# Patient Record
Sex: Female | Born: 1970
Health system: Southern US, Community
[De-identification: ages and names within clinical notes are randomized; demographics above are authoritative.]

## PROBLEM LIST (undated history)

## (undated) DIAGNOSIS — R112 Nausea with vomiting, unspecified: Secondary | ICD-10-CM

## (undated) DIAGNOSIS — E079 Disorder of thyroid, unspecified: Secondary | ICD-10-CM

## (undated) DIAGNOSIS — E785 Hyperlipidemia, unspecified: Secondary | ICD-10-CM

## (undated) DIAGNOSIS — I700 Atherosclerosis of aorta: Secondary | ICD-10-CM

## (undated) DIAGNOSIS — Z973 Presence of spectacles and contact lenses: Secondary | ICD-10-CM

## (undated) DIAGNOSIS — C801 Malignant (primary) neoplasm, unspecified: Secondary | ICD-10-CM

## (undated) DIAGNOSIS — E039 Hypothyroidism, unspecified: Secondary | ICD-10-CM

## (undated) DIAGNOSIS — R011 Cardiac murmur, unspecified: Secondary | ICD-10-CM

## (undated) DIAGNOSIS — Z9889 Other specified postprocedural states: Secondary | ICD-10-CM

## (undated) DIAGNOSIS — R918 Other nonspecific abnormal finding of lung field: Secondary | ICD-10-CM

## (undated) DIAGNOSIS — M1990 Unspecified osteoarthritis, unspecified site: Secondary | ICD-10-CM

## (undated) HISTORY — PX: EYE SURGERY: SHX253

## (undated) HISTORY — PX: WISDOM TOOTH EXTRACTION: SHX21

---

## 2005-05-16 ENCOUNTER — Encounter (HOSPITAL_COMMUNITY): Admission: RE | Admit: 2005-05-16 | Discharge: 2005-08-14 | Payer: Self-pay | Admitting: Endocrinology

## 2011-08-19 ENCOUNTER — Other Ambulatory Visit: Payer: Self-pay

## 2011-08-19 ENCOUNTER — Emergency Department (HOSPITAL_BASED_OUTPATIENT_CLINIC_OR_DEPARTMENT_OTHER)
Admission: EM | Admit: 2011-08-19 | Discharge: 2011-08-20 | Disposition: A | Discharge status: Home / Self Care | Payer: 59 | Attending: Emergency Medicine | Admitting: Emergency Medicine

## 2011-08-19 DIAGNOSIS — E079 Disorder of thyroid, unspecified: Secondary | ICD-10-CM | POA: Insufficient documentation

## 2011-08-19 DIAGNOSIS — E0590 Thyrotoxicosis, unspecified without thyrotoxic crisis or storm: Secondary | ICD-10-CM

## 2011-08-19 DIAGNOSIS — R002 Palpitations: Secondary | ICD-10-CM

## 2011-08-19 HISTORY — DX: Disorder of thyroid, unspecified: E07.9

## 2011-08-19 LAB — DIFFERENTIAL
Basophils Absolute: 0 10*3/uL (ref 0.0–0.1)
Eosinophils Absolute: 0.1 10*3/uL (ref 0.0–0.7)
Eosinophils Relative: 1 % (ref 0–5)
Lymphocytes Relative: 37 % (ref 12–46)
Lymphs Abs: 3.2 10*3/uL (ref 0.7–4.0)
Monocytes Absolute: 0.5 10*3/uL (ref 0.1–1.0)
Monocytes Relative: 6 % (ref 3–12)
Neutro Abs: 4.9 10*3/uL (ref 1.7–7.7)

## 2011-08-19 LAB — CBC
HCT: 35.4 % — ABNORMAL LOW (ref 36.0–46.0)
Hemoglobin: 11.9 g/dL — ABNORMAL LOW (ref 12.0–15.0)
MCH: 28.6 pg (ref 26.0–34.0)
RDW: 12.4 % (ref 11.5–15.5)
WBC: 8.7 10*3/uL (ref 4.0–10.5)

## 2011-08-19 LAB — BASIC METABOLIC PANEL
BUN: 11 mg/dL (ref 6–23)
GFR calc Af Amer: >90 mL/min (ref >90)

## 2011-08-19 NOTE — ED Notes (Signed)
C/o heart palpitations x 1 week-worse today-denies pain "just annoying"-hx of tx for hyperthyroid then hypothyroid

## 2011-08-19 NOTE — ED Provider Notes (Addendum)
History     CSN: [id number] Arrival date & time: 08/19/2011  8:54 PM   First MD Initiated Contact with Patient 08/19/11 2136      Chief Complaint  Patient presents with  . Palpitations    HPI  H/o hyperthyroidism s/p radioactive iodine therapy--> hypothyroidism on synthroid pw palpitations. Pt c/o palpitations x 1 week. Was seen by endo and TSH was low at that time. Decreased dose of synthroid. States that tonight her palpitations became worse but better now. Denies anxiety, diaphoresis, tremors. She reports recent 26lbs wt loss-- diet and exercise. Denies fever/chills, vomiting, nausea, vomiting, back pain. Denies h/o VTE in self or family. No recent hosp/surg/immob. No h/o cancer. Denies exogenous hormone use, no leg pain or swelling.  Past Medical History  Diagnosis Date  . Thyroid disease     History reviewed. No pertinent past surgical history.  No family history on file.  History  Substance Use Topics  . Smoking status: Never Smoker   . Smokeless tobacco: Not on file  . Alcohol Use: No    OB History    Grav Para Term Preterm Abortions TAB SAB Ect Mult Living                  Review of Systems  All other systems reviewed and are negative.   except as noted HPI  Allergies  Review of patient's allergies indicates no known allergies.  Home Medications  No current outpatient prescriptions on file.  BP 139/90  Pulse 79  Temp(Src) 98.4 F (36.9 C) (Oral)  Resp 20  Ht 5\' 4"  (1.626 m)  Wt 157 lb (71.215 kg)  BMI 26.95 kg/m2  SpO2 99%  LMP 08/18/2011  Physical Exam  Nursing note and vitals reviewed. Constitutional: She is oriented to person, place, and time. She appears well-developed.  HENT:  Head: Atraumatic.  Mouth/Throat: Oropharynx is clear and moist.  Eyes: Conjunctivae and EOM are normal. Pupils are equal, round, and reactive to light.  Neck: Normal range of motion. Neck supple.  Cardiovascular: Normal rate, regular rhythm, normal heart sounds  and intact distal pulses.   Pulmonary/Chest: Effort normal and breath sounds normal. No respiratory distress. She has no wheezes. She has no rales.  Abdominal: Soft. She exhibits no distension. There is no tenderness. There is no rebound and no guarding.  Musculoskeletal: Normal range of motion.  Neurological: She is alert and oriented to person, place, and time.  Skin: Skin is warm and dry. No rash noted.  Psychiatric: She has a normal mood and affect.    Date: 08/19/2011  Rate: 86  Rhythm: sinus arrhythmia  QRS Axis: normal  Intervals: normal  ST/T Wave abnormalities: normal  Conduction Disutrbances:none  Narrative Interpretation:   Old EKG Reviewed: none available  ED Course  Procedures (including critical care time)  Labs Reviewed  CBC - Abnormal; Notable for the following:    Hemoglobin 11.9 (*)    HCT 35.4 (*)    All other components within normal limits  BASIC METABOLIC PANEL - Abnormal; Notable for the following:    Glucose, Bld 105 (*)    All other components within normal limits  DIFFERENTIAL  PREGNANCY, URINE  T4, FREE   No results found.   1. Palpitations   2. Hyperthyroidism     MDM  Hyperthyroidism, palpitations. PERC negative for DVT. EKG without tachycardia. Precautions for return. Free T4 pending. F/U Endocrinology. BP improved without intervention.  BP 139/90  Pulse 79  Temp(Src) 98.4 F (36.9  C) (Oral)  Resp 20  Ht 5\' 4"  (1.626 m)  Wt 157 lb (71.215 kg)  BMI 26.95 kg/m2  SpO2 99%  LMP 08/18/2011   Stefano Gaul, MD         Forbes Cellar, MD 08/19/11 2358  Forbes Cellar, MD 08/19/11 2359

## 2012-09-26 DIAGNOSIS — H3589 Other specified retinal disorders: Secondary | ICD-10-CM

## 2012-09-26 DIAGNOSIS — C801 Malignant (primary) neoplasm, unspecified: Secondary | ICD-10-CM

## 2012-09-26 HISTORY — DX: Other specified retinal disorders: H35.89

## 2012-09-26 HISTORY — DX: Malignant (primary) neoplasm, unspecified: C80.1

## 2012-09-26 HISTORY — PX: EYE SURGERY: SHX253

## 2013-03-22 ENCOUNTER — Encounter (INDEPENDENT_AMBULATORY_CARE_PROVIDER_SITE_OTHER): Payer: 59 | Admitting: Ophthalmology

## 2013-03-22 DIAGNOSIS — H2510 Age-related nuclear cataract, unspecified eye: Secondary | ICD-10-CM

## 2013-03-22 DIAGNOSIS — S0550XA Penetrating wound with foreign body of unspecified eyeball, initial encounter: Secondary | ICD-10-CM

## 2013-03-22 DIAGNOSIS — C6930 Malignant neoplasm of unspecified choroid: Secondary | ICD-10-CM

## 2013-03-22 DIAGNOSIS — H33309 Unspecified retinal break, unspecified eye: Secondary | ICD-10-CM

## 2013-03-22 DIAGNOSIS — H43819 Vitreous degeneration, unspecified eye: Secondary | ICD-10-CM

## 2013-03-27 ENCOUNTER — Other Ambulatory Visit: Payer: Self-pay | Admitting: Internal Medicine

## 2013-03-27 ENCOUNTER — Other Ambulatory Visit (HOSPITAL_COMMUNITY): Payer: Self-pay | Admitting: Internal Medicine

## 2013-03-27 ENCOUNTER — Ambulatory Visit (HOSPITAL_COMMUNITY)
Admission: RE | Admit: 2013-03-27 | Discharge: 2013-03-27 | Disposition: A | Discharge status: Home / Self Care | Payer: 59 | Source: Ambulatory Visit | Attending: Internal Medicine | Admitting: Internal Medicine

## 2013-03-27 DIAGNOSIS — C439 Malignant melanoma of skin, unspecified: Secondary | ICD-10-CM

## 2013-03-27 DIAGNOSIS — C6930 Malignant neoplasm of unspecified choroid: Secondary | ICD-10-CM

## 2013-03-27 DIAGNOSIS — C4359 Malignant melanoma of other part of trunk: Secondary | ICD-10-CM | POA: Insufficient documentation

## 2013-04-01 ENCOUNTER — Ambulatory Visit (INDEPENDENT_AMBULATORY_CARE_PROVIDER_SITE_OTHER): Payer: 59 | Admitting: Ophthalmology

## 2013-04-01 ENCOUNTER — Ambulatory Visit
Admission: RE | Admit: 2013-04-01 | Discharge: 2013-04-01 | Disposition: A | Discharge status: Home / Self Care | Payer: 59 | Source: Ambulatory Visit | Attending: Internal Medicine | Admitting: Internal Medicine

## 2013-04-01 ENCOUNTER — Other Ambulatory Visit: Payer: Self-pay | Admitting: Internal Medicine

## 2013-04-01 DIAGNOSIS — C6930 Malignant neoplasm of unspecified choroid: Secondary | ICD-10-CM

## 2013-04-01 DIAGNOSIS — D219 Benign neoplasm of connective and other soft tissue, unspecified: Secondary | ICD-10-CM

## 2013-04-01 MED ORDER — IOHEXOL 300 MG/ML  SOLN
100.0000 mL | Freq: Once | INTRAMUSCULAR | Status: AC | PRN
  Administered 2013-04-01: 100 mL via INTRAVENOUS

## 2013-04-02 ENCOUNTER — Ambulatory Visit (INDEPENDENT_AMBULATORY_CARE_PROVIDER_SITE_OTHER): Payer: 59 | Admitting: Ophthalmology

## 2013-04-03 ENCOUNTER — Other Ambulatory Visit: Payer: Self-pay | Admitting: Internal Medicine

## 2013-04-03 DIAGNOSIS — C6932 Malignant neoplasm of left choroid: Secondary | ICD-10-CM

## 2013-04-04 ENCOUNTER — Ambulatory Visit (INDEPENDENT_AMBULATORY_CARE_PROVIDER_SITE_OTHER): Payer: 59 | Admitting: Ophthalmology

## 2013-04-05 ENCOUNTER — Ambulatory Visit (INDEPENDENT_AMBULATORY_CARE_PROVIDER_SITE_OTHER): Payer: 59 | Admitting: Ophthalmology

## 2013-04-05 DIAGNOSIS — H33309 Unspecified retinal break, unspecified eye: Secondary | ICD-10-CM

## 2013-04-06 ENCOUNTER — Ambulatory Visit
Admission: RE | Admit: 2013-04-06 | Discharge: 2013-04-06 | Disposition: A | Discharge status: Home / Self Care | Payer: 59 | Source: Ambulatory Visit | Attending: Internal Medicine | Admitting: Internal Medicine

## 2013-04-06 DIAGNOSIS — D219 Benign neoplasm of connective and other soft tissue, unspecified: Secondary | ICD-10-CM

## 2013-04-06 MED ORDER — GADOBENATE DIMEGLUMINE 529 MG/ML IV SOLN
14.0000 mL | Freq: Once | INTRAVENOUS | Status: AC | PRN
  Administered 2013-04-06: 14 mL via INTRAVENOUS

## 2013-04-07 ENCOUNTER — Ambulatory Visit
Admission: RE | Admit: 2013-04-07 | Discharge: 2013-04-07 | Disposition: A | Discharge status: Home / Self Care | Payer: 59 | Source: Ambulatory Visit | Attending: Internal Medicine | Admitting: Internal Medicine

## 2013-04-07 DIAGNOSIS — C6932 Malignant neoplasm of left choroid: Secondary | ICD-10-CM

## 2013-04-07 MED ORDER — GADOBENATE DIMEGLUMINE 529 MG/ML IV SOLN
14.0000 mL | Freq: Once | INTRAVENOUS | Status: AC | PRN
  Administered 2013-04-07: 14 mL via INTRAVENOUS

## 2013-04-18 DIAGNOSIS — F40240 Claustrophobia: Secondary | ICD-10-CM | POA: Insufficient documentation

## 2013-04-18 DIAGNOSIS — E890 Postprocedural hypothyroidism: Secondary | ICD-10-CM | POA: Insufficient documentation

## 2013-08-09 ENCOUNTER — Ambulatory Visit (INDEPENDENT_AMBULATORY_CARE_PROVIDER_SITE_OTHER): Payer: 59 | Admitting: Ophthalmology

## 2013-08-09 DIAGNOSIS — C6990 Malignant neoplasm of unspecified site of unspecified eye: Secondary | ICD-10-CM

## 2013-08-09 DIAGNOSIS — H43819 Vitreous degeneration, unspecified eye: Secondary | ICD-10-CM

## 2013-08-09 DIAGNOSIS — H33309 Unspecified retinal break, unspecified eye: Secondary | ICD-10-CM

## 2013-08-09 DIAGNOSIS — H2510 Age-related nuclear cataract, unspecified eye: Secondary | ICD-10-CM

## 2013-08-27 ENCOUNTER — Encounter (HOSPITAL_COMMUNITY): Payer: Self-pay

## 2013-09-03 DIAGNOSIS — L409 Psoriasis, unspecified: Secondary | ICD-10-CM | POA: Insufficient documentation

## 2013-09-09 ENCOUNTER — Encounter (HOSPITAL_COMMUNITY): Payer: Self-pay | Admitting: Pharmacist

## 2013-09-10 NOTE — H&P (Signed)
Tracy Kidd is an 42 y.o. female. G0 who comes in for scheduled laparoscopy for a persistent pelvic mass noted since 7/14.  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  On the MRI, a cystic mass was noted in the right pelvis which radiologically favored an endometrioma.  The patient has been followed carefully with serial ultrasounds and the mass has remained stable in size, but cannot be definitively defined.  Her periods have been for the most part regular, every 28 days.  She did have one abnormal cycle in October, but that resolved.  She has no pelvic pain from the mass or her other multiple fibroids.  A SIUS ruled out any intracavitary fibroids. She has not had children by choice.   Pertinent Gynecological History: OB History: none   Menstrual History:  Patient's last menstrual period was 08/06/2013.    Past Medical History  Diagnosis Date  . Thyroid disease   . Hypothyroidism   . Arthritis     hands and feet  . PONV (postoperative nausea and vomiting)     past surgery Scop patch and propofol helped  . Cancer     ocular melanoma    Past Surgical History  Procedure Laterality Date  . Wisdom tooth extraction    . Eye surgery      radiation plaque placement and removal    History reviewed. No pertinent family history.  Social History:  reports that she has never smoked. She does not have any smokeless tobacco history on file. She reports that she drinks alcohol. She reports that she does not use illicit drugs.  Allergies:  Allergies  Allergen Reactions  . Oxycodone Nausea And Vomiting    Can take morphine and possibly Vicodin    No prescriptions prior to admission    Review of Systems  Constitutional: Negative for fever.  Genitourinary: Negative for dysuria.    Height 5\' 3"  (1.6 m), weight 69.4 kg (153 lb), last menstrual period 08/06/2013. Physical Exam  Constitutional: She is oriented to person, place, and time.  She appears well-developed and well-nourished.  Cardiovascular: Normal rate and regular rhythm.   Respiratory: Effort normal.  GI: Soft. Bowel sounds are normal.  Genitourinary: Vagina normal and uterus normal.  Neurological: She is alert and oriented to person, place, and time.  Psychiatric: She has a normal mood and affect.    No results found for this or any previous visit (from the past 24 hour(s)).  No results found.  Assessment/Plan: d/w pt her laparoscopy in detail. d/w her risks and benefits including bleeding, infection, and possible damage to bowel and bladder with possible prolonged recovery and need for a larger incision should a complication arise. If endometriosis is identified, we will try to cauterize and remove it at time of surgery. We discussed it is not clear what the pelvic mass represents, if it is an atypical pedunculated fibroid we will likely leave in place. If ovarian or tubal in nature, we will remove. Pt desires to proceed   Oliver Pila 09/10/2013, 7:05 PM

## 2013-09-13 ENCOUNTER — Encounter (HOSPITAL_COMMUNITY)
Admission: RE | Disposition: A | Discharge status: Home / Self Care | Payer: Self-pay | Source: Ambulatory Visit | Attending: Obstetrics and Gynecology

## 2013-09-13 ENCOUNTER — Encounter (HOSPITAL_COMMUNITY): Payer: Self-pay | Admitting: *Deleted

## 2013-09-13 ENCOUNTER — Ambulatory Visit (HOSPITAL_COMMUNITY): Payer: 59 | Admitting: Anesthesiology

## 2013-09-13 ENCOUNTER — Ambulatory Visit (HOSPITAL_COMMUNITY)
Admission: RE | Admit: 2013-09-13 | Discharge: 2013-09-13 | Disposition: A | Discharge status: Home / Self Care | Payer: 59 | Source: Ambulatory Visit | Attending: Obstetrics and Gynecology | Admitting: Obstetrics and Gynecology

## 2013-09-13 ENCOUNTER — Encounter (HOSPITAL_COMMUNITY): Payer: 59 | Admitting: Anesthesiology

## 2013-09-13 DIAGNOSIS — C7250 Malignant neoplasm of unspecified cranial nerve: Secondary | ICD-10-CM | POA: Insufficient documentation

## 2013-09-13 DIAGNOSIS — D259 Leiomyoma of uterus, unspecified: Secondary | ICD-10-CM | POA: Insufficient documentation

## 2013-09-13 DIAGNOSIS — R1900 Intra-abdominal and pelvic swelling, mass and lump, unspecified site: Secondary | ICD-10-CM | POA: Insufficient documentation

## 2013-09-13 DIAGNOSIS — N808 Other endometriosis: Secondary | ICD-10-CM | POA: Insufficient documentation

## 2013-09-13 HISTORY — DX: Unspecified osteoarthritis, unspecified site: M19.90

## 2013-09-13 HISTORY — DX: Nausea with vomiting, unspecified: R11.2

## 2013-09-13 HISTORY — DX: Hypothyroidism, unspecified: E03.9

## 2013-09-13 HISTORY — DX: Malignant (primary) neoplasm, unspecified: C80.1

## 2013-09-13 HISTORY — PX: LAPAROSCOPY: SHX197

## 2013-09-13 HISTORY — DX: Other specified postprocedural states: Z98.890

## 2013-09-13 LAB — CBC
MCHC: 33.6 g/dL (ref 30.0–36.0)
Platelets: 253 10*3/uL (ref 150–400)
RDW: 12.6 % (ref 11.5–15.5)

## 2013-09-13 SURGERY — LAPAROSCOPY OPERATIVE
Anesthesia: General | Site: Abdomen

## 2013-09-13 MED ORDER — METHYLENE BLUE 1 % INJ SOLN
INTRAMUSCULAR | Status: AC
  Filled 2013-09-13: qty 10

## 2013-09-13 MED ORDER — FENTANYL CITRATE 0.05 MG/ML IJ SOLN
25.0000 ug | INTRAMUSCULAR | Status: DC | PRN
  Administered 2013-09-13: 50 ug via INTRAVENOUS
  Administered 2013-09-13: 25 ug via INTRAVENOUS

## 2013-09-13 MED ORDER — FENTANYL CITRATE 0.05 MG/ML IJ SOLN
INTRAMUSCULAR | Status: AC
  Filled 2013-09-13: qty 2

## 2013-09-13 MED ORDER — BUPIVACAINE HCL (PF) 0.25 % IJ SOLN
INTRAMUSCULAR | Status: DC | PRN
  Administered 2013-09-13: 14 mL

## 2013-09-13 MED ORDER — LACTATED RINGERS IV SOLN: INTRAVENOUS | Status: DC

## 2013-09-13 MED ORDER — SCOPOLAMINE 1 MG/3DAYS TD PT72
MEDICATED_PATCH | TRANSDERMAL | Status: AC
  Filled 2013-09-13: qty 1

## 2013-09-13 MED ORDER — IBUPROFEN 200 MG PO TABS: 600.0000 mg | ORAL_TABLET | Freq: Four times a day (QID) | ORAL | Status: DC | PRN

## 2013-09-13 MED ORDER — ONDANSETRON HCL 4 MG/2ML IJ SOLN
INTRAMUSCULAR | Status: DC | PRN
  Administered 2013-09-13: 4 mg via INTRAVENOUS

## 2013-09-13 MED ORDER — HEPARIN SODIUM (PORCINE) 5000 UNIT/ML IJ SOLN
INTRAMUSCULAR | Status: AC
  Filled 2013-09-13: qty 1

## 2013-09-13 MED ORDER — PROPOFOL 10 MG/ML IV BOLUS
INTRAVENOUS | Status: DC | PRN
  Administered 2013-09-13: 200 mg via INTRAVENOUS

## 2013-09-13 MED ORDER — KETOROLAC TROMETHAMINE 30 MG/ML IJ SOLN
INTRAMUSCULAR | Status: DC | PRN
  Administered 2013-09-13: 30 mg via INTRAVENOUS

## 2013-09-13 MED ORDER — SODIUM CHLORIDE 0.9 % IJ SOLN
INTRAMUSCULAR | Status: AC
  Filled 2013-09-13: qty 10

## 2013-09-13 MED ORDER — METOCLOPRAMIDE HCL 5 MG/ML IJ SOLN
INTRAMUSCULAR | Status: AC
  Filled 2013-09-13: qty 2

## 2013-09-13 MED ORDER — FENTANYL CITRATE 0.05 MG/ML IJ SOLN
INTRAMUSCULAR | Status: AC
  Filled 2013-09-13: qty 5

## 2013-09-13 MED ORDER — DEXAMETHASONE SODIUM PHOSPHATE 10 MG/ML IJ SOLN
INTRAMUSCULAR | Status: DC | PRN
  Administered 2013-09-13: 10 mg via INTRAVENOUS

## 2013-09-13 MED ORDER — BUPIVACAINE HCL (PF) 0.25 % IJ SOLN
INTRAMUSCULAR | Status: AC
  Filled 2013-09-13: qty 30

## 2013-09-13 MED ORDER — GLYCOPYRROLATE 0.2 MG/ML IJ SOLN
INTRAMUSCULAR | Status: DC | PRN
  Administered 2013-09-13: 0.4 mg via INTRAVENOUS
  Administered 2013-09-13: .2 mg via INTRAVENOUS

## 2013-09-13 MED ORDER — LIDOCAINE HCL (CARDIAC) 20 MG/ML IV SOLN
INTRAVENOUS | Status: DC | PRN
  Administered 2013-09-13: 100 mg via INTRAVENOUS

## 2013-09-13 MED ORDER — MIDAZOLAM HCL 2 MG/2ML IJ SOLN
INTRAMUSCULAR | Status: AC
  Filled 2013-09-13: qty 2

## 2013-09-13 MED ORDER — MEPERIDINE HCL 25 MG/ML IJ SOLN: 6.2500 mg | INTRAMUSCULAR | Status: DC | PRN

## 2013-09-13 MED ORDER — MIDAZOLAM HCL 2 MG/2ML IJ SOLN
INTRAMUSCULAR | Status: DC | PRN
  Administered 2013-09-13: 2 mg via INTRAVENOUS

## 2013-09-13 MED ORDER — FENTANYL CITRATE 0.05 MG/ML IJ SOLN
INTRAMUSCULAR | Status: DC | PRN
  Administered 2013-09-13 (×2): 100 ug via INTRAVENOUS
  Administered 2013-09-13 (×2): 50 ug via INTRAVENOUS

## 2013-09-13 MED ORDER — LACTATED RINGERS IV SOLN
INTRAVENOUS | Status: DC
  Administered 2013-09-13 (×4): via INTRAVENOUS

## 2013-09-13 MED ORDER — NEOSTIGMINE METHYLSULFATE 1 MG/ML IJ SOLN
INTRAMUSCULAR | Status: DC | PRN
  Administered 2013-09-13 (×2): 2 mg via INTRAVENOUS

## 2013-09-13 MED ORDER — HYDROCODONE-ACETAMINOPHEN 5-325 MG PO TABS: 1.0000 | ORAL_TABLET | Freq: Four times a day (QID) | ORAL | Status: DC | PRN

## 2013-09-13 MED ORDER — FENTANYL CITRATE 0.05 MG/ML IJ SOLN
INTRAMUSCULAR | Status: AC
  Administered 2013-09-13: 25 ug via INTRAVENOUS
  Filled 2013-09-13: qty 2

## 2013-09-13 MED ORDER — ROCURONIUM BROMIDE 100 MG/10ML IV SOLN
INTRAVENOUS | Status: DC | PRN
  Administered 2013-09-13: 40 mg via INTRAVENOUS

## 2013-09-13 MED ORDER — METOCLOPRAMIDE HCL 5 MG/ML IJ SOLN
10.0000 mg | Freq: Once | INTRAMUSCULAR | Status: AC | PRN
  Administered 2013-09-13: 10 mg via INTRAVENOUS

## 2013-09-13 SURGICAL SUPPLY — 31 items
CABLE HIGH FREQUENCY MONO STRZ (ELECTRODE) IMPLANT
CATH ROBINSON RED A/P 16FR (CATHETERS) IMPLANT
CHLORAPREP W/TINT 26ML (MISCELLANEOUS) ×2 IMPLANT
CLOTH BEACON ORANGE TIMEOUT ST (SAFETY) ×2 IMPLANT
DECANTER SPIKE VIAL GLASS SM (MISCELLANEOUS) ×2 IMPLANT
DERMABOND ADVANCED (GAUZE/BANDAGES/DRESSINGS) ×1
DERMABOND ADVANCED .7 DNX12 (GAUZE/BANDAGES/DRESSINGS) ×1 IMPLANT
DRSG COVADERM PLUS 2X2 (GAUZE/BANDAGES/DRESSINGS) ×2 IMPLANT
GLOVE BIO SURGEON STRL SZ 6.5 (GLOVE) ×2 IMPLANT
GLOVE BIO SURGEON STRL SZ8 (GLOVE) ×2 IMPLANT
GOWN PREVENTION PLUS LG XLONG (DISPOSABLE) ×4 IMPLANT
NEEDLE INSUFFLATION 120MM (ENDOMECHANICALS) ×2 IMPLANT
NS IRRIG 1000ML POUR BTL (IV SOLUTION) IMPLANT
PACK LAPAROSCOPY BASIN (CUSTOM PROCEDURE TRAY) ×2 IMPLANT
POUCH SPECIMEN RETRIEVAL 10MM (ENDOMECHANICALS) ×2 IMPLANT
PROTECTOR NERVE ULNAR (MISCELLANEOUS) ×2 IMPLANT
SCALPEL HARMONIC ACE (MISCELLANEOUS) ×2 IMPLANT
SET IRRIG TUBING LAPAROSCOPIC (IRRIGATION / IRRIGATOR) ×2 IMPLANT
SOLUTION ELECTROLUBE (MISCELLANEOUS) IMPLANT
SUT VIC AB 2-0 SH 27 (SUTURE) ×1
SUT VIC AB 2-0 SH 27XBRD (SUTURE) ×1 IMPLANT
SUT VIC AB 3-0 PS2 18 (SUTURE) ×2
SUT VIC AB 3-0 PS2 18XBRD (SUTURE) ×2 IMPLANT
SUT VICRYL 0 UR6 27IN ABS (SUTURE) ×2 IMPLANT
TOWEL OR 17X24 6PK STRL BLUE (TOWEL DISPOSABLE) ×4 IMPLANT
TRAY FOLEY CATH 14FR (SET/KITS/TRAYS/PACK) ×2 IMPLANT
TROCAR XCEL NON-BLD 11X100MML (ENDOMECHANICALS) IMPLANT
TROCAR XCEL NON-BLD 5MMX100MML (ENDOMECHANICALS) ×2 IMPLANT
TROCAR XCEL OPT SLVE 5M 100M (ENDOMECHANICALS) ×2 IMPLANT
WARMER LAPAROSCOPE (MISCELLANEOUS) ×2 IMPLANT
WATER STERILE IRR 1000ML POUR (IV SOLUTION) ×2 IMPLANT

## 2013-09-13 NOTE — Brief Op Note (Signed)
09/13/2013  12:51 PM  PATIENT:  Noland Fordyce  42 y.o. female  PRE-OPERATIVE DIAGNOSIS:  Pelvic mass  POST-OPERATIVE DIAGNOSIS:  Pelvic mass  PROCEDURE:  Procedure(s): LAPAROSCOPY OPERATIVE, removal of pelvic mass (N/A)  SURGEON:  Surgeon(s) and Role:    * Oliver Pila, MD - Primary      Lavina Hamman, MD  ANESTHESIA:   local and general  EBL:  Total I/O In: 1500 [I.V.:1500] Out: 150 [Urine:125; Blood:25]  BLOOD ADMINISTERED:none  DRAINS: Urinary Catheter (Foley)   LOCAL MEDICATIONS USED:  MARCAINE     SPECIMEN:  Pelvic mass  DISPOSITION OF SPECIMEN:  PATHOLOGY  COUNTS:  YES  TOURNIQUET:  * No tourniquets in log *  DICTATION: .Dragon Dictation  PLAN OF CARE: Discharge to home after PACU  PATIENT DISPOSITION:  PACU - hemodynamically stable.

## 2013-09-13 NOTE — Anesthesia Procedure Notes (Signed)
Procedure Name: Intubation Date/Time: 09/13/2013 10:37 AM Performed by: Faelynn Wynder, Jannet Askew Pre-anesthesia Checklist: Patient identified, Emergency Drugs available, Timeout performed, Suction available and Patient being monitored Patient Re-evaluated:Patient Re-evaluated prior to inductionOxygen Delivery Method: Circle system utilized Preoxygenation: Pre-oxygenation with 100% oxygen Intubation Type: IV induction Ventilation: Mask ventilation without difficulty Grade View: Grade I Tube size: 7.0 mm Number of attempts: 1 Placement Confirmation: ETT inserted through vocal cords under direct vision,  breath sounds checked- equal and bilateral and positive ETCO2 Secured at: 31 cm Dental Injury: Teeth and Oropharynx as per pre-operative assessment

## 2013-09-13 NOTE — Anesthesia Postprocedure Evaluation (Signed)
  Anesthesia Post-op Note  Patient: Tracy Kidd  Procedure(s) Performed: Procedure(s): LAPAROSCOPY OPERATIVE, removal of pelvic mass (N/A)  Patient Location: PACU  Anesthesia Type:General  Level of Consciousness: awake, alert  and oriented  Airway and Oxygen Therapy: Patient Spontanous Breathing  Post-op Pain: none  Post-op Assessment: Post-op Vital signs reviewed, Patient's Cardiovascular Status Stable, Respiratory Function Stable, Patent Airway, No signs of Nausea or vomiting and Pain level controlled  Post-op Vital Signs: Reviewed and stable  Complications: No apparent anesthesia complications

## 2013-09-13 NOTE — Op Note (Signed)
Operative note  Preoperative diagnosis Pelvic mass  Postoperative diagnosis Pedunculated fibroid with endometrioma by clinical impression  Procedure Operative laparoscopy with resection of pelvic mass  Surgeon Dr. Huel Cote Dr. Tawanna Cooler Meisinger  Anesthesia Gen.  Fluids Estimated blood loss minimal Urine output 100 cc clear IV fluids 1200 cc LR  Findings There was a pedunculated mass coming off the right anterior surface of the uterus. This then appeared both cystic and solid in nature. The clinical impression was a pedunculated fibroid with endometrioma that was adherent to the anterior, wall. Bilateral ovaries and tubes appeared normal. The cul-de-sac was clear of disease. The abdomen and pelvis otherwise appeared normal except for some mild colonic adhesions to the left pelvic sidewall.  Specimen Pelvic mass resected and sent to pathology  Procedure Patient was taken to the operating room where general anesthesia was obtained without difficulty. She was prepped and draped in the normal sterile fashion in the dorsal lithotomy position. An appropriate time out was performed. A speculum was placed in the vagina the acorn tenaculum placed within the uterus. Attention was then turned to the patient's abdomen where an infraumbilical incision was made with the scalpel and ANA varies needle introduced into the incision. Intraperitoneal placement was confirmed with both aspiration and injection with normal saline. Gas was then applied and pneumoperitoneum obtained with approximately 2 and half liters of CO2 gas. Varies needle was then removed and the 11 trocar placed within the incision using Opti-Vu visualization. With the camera in the abdomen and the pelvis and abdomen were inspected with findings as previously stated. 2 additional 5 mm ports were placed in the upper lateral quadrants under direct visualization. Each port site was injected with quarter percent Marcaine before  placement. With the instruments in place pelvic mass was resected off the anterior abdominal wall with the harmonic scalpel. In freeing it from the abdominal wall there was a rupture of a chocolate cyst with apparent endometrioma fluid released into the abdomen. The remainder of the mass stayed intact. And the mass was also attached to the uterus from a pedunculated stalk and this was taken down with the harmonic scalpel as well to completely free the mass. Once this was completed the pelvis was copiously irrigated and the endometrioma fluid suctioned out. The uterus was inspected and no active bleeding noted from the site of removal. The cul-de-sac and the remainder of the pelvis was normal. Therefore the scope was changed to 5 mm and an Endobag introduced into the umbilical port. The mass was placed within the Endobag without difficulty and it was brought up to the level of the umbilicus. The trocar was removed and the bag pulled through the umbilical port site. Because of the solid component of the mass it could not be completely removed intact. The mass was morcellated within the bag partially and was then able to be pulled through the port site where it was removed from just underneath the skin with a towel clip. This was handed off to pathology. Attention was then turned to the umbilical port site where the skin incision had to be extended slightly in order to adequately close the fascia. The fascia was closed with 0 Vicryl in a pursestring fashion while watching with the 5 mm scope. The subcutaneous tissue was reapproximated with 2-0 Vicryl in a running fashion. The skin was closed with 4-0 Vicryl in a subcuticular stitch. The other ports were removed under direct visualization and the pneumoperitoneum reduced through the final port before its  removal. These port sites were then likewise closed with a subcuticular stitch of 4-0 Vicryl and reinforced with Dermabond. The acorn tenaculum was removed from the  vagina and the patient was awakened and taken to the recovery room in good condition. All instruments and sponge counts were correct.

## 2013-09-13 NOTE — Progress Notes (Signed)
Patient ID: Tracy Kidd, female   DOB: 04-07-1971, 42 y.o.   MRN: [id number] Per pt no changes in dictated H&P, brief exam WNL.

## 2013-09-13 NOTE — Anesthesia Preprocedure Evaluation (Signed)
Anesthesia Evaluation  Patient identified by MRN, date of birth, ID band Patient awake    Reviewed: Allergy & Precautions, H&P , NPO status , Patient's Chart, lab work & pertinent test results  History of Anesthesia Complications (+) PONV and history of anesthetic complications  Airway Mallampati: II TM Distance: >3 FB Neck ROM: Full    Dental no notable dental hx. (+) Teeth Intact   Pulmonary neg pulmonary ROS,  breath sounds clear to auscultation  Pulmonary exam normal       Cardiovascular negative cardio ROS  Rhythm:Regular Rate:Normal     Neuro/Psych negative neurological ROS  negative psych ROS   GI/Hepatic negative GI ROS, Neg liver ROS,   Endo/Other  Hypothyroidism   Renal/GU negative Renal ROS  negative genitourinary   Musculoskeletal negative musculoskeletal ROS (+)   Abdominal   Peds  Hematology negative hematology ROS (+)   Anesthesia Other Findings   Reproductive/Obstetrics Pelvic Mass                           Anesthesia Physical Anesthesia Plan  ASA: II  Anesthesia Plan: General   Post-op Pain Management:    Induction: Intravenous  Airway Management Planned: Natural Airway  Additional Equipment:   Intra-op Plan:   Post-operative Plan: Extubation in OR  Informed Consent: I have reviewed the patients History and Physical, chart, labs and discussed the procedure including the risks, benefits and alternatives for the proposed anesthesia with the patient or authorized representative who has indicated his/her understanding and acceptance.   Dental advisory given  Plan Discussed with: CRNA, Anesthesiologist and Surgeon  Anesthesia Plan Comments:         Anesthesia Quick Evaluation

## 2013-09-13 NOTE — Transfer of Care (Signed)
Immediate Anesthesia Transfer of Care Note  Patient: Tracy Kidd  Procedure(s) Performed: Procedure(s): LAPAROSCOPY OPERATIVE, removal of pelvic mass (N/A)  Patient Location: PACU  Anesthesia Type:General  Level of Consciousness: awake, alert  and oriented  Airway & Oxygen Therapy: Patient Spontanous Breathing and Patient connected to nasal cannula oxygen  Post-op Assessment: Report given to PACU RN and Post -op Vital signs reviewed and stable  Post vital signs: Reviewed and stable  Complications: No apparent anesthesia complications

## 2013-09-16 ENCOUNTER — Encounter (HOSPITAL_COMMUNITY): Payer: Self-pay | Admitting: Obstetrics and Gynecology

## 2013-09-16 LAB — HCG, QUANTITATIVE, PREGNANCY: hCG, Beta Chain, Quant, S: <1 m[IU]/mL (ref <5)

## 2013-10-10 ENCOUNTER — Other Ambulatory Visit: Payer: Self-pay | Admitting: Dermatology

## 2013-12-10 DIAGNOSIS — T380X5A Adverse effect of glucocorticoids and synthetic analogues, initial encounter: Secondary | ICD-10-CM | POA: Insufficient documentation

## 2013-12-10 DIAGNOSIS — H4060X0 Glaucoma secondary to drugs, unspecified eye, stage unspecified: Secondary | ICD-10-CM | POA: Insufficient documentation

## 2014-01-26 DIAGNOSIS — C6930 Malignant neoplasm of unspecified choroid: Secondary | ICD-10-CM | POA: Insufficient documentation

## 2014-01-26 DIAGNOSIS — E039 Hypothyroidism, unspecified: Secondary | ICD-10-CM | POA: Insufficient documentation

## 2014-01-28 ENCOUNTER — Encounter: Payer: Self-pay | Admitting: Physician Assistant

## 2014-01-28 ENCOUNTER — Ambulatory Visit (INDEPENDENT_AMBULATORY_CARE_PROVIDER_SITE_OTHER): Payer: 59 | Admitting: Physician Assistant

## 2014-01-28 VITALS — BP 118/68 | HR 72 | Temp 98.1°F | Resp 16 | Wt 161.0 lb

## 2014-01-28 DIAGNOSIS — Z0000 Encounter for general adult medical examination without abnormal findings: Secondary | ICD-10-CM

## 2014-01-28 LAB — HEMOGLOBIN A1C
HEMOGLOBIN A1C: 5.2 % (ref <5.7)
Mean Plasma Glucose: 103 mg/dL (ref <117)

## 2014-01-28 LAB — CBC WITH DIFFERENTIAL/PLATELET
Basophils Absolute: 0 10*3/uL (ref 0.0–0.1)
Basophils Relative: 0 % (ref 0–1)
EOS ABS: 0.1 10*3/uL (ref 0.0–0.7)
Eosinophils Relative: 1 % (ref 0–5)
HCT: 36.9 % (ref 36.0–46.0)
HEMOGLOBIN: 12.4 g/dL (ref 12.0–15.0)
Lymphocytes Relative: 34 % (ref 12–46)
Lymphs Abs: 2.4 10*3/uL (ref 0.7–4.0)
MCH: 28.4 pg (ref 26.0–34.0)
MCHC: 33.6 g/dL (ref 30.0–36.0)
MCV: 84.4 fL (ref 78.0–100.0)
Monocytes Absolute: 0.4 10*3/uL (ref 0.1–1.0)
Monocytes Relative: 5 % (ref 3–12)
NEUTROS PCT: 60 % (ref 43–77)
Neutro Abs: 4.3 10*3/uL (ref 1.7–7.7)
Platelets: 288 10*3/uL (ref 150–400)
RBC: 4.37 MIL/uL (ref 3.87–5.11)
RDW: 13.6 % (ref 11.5–15.5)
WBC: 7.1 10*3/uL (ref 4.0–10.5)

## 2014-01-28 NOTE — Patient Instructions (Signed)

## 2014-01-28 NOTE — Progress Notes (Signed)
Complete Physical HPI 43 y.o. female  presents for a complete physical. Her blood pressure has been controlled at home, today their BP is BP: 118/68 mmHg She does workout, walking. She denies chest pain, shortness of breath, dizziness.  She is not on cholesterol medication and denies myalgias. Her cholesterol is at goal. The cholesterol last visit was:  LDL 97  Last A1C in the office was: 5.2 Patient is on Vitamin D supplement.  Vitamin D 41(26) , B12 306 She was diagnosed with ocular melanoma and has been seeing a specialist at [facility], she had radiation and recently had increased pressure and seeing Dr. Celene Squibb.  She is also seeing Dr. Martinique every 3 months for skin checks.  She had laparoscopic surgery in Dec 2014, with Dr. Marvel Plan for endometriom on her ovary.   Current Medications:  Current Outpatient Prescriptions on File Prior to Visit  Medication Sig Dispense Refill  . B Complex Vitamins (VITAMIN B COMPLEX PO) Take 1 tablet by mouth daily.      Marland Kitchen CALCIUM PO Take 1 tablet by mouth daily.      . cholecalciferol (VITAMIN D) 1000 UNITS tablet Take 2,000 Units by mouth daily.      . Cyanocobalamin (VITAMIN B-12 PO) Take 1 tablet by mouth daily.      . fexofenadine (ALLEGRA) 180 MG tablet Take 180 mg by mouth daily.      . fluocinonide cream (LIDEX) 6.29 % Apply 1 application topically daily as needed (for eczema flare ups).      . IRON PO Take 1 tablet by mouth daily.      Marland Kitchen levothyroxine (SYNTHROID, LEVOTHROID) 112 MCG tablet Take 112 mcg by mouth daily before breakfast.      . MELATONIN PO Take 1 tablet by mouth at bedtime.      . Multiple Vitamin (MULTIVITAMIN WITH MINERALS) TABS tablet Take 1 tablet by mouth daily.       No current facility-administered medications on file prior to visit.   Health Maintenance:   Immunization History  Administered Date(s) Administered  . Tdap 01/29/2007   Tetanus:2008 Pneumovax: N/A Flu vaccine: 2014 Zostavax: N/A Pap: 2013- every 3  years MGM: 12/2013 DEXA: N/A Colonoscopy: EGD:  Allergies:  Allergies  Allergen Reactions  . Oxycodone Nausea And Vomiting    Can take morphine and possibly Vicodin   Medical History:  Past Medical History  Diagnosis Date  . PONV (postoperative nausea and vomiting)     past surgery Scop patch and propofol helped  . Arthritis     hands and feet  . Cancer     ocular melanoma  . Thyroid disease   . Hypothyroidism    Surgical History:  Past Surgical History  Procedure Laterality Date  . Wisdom tooth extraction    . Eye surgery      radiation plaque placement and removal  . Laparoscopy N/A 09/13/2013    Procedure: LAPAROSCOPY OPERATIVE, removal of pelvic mass;  Surgeon: Logan Bores, MD;  Location: [facility];  Service: Gynecology;  Laterality: N/A;   Family History: No family history on file. Social History:  History  Substance Use Topics  . Smoking status: Never Smoker   . Smokeless tobacco: Not on file  . Alcohol Use: Yes     Comment: rare     Review of Systems: [X]  = complains of  [ ]  = denies  General: Fatigue [ ]  Fever [ ]  Chills [ ]  Weakness [ ]   Insomnia [ ] Weight change [ ]   Night sweats [ ]   Change in appetite [ ]  Eyes: Redness [ ]  Blurred vision [ ]  Diplopia [ ]  Discharge [ ]   ENT: Congestion [ ]  Sinus Pain [ ]  Post Nasal Drip [ ]  Sore Throat [ ]  Earache [ ]  hearing loss [ ]  Tinnitus [ ]  Snoring [ ]   Cardiac: Chest pain/pressure [ ]  SOB [ ]  Orthopnea [ ]   Palpitations [ ]   Paroxysmal nocturnal dyspnea[ ]  Claudication [ ]  Edema [ ]   Pulmonary: Cough [ ]  Wheezing[ ]   SOB [ ]   Pleurisy [ ]   GI: Nausea [ ]  Vomiting[ ]  Dysphagia[ ]  Heartburn[ ]  Abdominal pain [ ]  Constipation [ ] ; Diarrhea [ ]  BRBPR [ ]  Melena[ ]  Bloating [ ]  Hemorrhoids [ ]   GU: Hematuria[ ]  Dysuria [ ]  Nocturia[ ]  Urgency [ ]   Hesitancy [ ]  Discharge [ ]  Frequency [ ]   Breast:  Breast lumps [ ]   nipple discharge [ ]    Neuro: Headaches[ ]  Vertigo[ ]  Paresthesias[ ]  Spasm [ ]  Speech changes [ ]   Incoordination [ ]   Ortho: Arthritis [ ]  Joint pain [ ]  Muscle pain [ ]  Joint swelling [ ]  Back Pain [ ]  Skin:  Rash [ ]   Pruritis [ ]  Change in skin lesion [ ]   Psych: Depression[ ]  Anxiety[ ]  Confusion [ ]  Memory loss [ ]   Heme/Lypmh: Bleeding [ ]  Bruising [ ]  Enlarged lymph nodes [ ]   Endocrine: Visual blurring [ ]  Paresthesia [ ]  Polyuria [ ]  Polydypsea [ ]    Heat/cold intolerance [ ]  Hypoglycemia [ ]   Physical Exam: Estimated body mass index is 28.53 kg/(m^2) as calculated from the following:   Height as of 08/27/13: 5\' 3"  (1.6 m).   Weight as of this encounter: 161 lb (73.029 kg). BP 118/68  Pulse 72  Temp(Src) 98.1 F (36.7 C)  Resp 16  Wt 161 lb (73.029 kg) General Appearance: Well nourished, in no apparent distress. Eyes: PERRLA, EOMs, left eye ptosis, conjunctiva no swelling or erythema, normal fundi and vessels. Sinuses: No Frontal/maxillary tenderness ENT/Mouth: Ext aud canals clear, normal light reflex with TMs without erythema, bulging.  Good dentition. No erythema, swelling, or exudate on post pharynx. Tonsils not swollen or erythematous. Hearing normal.  Neck: Supple, thyroid normal. No bruits Respiratory: Respiratory effort normal, BS equal bilaterally without rales, rhonchi, wheezing or stridor. Cardio: RRR without murmurs, rubs or gallops. Brisk peripheral pulses without edema.  Chest: symmetric, with normal excursions and percussion. Breasts: defer Abdomen: Soft, +BS. Non tender, no guarding, rebound, hernias, masses, or organomegaly. .  Lymphatics: Non tender without lymphadenopathy.  Genitourinary: defer Musculoskeletal: Full ROM all peripheral extremities,5/5 strength, and normal gait. Skin: Warm, dry without rashes, lesions, ecchymosis.  Neuro: Cranial nerves intact, reflexes equal bilaterally. Normal muscle tone, no cerebellar symptoms. Sensation intact.  Psych: Awake and oriented X 3, normal affect, Insight and Judgment appropriate.   EKG: WNL no  changes.  Assessment and Plan: Arthritis- hands and feet- wear compression stockings, aleve PRN  Cancer- ocular melanoma- following with [facility]  Hypothyroidism- check level and send to Dr. Chalmers Cater  Health Maintenance   Discussed med's effects and SE's. Screening labs and tests as requested with regular follow-up as recommended.   Vicie Mutters 2:22 PM

## 2014-01-29 LAB — BASIC METABOLIC PANEL WITH GFR
BUN: 14 mg/dL (ref 6–23)
CALCIUM: 9.7 mg/dL (ref 8.4–10.5)
CO2: 24 meq/L (ref 19–32)
Chloride: 101 mEq/L (ref 96–112)
Creat: 0.63 mg/dL (ref 0.50–1.10)
GFR, Est African American: >89 mL/min
GFR, Est Non African American: >89 mL/min
Glucose, Bld: 69 mg/dL — ABNORMAL LOW (ref 70–99)
POTASSIUM: 3.7 meq/L (ref 3.5–5.3)
Sodium: 136 mEq/L (ref 135–145)

## 2014-01-29 LAB — IRON AND TIBC
%SAT: 19 % — ABNORMAL LOW (ref 20–55)
Iron: 78 ug/dL (ref 42–145)
TIBC: 418 ug/dL (ref 250–470)
UIBC: 340 ug/dL (ref 125–400)

## 2014-01-29 LAB — MAGNESIUM: MAGNESIUM: 1.9 mg/dL (ref 1.5–2.5)

## 2014-01-29 LAB — LIPID PANEL
Cholesterol: 191 mg/dL (ref 0–200)
HDL: 59 mg/dL (ref >39)
LDL CALC: 99 mg/dL (ref 0–99)
Total CHOL/HDL Ratio: 3.2 Ratio
Triglycerides: 163 mg/dL — ABNORMAL HIGH (ref <150)
VLDL: 33 mg/dL (ref 0–40)

## 2014-01-29 LAB — HEPATIC FUNCTION PANEL
ALK PHOS: 91 U/L (ref 39–117)
ALT: 13 U/L (ref 0–35)
AST: 16 U/L (ref 0–37)
Albumin: 4.5 g/dL (ref 3.5–5.2)
BILIRUBIN DIRECT: 0.1 mg/dL (ref 0.0–0.3)
BILIRUBIN INDIRECT: 0.3 mg/dL (ref 0.2–1.2)
Total Bilirubin: 0.4 mg/dL (ref 0.2–1.2)
Total Protein: 7 g/dL (ref 6.0–8.3)

## 2014-01-29 LAB — URINALYSIS, ROUTINE W REFLEX MICROSCOPIC
BILIRUBIN URINE: NEGATIVE
GLUCOSE, UA: NEGATIVE mg/dL
HGB URINE DIPSTICK: NEGATIVE
Ketones, ur: NEGATIVE mg/dL
Leukocytes, UA: NEGATIVE
Nitrite: NEGATIVE
Protein, ur: NEGATIVE mg/dL
Specific Gravity, Urine: 1.017 (ref 1.005–1.030)
Urobilinogen, UA: 1 mg/dL (ref 0.0–1.0)
pH: 7.5 (ref 5.0–8.0)

## 2014-01-29 LAB — GAMMA GT: GGT: 15 U/L (ref 7–51)

## 2014-01-29 LAB — MICROALBUMIN / CREATININE URINE RATIO
Creatinine, Urine: 109.5 mg/dL
MICROALB/CREAT RATIO: 4.6 mg/g (ref 0.0–30.0)
Microalb, Ur: 0.5 mg/dL (ref 0.00–1.89)

## 2014-01-29 LAB — INSULIN, FASTING: Insulin fasting, serum: 19 u[IU]/mL (ref 3–28)

## 2014-01-29 LAB — VITAMIN B12: VITAMIN B 12: 294 pg/mL (ref 211–911)

## 2014-01-29 LAB — VITAMIN D 25 HYDROXY (VIT D DEFICIENCY, FRACTURES): Vit D, 25-Hydroxy: 25 ng/mL — ABNORMAL LOW (ref 30–89)

## 2014-01-29 LAB — TSH: TSH: 2.263 u[IU]/mL (ref 0.350–4.500)

## 2014-05-05 ENCOUNTER — Other Ambulatory Visit: Payer: Self-pay | Admitting: Physician Assistant

## 2014-05-05 DIAGNOSIS — C6930 Malignant neoplasm of unspecified choroid: Secondary | ICD-10-CM

## 2014-05-05 DIAGNOSIS — C6932 Malignant neoplasm of left choroid: Secondary | ICD-10-CM

## 2014-05-07 ENCOUNTER — Ambulatory Visit (HOSPITAL_COMMUNITY)
Admission: RE | Admit: 2014-05-07 | Discharge: 2014-05-07 | Disposition: A | Discharge status: Home / Self Care | Payer: 59 | Source: Ambulatory Visit | Attending: Physician Assistant | Admitting: Physician Assistant

## 2014-05-07 DIAGNOSIS — C6930 Malignant neoplasm of unspecified choroid: Secondary | ICD-10-CM | POA: Diagnosis not present

## 2014-05-08 ENCOUNTER — Other Ambulatory Visit: Payer: 59

## 2014-05-08 DIAGNOSIS — C6932 Malignant neoplasm of left choroid: Secondary | ICD-10-CM

## 2014-05-09 LAB — HEPATIC FUNCTION PANEL
ALBUMIN: 4.1 g/dL (ref 3.5–5.2)
ALT: 14 U/L (ref 0–35)
AST: 16 U/L (ref 0–37)
Alkaline Phosphatase: 83 U/L (ref 39–117)
BILIRUBIN INDIRECT: 0.3 mg/dL (ref 0.2–1.2)
Bilirubin, Direct: 0.1 mg/dL (ref 0.0–0.3)
Total Bilirubin: 0.4 mg/dL (ref 0.2–1.2)
Total Protein: 6.2 g/dL (ref 6.0–8.3)

## 2014-05-09 LAB — GAMMA GT: GGT: 14 U/L (ref 7–51)

## 2014-05-14 ENCOUNTER — Other Ambulatory Visit: Payer: 59

## 2014-05-16 ENCOUNTER — Other Ambulatory Visit: Payer: 59

## 2014-05-21 ENCOUNTER — Other Ambulatory Visit: Payer: Self-pay | Admitting: Infectious Disease

## 2014-05-21 ENCOUNTER — Ambulatory Visit
Admission: RE | Admit: 2014-05-21 | Discharge: 2014-05-21 | Disposition: A | Discharge status: Home / Self Care | Payer: 59 | Source: Ambulatory Visit | Attending: Physician Assistant | Admitting: Physician Assistant

## 2014-05-21 DIAGNOSIS — C6932 Malignant neoplasm of left choroid: Secondary | ICD-10-CM

## 2014-05-21 MED ORDER — IOHEXOL 300 MG/ML  SOLN: 100.0000 mL | Freq: Once | INTRAMUSCULAR | Status: AC | PRN

## 2014-05-23 ENCOUNTER — Encounter: Payer: Self-pay | Admitting: Physician Assistant

## 2014-07-31 ENCOUNTER — Encounter: Payer: Self-pay | Admitting: Physician Assistant

## 2014-07-31 ENCOUNTER — Ambulatory Visit (INDEPENDENT_AMBULATORY_CARE_PROVIDER_SITE_OTHER): Payer: 59 | Admitting: Physician Assistant

## 2014-07-31 VITALS — BP 120/78 | HR 64 | Temp 98.4°F | Resp 16 | Ht 63.0 in | Wt 172.0 lb

## 2014-07-31 DIAGNOSIS — E559 Vitamin D deficiency, unspecified: Secondary | ICD-10-CM

## 2014-07-31 DIAGNOSIS — Z79899 Other long term (current) drug therapy: Secondary | ICD-10-CM

## 2014-07-31 DIAGNOSIS — E785 Hyperlipidemia, unspecified: Secondary | ICD-10-CM

## 2014-07-31 DIAGNOSIS — E039 Hypothyroidism, unspecified: Secondary | ICD-10-CM

## 2014-07-31 LAB — HEPATIC FUNCTION PANEL
ALT: 23 U/L (ref 0–35)
AST: 23 U/L (ref 0–37)
Albumin: 4.1 g/dL (ref 3.5–5.2)
Alkaline Phosphatase: 97 U/L (ref 39–117)
Bilirubin, Direct: 0.1 mg/dL (ref 0.0–0.3)
Indirect Bilirubin: 0.3 mg/dL (ref 0.2–1.2)
TOTAL PROTEIN: 6.6 g/dL (ref 6.0–8.3)
Total Bilirubin: 0.4 mg/dL (ref 0.2–1.2)

## 2014-07-31 LAB — TSH: TSH: 2.445 u[IU]/mL (ref 0.350–4.500)

## 2014-07-31 LAB — LIPID PANEL
CHOLESTEROL: 194 mg/dL (ref 0–200)
HDL: 56 mg/dL (ref >39)
LDL CALC: 118 mg/dL — AB (ref 0–99)
Total CHOL/HDL Ratio: 3.5 Ratio
Triglycerides: 101 mg/dL (ref <150)
VLDL: 20 mg/dL (ref 0–40)

## 2014-07-31 LAB — BASIC METABOLIC PANEL WITH GFR
BUN: 13 mg/dL (ref 6–23)
CALCIUM: 9.6 mg/dL (ref 8.4–10.5)
CO2: 27 mEq/L (ref 19–32)
CREATININE: 0.63 mg/dL (ref 0.50–1.10)
Chloride: 102 mEq/L (ref 96–112)
GLUCOSE: 80 mg/dL (ref 70–99)
POTASSIUM: 4 meq/L (ref 3.5–5.3)
Sodium: 137 mEq/L (ref 135–145)

## 2014-07-31 LAB — MAGNESIUM: MAGNESIUM: 1.7 mg/dL (ref 1.5–2.5)

## 2014-07-31 NOTE — Patient Instructions (Signed)

## 2014-07-31 NOTE — Progress Notes (Signed)
Assessment and Plan:  Hypertension: Continue medication, monitor blood pressure at home. Continue DASH diet.  Reminder to go to the ER if any CP, SOB, nausea, dizziness, severe HA, changes vision/speech, left arm numbness and tingling, and jaw pain. Cholesterol: Continue diet and exercise. Check cholesterol.  Vitamin D Def- check level and continue medications.  Hypothyroidism-check TSH level, continue medications the same, reminded to take on an empty stomach 30-61mins before food.    Continue diet and meds as discussed. Further disposition pending results of labs.  HPI 43 y.o. female  presents for 3 month follow up with hypertension, hyperlipidemia, prediabetes and vitamin D. Her blood pressure has been controlled at home, today their BP is BP: 120/78 mmHg She does not workout. She denies chest pain, shortness of breath, dizziness.  She is not on cholesterol medication and denies myalgias. Her cholesterol is at goal. The cholesterol last visit was:   Lab Results  Component Value Date   CHOL 191 01/28/2014   HDL 59 01/28/2014   LDLCALC 99 01/28/2014   TRIG 163* 01/28/2014   CHOLHDL 3.2 01/28/2014   She has been working on diet and exercise for prediabetes, and denies paresthesia of the feet, polydipsia, polyuria and visual disturbances. Last A1C in the office was:  Lab Results  Component Value Date   HGBA1C 5.2 01/28/2014   Patient is on Vitamin D supplement.   Lab Results  Component Value Date   VD25OH 25* 01/28/2014     She is on thyroid medication. Her medication was not changed last visit. Patient denies denies fatigue, weight changes, heat/cold intolerance, bowel/skin changes or CVS symptoms  Lab Results  Component Value Date   TSH 2.263 01/28/2014   She is following with [facility] for Malignant neoplasm of choroid and has had radiation with frequency negative scans, she is getting shots in her eye for this.   Current Medications:  Current Outpatient Prescriptions on File  Prior to Visit  Medication Sig Dispense Refill  . B Complex Vitamins (VITAMIN B COMPLEX PO) Take 1 tablet by mouth daily.    . brimonidine-timolol (COMBIGAN) 0.2-0.5 % ophthalmic solution Place 1 drop into the left eye 2 (two) times daily.    Marland Kitchen CALCIUM PO Take 1 tablet by mouth daily.    . cholecalciferol (VITAMIN D) 1000 UNITS tablet Take 2,000 Units by mouth daily.    . Cyanocobalamin (VITAMIN B-12 PO) Take 1 tablet by mouth daily.    . fexofenadine (ALLEGRA) 180 MG tablet Take 180 mg by mouth daily.    . fluocinonide cream (LIDEX) 1.61 % Apply 1 application topically daily as needed (for eczema flare ups).    . IRON PO Take 1 tablet by mouth daily.    Marland Kitchen levothyroxine (SYNTHROID, LEVOTHROID) 112 MCG tablet Take 112 mcg by mouth daily before breakfast.    . MELATONIN PO Take 1 tablet by mouth at bedtime.    . Multiple Vitamin (MULTIVITAMIN WITH MINERALS) TABS tablet Take 1 tablet by mouth daily.     No current facility-administered medications on file prior to visit.   Medical History:  Past Medical History  Diagnosis Date  . PONV (postoperative nausea and vomiting)     past surgery Scop patch and propofol helped  . Arthritis     hands and feet  . Cancer     ocular melanoma  . Thyroid disease   . Hypothyroidism    Allergies:  Allergies  Allergen Reactions  . Oxycodone Nausea And Vomiting    Can  take morphine and possibly Vicodin     Review of Systems: [X]  = complains of  [ ]  = denies  General: Fatigue [ ]  Fever [ ]  Chills [ ]  Weakness [ ]   Insomnia [ ]  Eyes: Redness [ ]  Blurred vision [ ]  Diplopia [ ]   ENT: Congestion [ ]  Sinus Pain [ ]  Post Nasal Drip [ ]  Sore Throat [ ]  Earache [ ]   Cardiac: Chest pain/pressure [ ]  SOB [ ]  Orthopnea [ ]   Palpitations [ ]   Paroxysmal nocturnal dyspnea[ ]  Claudication [ ]  Edema [ ]   Pulmonary: Cough [ ]  Wheezing[ ]   SOB [ ]   Snoring [ ]   GI: Nausea [ ]  Vomiting[ ]  Dysphagia[ ]  Heartburn[ ]  Abdominal pain [ ]  Constipation [ ] ; Diarrhea [ ] ;  BRBPR [ ]  Melena[ ]  GU: Hematuria[ ]  Dysuria [ ]  Nocturia[ ]  Urgency [ ]   Hesitancy [ ]  Discharge [ ]  Neuro: Headaches[ ]  Vertigo[ ]  Paresthesias[ ]  Spasm [ ]  Speech changes [ ]  Incoordination [ ]   Ortho: Arthritis [ ]  Joint pain [ ]  Muscle pain [ ]  Joint swelling [ ]  Back Pain [ ]  Skin:  Rash [ ]   Pruritis [ ]  Change in skin lesion [ ]   Psych: Depression[ ]  Anxiety[ ]  Confusion [ ]  Memory loss [ ]   Heme/Lypmh: Bleeding [ ]  Bruising [ ]  Enlarged lymph nodes [ ]   Endocrine: Visual blurring [ ]  Paresthesia [ ]  Polyuria [ ]  Polydypsea [ ]    Heat/cold intolerance [ ]  Hypoglycemia [ ]   Family history- Review and unchanged Social history- Review and unchanged Physical Exam: BP 120/78 mmHg  Pulse 64  Temp(Src) 98.4 F (36.9 C)  Resp 16  Ht 5\' 3"  (1.6 m)  Wt 172 lb (78.019 kg)  BMI 30.48 kg/m2 Wt Readings from Last 3 Encounters:  07/31/14 172 lb (78.019 kg)  01/28/14 161 lb (73.029 kg)  08/27/13 153 lb (69.4 kg)   General Appearance: Well nourished, in no apparent distress. Eyes: PERRLA, EOMs, conjunctiva no swelling or erythema Sinuses: No Frontal/maxillary tenderness ENT/Mouth: Ext aud canals clear, TMs without erythema, bulging. No erythema, swelling, or exudate on post pharynx.  Tonsils not swollen or erythematous. Hearing normal.  Neck: Supple, thyroid normal.  Respiratory: Respiratory effort normal, BS equal bilaterally without rales, rhonchi, wheezing or stridor.  Cardio: RRR with no MRGs. Brisk peripheral pulses without edema.  Abdomen: Soft, + BS.  Non tender, no guarding, rebound, hernias, masses. Lymphatics: Non tender without lymphadenopathy.  Musculoskeletal: Full ROM, 5/5 strength, normal gait.  Skin: Warm, dry without rashes, lesions, ecchymosis.  Neuro: Cranial nerves intact. Normal muscle tone, no cerebellar symptoms. Sensation intact.  Psych: Awake and oriented X 3, normal affect, Insight and Judgment appropriate.    Vicie Mutters, PA-C 3:46 PM [facility]

## 2014-08-01 ENCOUNTER — Encounter: Payer: Self-pay | Admitting: Physician Assistant

## 2014-08-01 LAB — CBC WITH DIFFERENTIAL/PLATELET
BASOS ABS: 0.1 10*3/uL (ref 0.0–0.1)
BASOS PCT: 1 % (ref 0–1)
EOS ABS: 0.1 10*3/uL (ref 0.0–0.7)
EOS PCT: 1 % (ref 0–5)
HEMATOCRIT: 37.2 % (ref 36.0–46.0)
Hemoglobin: 12.2 g/dL (ref 12.0–15.0)
Lymphocytes Relative: 35 % (ref 12–46)
Lymphs Abs: 2.2 10*3/uL (ref 0.7–4.0)
MCH: 28.3 pg (ref 26.0–34.0)
MCHC: 32.8 g/dL (ref 30.0–36.0)
MCV: 86.3 fL (ref 78.0–100.0)
Monocytes Absolute: 0.3 10*3/uL (ref 0.1–1.0)
Monocytes Relative: 5 % (ref 3–12)
NEUTROS ABS: 3.7 10*3/uL (ref 1.7–7.7)
Neutrophils Relative %: 58 % (ref 43–77)
Platelets: 270 10*3/uL (ref 150–400)
RBC: 4.31 MIL/uL (ref 3.87–5.11)
RDW: 13.7 % (ref 11.5–15.5)
WBC: 6.3 10*3/uL (ref 4.0–10.5)

## 2014-08-01 LAB — VITAMIN D 25 HYDROXY (VIT D DEFICIENCY, FRACTURES): Vit D, 25-Hydroxy: 24 ng/mL — ABNORMAL LOW (ref 30–89)

## 2014-08-01 MED ORDER — LEVOTHYROXINE SODIUM 112 MCG PO TABS: 112.0000 ug | ORAL_TABLET | Freq: Every day | ORAL | Status: DC

## 2014-12-03 ENCOUNTER — Encounter: Payer: Self-pay | Admitting: *Deleted

## 2015-02-04 ENCOUNTER — Encounter: Payer: Self-pay | Admitting: Physician Assistant

## 2015-02-17 DIAGNOSIS — M129 Arthropathy, unspecified: Secondary | ICD-10-CM | POA: Insufficient documentation

## 2015-02-24 ENCOUNTER — Other Ambulatory Visit: Payer: Self-pay | Admitting: Internal Medicine

## 2015-03-18 ENCOUNTER — Encounter: Payer: Self-pay | Admitting: Physician Assistant

## 2015-03-30 ENCOUNTER — Encounter: Payer: Self-pay | Admitting: *Deleted

## 2015-04-01 ENCOUNTER — Ambulatory Visit (INDEPENDENT_AMBULATORY_CARE_PROVIDER_SITE_OTHER): Payer: 59 | Admitting: Physician Assistant

## 2015-04-01 ENCOUNTER — Encounter: Payer: Self-pay | Admitting: Physician Assistant

## 2015-04-01 VITALS — BP 128/88 | HR 68 | Temp 98.4°F | Resp 16 | Ht 63.0 in | Wt 178.0 lb

## 2015-04-01 DIAGNOSIS — M129 Arthropathy, unspecified: Secondary | ICD-10-CM

## 2015-04-01 DIAGNOSIS — Z79899 Other long term (current) drug therapy: Secondary | ICD-10-CM | POA: Insufficient documentation

## 2015-04-01 DIAGNOSIS — E538 Deficiency of other specified B group vitamins: Secondary | ICD-10-CM

## 2015-04-01 DIAGNOSIS — E039 Hypothyroidism, unspecified: Secondary | ICD-10-CM

## 2015-04-01 DIAGNOSIS — D649 Anemia, unspecified: Secondary | ICD-10-CM

## 2015-04-01 DIAGNOSIS — H4062X0 Glaucoma secondary to drugs, left eye, stage unspecified: Secondary | ICD-10-CM

## 2015-04-01 DIAGNOSIS — E785 Hyperlipidemia, unspecified: Secondary | ICD-10-CM

## 2015-04-01 DIAGNOSIS — C6932 Malignant neoplasm of left choroid: Secondary | ICD-10-CM

## 2015-04-01 DIAGNOSIS — L409 Psoriasis, unspecified: Secondary | ICD-10-CM

## 2015-04-01 DIAGNOSIS — Z0000 Encounter for general adult medical examination without abnormal findings: Secondary | ICD-10-CM

## 2015-04-01 DIAGNOSIS — E559 Vitamin D deficiency, unspecified: Secondary | ICD-10-CM

## 2015-04-01 DIAGNOSIS — I10 Essential (primary) hypertension: Secondary | ICD-10-CM

## 2015-04-01 LAB — CBC WITH DIFFERENTIAL/PLATELET
BASOS ABS: 0.1 10*3/uL (ref 0.0–0.1)
BASOS PCT: 1 % (ref 0–1)
Eosinophils Absolute: 0.1 10*3/uL (ref 0.0–0.7)
Eosinophils Relative: 1 % (ref 0–5)
HCT: 39.5 % (ref 36.0–46.0)
HEMOGLOBIN: 13.3 g/dL (ref 12.0–15.0)
Lymphocytes Relative: 31 % (ref 12–46)
Lymphs Abs: 2.2 10*3/uL (ref 0.7–4.0)
MCH: 28.7 pg (ref 26.0–34.0)
MCHC: 33.7 g/dL (ref 30.0–36.0)
MCV: 85.3 fL (ref 78.0–100.0)
MPV: 9.4 fL (ref 8.6–12.4)
Monocytes Absolute: 0.3 10*3/uL (ref 0.1–1.0)
Monocytes Relative: 4 % (ref 3–12)
Neutro Abs: 4.4 10*3/uL (ref 1.7–7.7)
Neutrophils Relative %: 63 % (ref 43–77)
Platelets: 291 10*3/uL (ref 150–400)
RBC: 4.63 MIL/uL (ref 3.87–5.11)
RDW: 14 % (ref 11.5–15.5)
WBC: 7 10*3/uL (ref 4.0–10.5)

## 2015-04-01 LAB — TSH: TSH: 1.64 u[IU]/mL (ref 0.350–4.500)

## 2015-04-01 LAB — FERRITIN: FERRITIN: 13 ng/mL (ref 10–291)

## 2015-04-01 LAB — VITAMIN B12: Vitamin B-12: 274 pg/mL (ref 211–911)

## 2015-04-01 NOTE — Patient Instructions (Signed)

## 2015-04-01 NOTE — Addendum Note (Signed)
Addended by: Vicie Mutters R on: 04/01/2015 10:04 AM   Modules accepted: SmartSet

## 2015-04-01 NOTE — Progress Notes (Signed)
Complete Physical  Assessment and Plan: 1. Hypothyroidism, unspecified hypothyroidism type Hypothyroidism-check TSH level, continue medications the same, reminded to take on an empty stomach 30-51mins before food.  - TSH  2. Malignant neoplasm of choroid, left Will send labs to Dr. Rito Ehrlich  - Hepatic function panel - DG Chest 2 View; Future - Gamma GT - CT Abdomen Pelvis W Contrast; Future  3. Vitamin D deficiency - Vit D  25 hydroxy (rtn osteoporosis monitoring)  4. Hyperlipidemia - Lipid panel - Urinalysis, Routine w reflex microscopic (not at [facility]) - Microalbumin / creatinine urine ratio  5. Medication management - CBC with Differential/Platelet - BASIC METABOLIC PANEL WITH GFR - Magnesium  6. Anemia, unspecified anemia type Anemia - monitor, continue iron supp with Vitamin C and increase green leafy veggies - Iron and TIBC - Ferritin  7. B12 deficiency - Vitamin B12  8. Corticosteroid-induced glaucoma, left, stage unspecified Continue to follow up eye doctor  9. Psoriasis No rash at this time, monitor  10. Arthropathia Monitor, controlled for now, if worse may need to send RA  11. Routine general medical examination at a health care facility   Health Maintenance   Discussed med's effects and SE's. Screening labs and tests as requested with regular follow-up as recommended.  HPI 44 y.o. female  presents for a complete physical. Her blood pressure has been controlled at home, today their BP is BP: 128/88 mmHg She does workout, walking. She denies chest pain, shortness of breath, dizziness.  She is not on cholesterol medication and denies myalgias. Her cholesterol is at goal. The cholesterol last visit was:  Lab Results  Component Value Date   CHOL 194 07/31/2014   HDL 56 07/31/2014   LDLCALC 118* 07/31/2014   TRIG 101 07/31/2014   CHOLHDL 3.5 07/31/2014    Last A1C in the office was:  Lab Results  Component Value Date   HGBA1C 5.2 01/28/2014    Patient is on Vitamin D supplement, but very bad about taking it.  Lab Results  Component Value Date   VD25OH 24* 07/31/2014   She has a B12 but does not take it regularly, takes sublingual once every 2 weeks.  Lab Results  Component Value Date   [id number] 570 01/28/2014   She is on thyroid medication. Her medication was not changed last visit.   Lab Results  Component Value Date   TSH 2.445 07/31/2014  .  She was diagnosed with ocular melanoma, left eye in 2014 and has been seeing a specialist at [facility], Dr. Rito Ehrlich but he is leaving, she had radiation and recently had increased pressure in her left eye and seeing Dr. Celene Squibb, she is on [place] and she is getting injections  She gets METS surveillance yearly and is due for CT AB with contrast, LFTs and CXR.   Had Pap with Dr. Marvel Plan.  She is also seeing Dr. [place] every 3 months for skin checks.  She had laparoscopic surgery in Dec 2014, with Dr. Marvel Plan for endometrium on her ovary.  Wt Readings from Last 3 Encounters:  04/01/15 178 lb (80.74 kg)  07/31/14 172 lb (78.019 kg)  01/28/14 161 lb (73.029 kg)    Current Medications:  Current Outpatient Prescriptions on File Prior to Visit  Medication Sig Dispense Refill  . B Complex Vitamins (VITAMIN B COMPLEX PO) Take 1 tablet by mouth daily.    . brimonidine-timolol (COMBIGAN) 0.2-0.5 % ophthalmic solution Place 1 drop into the left eye 2 (two) times daily.    Marland Kitchen  CALCIUM PO Take 1 tablet by mouth daily.    . cholecalciferol (VITAMIN D) 1000 UNITS tablet Take 2,000 Units by mouth daily.    . Cyanocobalamin (VITAMIN B-12 PO) Take 1 tablet by mouth daily.    . fexofenadine (ALLEGRA) 180 MG tablet Take 180 mg by mouth daily.    . fluocinonide cream (LIDEX) 6.50 % Apply 1 application topically daily as needed (for eczema flare ups).    . IRON PO Take 1 tablet by mouth daily.    Marland Kitchen levothyroxine (SYNTHROID, LEVOTHROID) 112 MCG tablet Take 1 tablet (112 mcg total) by mouth  daily before breakfast. 90 tablet 1  . MELATONIN PO Take 1 tablet by mouth at bedtime.    . Multiple Vitamin (MULTIVITAMIN WITH MINERALS) TABS tablet Take 1 tablet by mouth daily.     No current facility-administered medications on file prior to visit.   Health Maintenance:   Immunization History  Administered Date(s) Administered  . Tdap 08/12/2014   TDAP: 2015 Pneumovax: N/A Prevnar 13: N/A Flu vaccine: 2014 Zostavax: N/A Pap: 2016 q 5 years MGM: 12/2013 DEXA: N/A Colonoscopy: EGD:  Allergies:  Allergies  Allergen Reactions  . Oxycodone Nausea And Vomiting    Can take morphine and possibly Vicodin   Medical History:  Past Medical History  Diagnosis Date  . PONV (postoperative nausea and vomiting)     past surgery Scop patch and propofol helped  . Arthritis     hands and feet  . Cancer     ocular melanoma  . Thyroid disease   . Hypothyroidism    Surgical History:  Past Surgical History  Procedure Laterality Date  . Wisdom tooth extraction    . Eye surgery      radiation plaque placement and removal  . Laparoscopy N/A 09/13/2013    Procedure: LAPAROSCOPY OPERATIVE, removal of pelvic mass;  Surgeon: Logan Bores, MD;  Location: [facility];  Service: Gynecology;  Laterality: N/A;   Family History:  Family History  Problem Relation Age of Onset  . Varicose Veins Mother   . Arthritis Mother   . Cancer Mother     Melanoma  . Nephrolithiasis Father   . Hyperlipidemia Father   . Cancer Maternal Grandmother     Lung  . Heart disease Maternal Grandfather     CHF   Social History:  History  Substance Use Topics  . Smoking status: Never Smoker   . Smokeless tobacco: Never Used  . Alcohol Use: Yes     Comment: rare   Review of Systems  Constitutional: Negative.   HENT: Positive for congestion (allergies, on allegra). Negative for ear discharge, ear pain, hearing loss, nosebleeds, sore throat and tinnitus.   Eyes: Positive for blurred vision. Negative  for double vision, photophobia, pain, discharge and redness.  Respiratory: Negative.  Negative for stridor.   Cardiovascular: Negative.   Gastrointestinal: Negative.   Genitourinary: Negative.   Musculoskeletal: Positive for joint pain (hands and feet occ). Negative for myalgias, back pain, falls and neck pain.  Skin: Negative.  Negative for itching and rash.  Neurological: Negative.  Negative for headaches.  Endo/Heme/Allergies: Negative.   Psychiatric/Behavioral: Negative.     Physical Exam: Estimated body mass index is 31.54 kg/(m^2) as calculated from the following:   Height as of this encounter: 5\' 3"  (1.6 m).   Weight as of this encounter: 178 lb (80.74 kg). BP 128/88 mmHg  Pulse 68  Temp(Src) 98.4 F (36.9 C)  Resp 16  Ht 5'  3" (1.6 m)  Wt 178 lb (80.74 kg)  BMI 31.54 kg/m2  LMP 03/20/2015 (Approximate) General Appearance: Well nourished, in no apparent distress. Eyes: PERRLA, EOMs, left eye ptosis improved, conjunctiva no swelling or erythema Sinuses: No Frontal/maxillary tenderness ENT/Mouth: Ext aud canals clear, normal light reflex with TMs without erythema, bulging.  Good dentition. No erythema, swelling, or exudate on post pharynx. Tonsils not swollen or erythematous. Hearing normal.  Neck: Supple, thyroid normal. No bruits Respiratory: Respiratory effort normal, BS equal bilaterally without rales, rhonchi, wheezing or stridor. Cardio: RRR without murmurs, rubs or gallops. Brisk peripheral pulses without edema.  Chest: symmetric, with normal excursions and percussion. Breasts: defer Abdomen: Soft, +BS. Non tender, no guarding, rebound, hernias, masses, or organomegaly. .  Lymphatics: Non tender without lymphadenopathy.  Genitourinary: defer Musculoskeletal: Full ROM all peripheral extremities,5/5 strength, no deformities, joint swelling, redness hands and feet, and normal gait. Skin: Warm, dry without rashes, lesions, ecchymosis.  Neuro: Cranial nerves intact,  reflexes equal bilaterally. Normal muscle tone, no cerebellar symptoms. Sensation intact.  Psych: Awake and oriented X 3, normal affect, Insight and Judgment appropriate.   EKG: WNL no changes.   Vicie Mutters 9:27 AM

## 2015-04-02 LAB — HEPATIC FUNCTION PANEL
ALT: 13 U/L (ref 0–35)
AST: 14 U/L (ref 0–37)
Albumin: 4.4 g/dL (ref 3.5–5.2)
Alkaline Phosphatase: 105 U/L (ref 39–117)
Bilirubin, Direct: 0.1 mg/dL (ref 0.0–0.3)
Indirect Bilirubin: 0.3 mg/dL (ref 0.2–1.2)
Total Bilirubin: 0.4 mg/dL (ref 0.2–1.2)
Total Protein: 7.2 g/dL (ref 6.0–8.3)

## 2015-04-02 LAB — BASIC METABOLIC PANEL WITHOUT GFR
BUN: 12 mg/dL (ref 6–23)
CO2: 23 meq/L (ref 19–32)
Calcium: 10 mg/dL (ref 8.4–10.5)
Chloride: 102 meq/L (ref 96–112)
Creat: 0.61 mg/dL (ref 0.50–1.10)
GFR, Est African American: >89 mL/min
GFR, Est Non African American: >89 mL/min
Glucose, Bld: 88 mg/dL (ref 70–99)
Potassium: 4.1 meq/L (ref 3.5–5.3)
Sodium: 140 meq/L (ref 135–145)

## 2015-04-02 LAB — MICROALBUMIN / CREATININE URINE RATIO
Creatinine, Urine: 41.2 mg/dL
Microalb Creat Ratio: 4.9 mg/g (ref 0.0–30.0)
Microalb, Ur: 0.2 mg/dL

## 2015-04-02 LAB — URINALYSIS, ROUTINE W REFLEX MICROSCOPIC
Bilirubin Urine: NEGATIVE
GLUCOSE, UA: NEGATIVE mg/dL
HGB URINE DIPSTICK: NEGATIVE
Ketones, ur: NEGATIVE mg/dL
Leukocytes, UA: NEGATIVE
Nitrite: NEGATIVE
PH: 6.5 (ref 5.0–8.0)
Protein, ur: NEGATIVE mg/dL
Urobilinogen, UA: 0.2 mg/dL (ref 0.0–1.0)

## 2015-04-02 LAB — IRON AND TIBC
%SAT: 23 % (ref 20–55)
Iron: 97 ug/dL (ref 42–145)
TIBC: 431 ug/dL (ref 250–470)
UIBC: 334 ug/dL (ref 125–400)

## 2015-04-02 LAB — GAMMA GT: GGT: 14 U/L (ref 7–51)

## 2015-04-02 LAB — LIPID PANEL
Cholesterol: 192 mg/dL (ref 0–200)
HDL: 48 mg/dL
LDL Cholesterol: 111 mg/dL — ABNORMAL HIGH (ref 0–99)
Total CHOL/HDL Ratio: 4 ratio
Triglycerides: 164 mg/dL — ABNORMAL HIGH
VLDL: 33 mg/dL (ref 0–40)

## 2015-04-02 LAB — MAGNESIUM: MAGNESIUM: 1.8 mg/dL (ref 1.5–2.5)

## 2015-04-02 LAB — VITAMIN D 25 HYDROXY (VIT D DEFICIENCY, FRACTURES): Vit D, 25-Hydroxy: 21 ng/mL — ABNORMAL LOW (ref 30–100)

## 2015-04-30 ENCOUNTER — Other Ambulatory Visit: Payer: Self-pay

## 2015-05-11 ENCOUNTER — Ambulatory Visit (HOSPITAL_COMMUNITY)
Admission: RE | Admit: 2015-05-11 | Discharge: 2015-05-11 | Disposition: A | Discharge status: Home / Self Care | Payer: 59 | Source: Ambulatory Visit | Attending: Physician Assistant | Admitting: Physician Assistant

## 2015-05-11 ENCOUNTER — Ambulatory Visit
Admission: RE | Admit: 2015-05-11 | Discharge: 2015-05-11 | Disposition: A | Discharge status: Home / Self Care | Payer: 59 | Source: Ambulatory Visit | Attending: Physician Assistant | Admitting: Physician Assistant

## 2015-05-11 DIAGNOSIS — C6932 Malignant neoplasm of left choroid: Secondary | ICD-10-CM

## 2015-05-11 MED ORDER — IOPAMIDOL (ISOVUE-300) INJECTION 61%
100.0000 mL | Freq: Once | INTRAVENOUS | Status: AC | PRN
  Administered 2015-05-11: 100 mL via INTRAVENOUS

## 2015-05-13 ENCOUNTER — Other Ambulatory Visit: Payer: Self-pay | Admitting: Internal Medicine

## 2015-08-12 DIAGNOSIS — H35352 Cystoid macular degeneration, left eye: Secondary | ICD-10-CM | POA: Insufficient documentation

## 2015-10-05 ENCOUNTER — Ambulatory Visit: Payer: Self-pay | Admitting: Physician Assistant

## 2015-10-07 ENCOUNTER — Other Ambulatory Visit: Payer: Self-pay

## 2015-10-07 ENCOUNTER — Ambulatory Visit (INDEPENDENT_AMBULATORY_CARE_PROVIDER_SITE_OTHER): Payer: 59 | Admitting: Physician Assistant

## 2015-10-07 ENCOUNTER — Encounter: Payer: Self-pay | Admitting: Physician Assistant

## 2015-10-07 VITALS — BP 126/74 | HR 77 | Temp 97.3°F | Resp 16 | Ht 63.0 in | Wt 176.0 lb

## 2015-10-07 DIAGNOSIS — E039 Hypothyroidism, unspecified: Secondary | ICD-10-CM | POA: Diagnosis not present

## 2015-10-07 DIAGNOSIS — E785 Hyperlipidemia, unspecified: Secondary | ICD-10-CM | POA: Diagnosis not present

## 2015-10-07 DIAGNOSIS — C6930 Malignant neoplasm of unspecified choroid: Secondary | ICD-10-CM | POA: Insufficient documentation

## 2015-10-07 DIAGNOSIS — D649 Anemia, unspecified: Secondary | ICD-10-CM

## 2015-10-07 DIAGNOSIS — Z79899 Other long term (current) drug therapy: Secondary | ICD-10-CM

## 2015-10-07 DIAGNOSIS — Z131 Encounter for screening for diabetes mellitus: Secondary | ICD-10-CM

## 2015-10-07 DIAGNOSIS — E538 Deficiency of other specified B group vitamins: Secondary | ICD-10-CM

## 2015-10-07 DIAGNOSIS — C6932 Malignant neoplasm of left choroid: Secondary | ICD-10-CM

## 2015-10-07 DIAGNOSIS — E559 Vitamin D deficiency, unspecified: Secondary | ICD-10-CM

## 2015-10-07 DIAGNOSIS — H5210 Myopia, unspecified eye: Secondary | ICD-10-CM | POA: Insufficient documentation

## 2015-10-07 DIAGNOSIS — H35419 Lattice degeneration of retina, unspecified eye: Secondary | ICD-10-CM | POA: Insufficient documentation

## 2015-10-07 LAB — BASIC METABOLIC PANEL WITH GFR
BUN: 13 mg/dL (ref 7–25)
CHLORIDE: 104 mmol/L (ref 98–110)
CO2: 26 mmol/L (ref 20–31)
Calcium: 9.3 mg/dL (ref 8.6–10.2)
Creat: 0.58 mg/dL (ref 0.50–1.10)
GFR, Est Non African American: >89 mL/min (ref >=60)
GLUCOSE: 86 mg/dL (ref 65–99)
POTASSIUM: 4.1 mmol/L (ref 3.5–5.3)
Sodium: 138 mmol/L (ref 135–146)

## 2015-10-07 LAB — LIPID PANEL
CHOL/HDL RATIO: 4.2 ratio (ref <=5.0)
CHOLESTEROL: 191 mg/dL (ref 125–200)
HDL: 46 mg/dL (ref >=46)
LDL Cholesterol: 120 mg/dL (ref <130)
TRIGLYCERIDES: 123 mg/dL (ref <150)
VLDL: 25 mg/dL (ref <30)

## 2015-10-07 LAB — HEPATIC FUNCTION PANEL
ALBUMIN: 4.2 g/dL (ref 3.6–5.1)
ALT: 11 U/L (ref 6–29)
AST: 13 U/L (ref 10–30)
Alkaline Phosphatase: 90 U/L (ref 33–115)
BILIRUBIN DIRECT: 0.1 mg/dL (ref <=0.2)
BILIRUBIN TOTAL: 0.4 mg/dL (ref 0.2–1.2)
Indirect Bilirubin: 0.3 mg/dL (ref 0.2–1.2)
Total Protein: 6.8 g/dL (ref 6.1–8.1)

## 2015-10-07 LAB — MAGNESIUM: MAGNESIUM: 1.9 mg/dL (ref 1.5–2.5)

## 2015-10-07 LAB — CBC WITH DIFFERENTIAL/PLATELET
BASOS ABS: 0 10*3/uL (ref 0.0–0.1)
Basophils Relative: 0 % (ref 0–1)
Eosinophils Absolute: 0.1 10*3/uL (ref 0.0–0.7)
Eosinophils Relative: 1 % (ref 0–5)
HEMATOCRIT: 39.1 % (ref 36.0–46.0)
Hemoglobin: 12.9 g/dL (ref 12.0–15.0)
LYMPHS ABS: 2.5 10*3/uL (ref 0.7–4.0)
LYMPHS PCT: 32 % (ref 12–46)
MCH: 28.4 pg (ref 26.0–34.0)
MCHC: 33 g/dL (ref 30.0–36.0)
MCV: 85.9 fL (ref 78.0–100.0)
MPV: 9.4 fL (ref 8.6–12.4)
Monocytes Absolute: 0.4 10*3/uL (ref 0.1–1.0)
Monocytes Relative: 5 % (ref 3–12)
NEUTROS PCT: 62 % (ref 43–77)
Neutro Abs: 4.8 10*3/uL (ref 1.7–7.7)
PLATELETS: 274 10*3/uL (ref 150–400)
RBC: 4.55 MIL/uL (ref 3.87–5.11)
RDW: 13.6 % (ref 11.5–15.5)
WBC: 7.7 10*3/uL (ref 4.0–10.5)

## 2015-10-07 NOTE — Patient Instructions (Signed)
Before you even begin to attack a weight-loss plan, it pays to remember this: You are not fat. You have fat. Losing weight isn't about blame or shame; it's simply another achievement to accomplish. Dieting is like any other skill-you have to buckle down and work at it. As long as you act in a smart, reasonable way, you'll ultimately get where you want to be. Here are some weight loss pearls for you.  1. It's Not a Diet. It's a Lifestyle Thinking of a diet as something you're on and suffering through only for the short term doesn't work. To shed weight and keep it off, you need to make permanent changes to the way you eat. It's OK to indulge occasionally, of course, but if you cut calories temporarily and then revert to your old way of eating, you'll gain back the weight quicker than you can say yo-yo. Use it to lose it. Research shows that one of the best predictors of long-term weight loss is how many pounds you drop in the first month. For that reason, nutritionists often suggest being stricter for the first two weeks of your new eating strategy to build momentum. Cut out added sugar and alcohol and avoid unrefined carbs. After that, figure out how you can reincorporate them in a way that's healthy and maintainable.  2. There's a Right Way to Exercise Working out burns calories and fat and boosts your metabolism by building muscle. But those trying to lose weight are notorious for overestimating the number of calories they burn and underestimating the amount they take in. Unfortunately, your system is biologically programmed to hold on to extra pounds and that means when you start exercising, your body senses the deficit and ramps up its hunger signals. If you're not diligent, you'll eat everything you burn and then some. Use it to lose it. Cardio gets all the exercise glory, but strength and interval training are the real heroes. They help you build lean muscle, which in turn increases your metabolism and  calorie-burning ability 3. Don't Overreact to Mild Hunger Some people have a hard time losing weight because of hunger anxiety. To them, being hungry is bad-something to be avoided at all costs-so they carry snacks with them and eat when they don't need to. Others eat because they're stressed out or bored. While you never want to get to the point of being ravenous (that's when bingeing is likely to happen), a hunger pang, a craving, or the fact that it's 3:00 p.m. should not send you racing for the vending machine or obsessing about the energy bar in your purse. Ideally, you should put off eating until your stomach is growling and it's difficult to concentrate.  Use it to lose it. When you feel the urge to eat, use the HALT method. Ask yourself, Am I really hungry? Or am I angry or anxious, lonely or bored, or tired? If you're still not certain, try the apple test. If you're truly hungry, an apple should seem delicious; if it doesn't, something else is going on. Or you can try drinking water and making yourself busy, if you are still hungry try a healthy snack.  4. Not All Calories Are Created Equal The mechanics of weight loss are pretty simple: Take in fewer calories than you use for energy. But the kind of food you eat makes all the difference. Processed food that's high in saturated fat and refined starch or sugar can cause inflammation that disrupts the hormone signals that tell  your brain you're full. The result: You eat a lot more.  Use it to lose it. Clean up your diet. Swap in whole, unprocessed foods, including vegetables, lean protein, and healthy fats that will fill you up and give you the biggest nutritional bang for your calorie buck. In a few weeks, as your brain starts receiving regular hunger and fullness signals once again, you'll notice that you feel less hungry overall and naturally start cutting back on the amount you eat.  5. Protein, Produce, and Plant-Based Fats Are Your Weight-Loss  Tracy Kidd Here's why eating the three Ps regularly will help you drop pounds. Protein fills you up. You need it to build lean muscle, which keeps your metabolism humming so that you can torch more fat. People in a weight-loss program who ate double the recommended daily allowance for protein (about 110 grams for a 150-pound woman) lost 70 percent of their weight from fat, while people who ate the RDA lost only about 40 percent, one study found. Produce is packed with filling fiber. "It's very difficult to consume too many calories if you're eating a lot of vegetables. Example: Three cups of broccoli is a lot of food, yet only 93 calories. (Fruit is another story. It can be easy to overeat and can contain a lot of calories from sugar, so be sure to monitor your intake.) Plant-based fats like olive oil and those in avocados and nuts are healthy and extra satiating.  Use it to lose it. Aim to incorporate each of the three Ps into every meal and snack. People who eat protein throughout the day are able to keep weight off, according to a study in the American Journal of Clinical Nutrition. In addition to meat, poultry and seafood, good sources are beans, lentils, eggs, tofu, and yogurt. As for fat, keep portion sizes in check by measuring out salad dressing, oil, and nut butters (shoot for one to two tablespoons). Finally, eat veggies or a little fruit at every meal. People who did that consumed 308 fewer calories but didn't feel any hungrier than when they didn't eat more produce.  7. How You Eat Is As Important As What You Eat In order for your brain to register that you're full, you need to focus on what you're eating. Sit down whenever you eat, preferably at a table. Turn off the TV or computer, put down your phone, and look at your food. Smell it. Chew slowly, and don't put another bite on your fork until you swallow. When women ate lunch this attentively, they consumed 30 percent less when snacking later than  those who listened to an audiobook at lunchtime, according to a study in the British Journal of Nutrition. 8. Weighing Yourself Really Works The scale provides the best evidence about whether your efforts are paying off. Seeing the numbers tick up or down or stagnate is motivation to keep going-or to rethink your approach. A 2015 study at [employer] found that daily weigh-ins helped people lose more weight, keep it off, and maintain that loss, even after two years. Use it to lose it. Step on the scale at the same time every day for the best results. If your weight shoots up several pounds from one weigh-in to the next, don't freak out. Eating a lot of salt the night before or having your period is the likely culprit. The number should return to normal in a day or two. It's a steady climb that you need to do something about.   9. Too Much Stress and Too Little Sleep Are Your Enemies When you're tired and frazzled, your body cranks up the production of cortisol, the stress hormone that can cause carb cravings. Not getting enough sleep also boosts your levels of ghrelin, a hormone associated with hunger, while suppressing leptin, a hormone that signals fullness and satiety. People on a diet who slept only five and a half hours a night for two weeks lost 55 percent less fat and were hungrier than those who slept eight and a half hours, according to a study in the [place]. Use it to lose it. Prioritize sleep, aiming for seven hours or more a night, which research shows helps lower stress. And make sure you're getting quality zzz's. If a snoring spouse or a fidgety cat wakes you up frequently throughout the night, you may end up getting the equivalent of just four hours of sleep, according to a study from [facility]. Keep pets out of the bedroom, and use a white-noise app to drown out snoring. 10. You Will Hit a plateau-And You Can Bust Through It As you slim down, your  body releases much less leptin, the fullness hormone.  If you're not strength training, start right now. Building muscle can raise your metabolism to help you overcome a plateau. To keep your body challenged and burning calories, incorporate new moves and more intense intervals into your workouts or add another sweat session to your weekly routine. Alternatively, cut an extra 100 calories or so a day from your diet. Now that you've lost weight, your body simply doesn't need as much fuel.    Vitamin B12 Deficiency Not having enough vitamin B12 is called a deficiency. Vitamin B12 is an important vitamin. Your body needs vitamin B12 to:   Make red blood cells.  Make DNA. This is the genetic material inside all of your cells.  Help your nerves work properly so they can carry messages from your brain to your body. CAUSES  Not eating enough foods that contain vitamin B12.  Not having enough stomach acid and digestive juices. The body needs these to absorb vitamin B12 from the food you eat.  Having certain digestive system diseases that make it hard to absorb vitamin B12. These diseases include Crohn's disease, chronic pancreatitis, and cystic fibrosis.  Having pernicious anemia, which is a condition where the body has too few red blood cells. People with this condition do not make enough of a protein called "intrinsic factor," which is needed to absorb vitamin B12.  Having a surgery in which part of the stomach or small intestine is removed.  Taking certain medicines that make it hard for the body to absorb vitamin B12. These medicines include:  Heartburn medicine (antacids and proton pump inhibitors).  A certain antibiotic medicine called neomycin, which fights infection.  Some medicines used to treat diabetes, tuberculosis, gout, and high cholesterol. RISK FACTORS Risk factors are things that make you more likely to develop a vitamin B12 deficiency. They include:  Being older than  74.  Being a vegetarian.  Being pregnant and a vegetarian or having a poor diet.  Taking certain drugs.  Being an alcoholic. SYMPTOMS You may have a vitamin B12 deficiency with no symptoms. However, a vitamin B12 deficiency can cause health problems like anemia and nerve damage. These health problems can lead to many possible symptoms, including:  Weakness.  Fatigue.  Loss of appetite.  Weight loss.  Numbness or tingling in your hands  and feet.  Redness and burning of the tongue.  Confusion or memory problems.  Depression.  Dizziness.  Sensory problems, such as loss of taste, color blindness, and ringing in the ears.  Diarrhea or constipation.  Trouble walking. DIAGNOSIS Various types of tests can be given to help find the cause of your vitamin B12 deficiency. These tests include:  A complete blood count (CBC). This test gives your caregiver an overall picture of what makes up your blood.  A blood test to measure your B12 level.  A blood test to measure intrinsic factor.  An endoscopy. This procedure uses a thin tube with a camera on the end to look into your stomach or intestines. TREATMENT Treatment for vitamin B12 deficiency depends on what is causing it. Common options include:  Changing your eating and drinking habits, such as:  Eating more foods that contain vitamin B12.  Not drinking as much alcohol or any alcohol.  Taking vitamin B12 supplements. Your caregiver will tell you what dose is best for you.  Getting vitamin B12 injections. Some people get these a few times a week. Others get them once a month. HOME CARE INSTRUCTIONS  Take all supplements as directed by your caregiver. Follow the directions carefully.  Get any injections your caregiver prescribes. Do not miss your appointments.  Eat lots of healthy foods that contain vitamin B12. Ask your caregiver if you should work with a nutritionist. Good things to include in your diet  are:  Meat.  Poultry.  Fish.  Eggs.  Fortified cereal and dairy products. This means vitamin B12 has been added to the food. Check the label on the package to be sure.  Do not abuse alcohol.  Keep all follow-up appointments. Your caregiver will need to perform blood tests to make sure your vitamin B12 deficiency is going away. SEEK MEDICAL CARE IF:  You have any questions about your treatment.  Your symptoms come back. MAKE SURE YOU:  Understand these instructions.  Will watch your condition.  Will get help right away if you are not doing well or get worse.   This information is not intended to replace advice given to you by your health care provider. Make sure you discuss any questions you have with your health care provider.   Document Released: 12/05/2011 Document Reviewed: 01/28/2015 Elsevier Interactive Patient Education [employer].

## 2015-10-07 NOTE — Progress Notes (Signed)
Assessment and Plan:  Hypertension: Continue medication, monitor blood pressure at home. Continue DASH diet.  Reminder to go to the ER if any CP, SOB, nausea, dizziness, severe HA, changes vision/speech, left arm numbness and tingling, and jaw pain. Cholesterol: Continue diet and exercise. Check cholesterol.  Vitamin D Def- check level and continue medications.  Hypothyroidism-check TSH level, continue medications the same, reminded to take on an empty stomach 30-79mins before food.  Obesity with co morbidities- long discussion about weight loss, diet, and exercise  Continue diet and meds as discussed. Further disposition pending results of labs.  HPI 45 y.o. female  presents for 3 month follow up with hypertension, hyperlipidemia, prediabetes and vitamin D. Her blood pressure has been controlled at home, today their BP is BP: 126/74 mmHg She does not workout. She denies chest pain, shortness of breath, dizziness.  She is not on cholesterol medication and denies myalgias. Her cholesterol is at goal. The cholesterol last visit was:   Lab Results  Component Value Date   CHOL 192 04/01/2015   HDL 48 04/01/2015   LDLCALC 111* 04/01/2015   TRIG 164* 04/01/2015   CHOLHDL 4.0 04/01/2015   She has been working on diet and exercise for prediabetes, and denies paresthesia of the feet, polydipsia, polyuria and visual disturbances. Last A1C in the office was:  Lab Results  Component Value Date   HGBA1C 5.2 01/28/2014   Patient is on Vitamin D supplement.   Lab Results  Component Value Date   VD25OH 21* 04/01/2015     She is on thyroid medication. Her medication was not changed last visit. 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  Has low B12, does not take it consistently Lab Results  Component Value Date   VITAMINB12 274 04/01/2015  She has ocular melanoma of left eye x 2014, folllows Dr. Rito Ehrlich, follows up next month, getting injections.  BMI is Body mass index is 31.18 kg/(m^2)., she is working on diet and exercise. Wt Readings from Last 3 Encounters:  10/07/15 176 lb (79.833 kg)  04/01/15 178 lb (80.74 kg)  07/31/14 172 lb (78.019 kg)    Current Medications:  Current Outpatient Prescriptions on File Prior to Visit  Medication Sig Dispense Refill  . B Complex Vitamins (VITAMIN B COMPLEX PO) Take 1 tablet by mouth daily.    . brimonidine-timolol (COMBIGAN) 0.2-0.5 % ophthalmic solution Place 1 drop into the left eye 2 (two) times daily.    Marland Kitchen CALCIUM PO Take 1 tablet by mouth daily.    . cholecalciferol (VITAMIN D) 1000 UNITS tablet Take 2,000 Units by mouth daily.    . Cyanocobalamin (VITAMIN B-12 PO) Take 1 tablet by mouth daily.    . fexofenadine (ALLEGRA) 180 MG tablet Take 180 mg by mouth daily.    . fluocinonide cream (LIDEX) [id number] % Apply 1 application topically daily as needed (for eczema flare ups).    . IRON PO Take 1 tablet by mouth daily.    Marland Kitchen levothyroxine (SYNTHROID, LEVOTHROID) 112 MCG tablet Take 1 tablet (112 mcg total) by mouth daily before breakfast. 90 tablet 1  . MELATONIN PO Take 1 tablet by mouth at bedtime.    . Multiple Vitamin (MULTIVITAMIN WITH MINERALS) TABS  tablet Take 1 tablet by mouth daily.     No current facility-administered medications on file prior to visit.   Medical History:  Past Medical History  Diagnosis Date  . PONV (postoperative nausea and vomiting)     past surgery Scop patch and propofol helped  . Arthritis     hands and feet  . Cancer ([place])     ocular melanoma  . Thyroid disease   . Hypothyroidism    Allergies:  Allergies  Allergen Reactions  . Oxycodone Nausea And Vomiting    Can take morphine and possibly Vicodin   Review of  Systems  Constitutional: Negative.   HENT: Positive for congestion (allergies, on allegra). Negative for ear discharge, ear pain, hearing loss, nosebleeds, sore throat and tinnitus.   Eyes: Positive for blurred vision. Negative for double vision, photophobia, pain, discharge and redness.  Respiratory: Negative.  Negative for stridor.   Cardiovascular: Negative.   Gastrointestinal: Negative.   Genitourinary: Negative.   Musculoskeletal: Positive for joint pain (hands and feet occ). Negative for myalgias, back pain, falls and neck pain.  Skin: Negative.  Negative for itching and rash.  Neurological: Negative.  Negative for headaches.  Endo/Heme/Allergies: Negative.   Psychiatric/Behavioral: Negative.      Family history- Review and unchanged Social history- Review and unchanged Physical Exam: BP 126/74 mmHg  Pulse 77  Temp(Src) 97.3 F (36.3 C) (Temporal)  Resp 16  Ht 5\' 3"  (1.6 m)  Wt 176 lb (79.833 kg)  BMI 31.18 kg/m2  SpO2 98%  LMP 09/23/2015 Wt Readings from Last 3 Encounters:  10/07/15 176 lb (79.833 kg)  04/01/15 178 lb (80.74 kg)  07/31/14 172 lb (78.019 kg)   General Appearance: Well nourished, in no apparent distress. Eyes: PERRLA, EOMs, conjunctiva no swelling or erythema Sinuses: No Frontal/maxillary tenderness ENT/Mouth: Ext aud canals clear, TMs without erythema, bulging. No erythema, swelling, or exudate on post pharynx.  Tonsils not swollen or erythematous. Hearing normal.  Neck: Supple, thyroid normal.  Respiratory: Respiratory effort normal, BS equal bilaterally without rales, rhonchi, wheezing or stridor.  Cardio: RRR with no MRGs. Brisk peripheral pulses without edema.  Abdomen: Soft, + BS.  Non tender, no guarding, rebound, hernias, masses. Lymphatics: Non tender without lymphadenopathy.  Musculoskeletal: Full ROM, 5/5 strength, normal gait.  Skin: Warm, dry without rashes, lesions, ecchymosis.  Neuro: Cranial nerves intact. Normal muscle tone, no  cerebellar symptoms. Sensation intact.  Psych: Awake and oriented X 3, normal affect, Insight and Judgment appropriate.    Vicie Mutters, PA-C 3:00 PM [facility]

## 2015-10-08 LAB — TSH: TSH: 2.515 u[IU]/mL (ref 0.350–4.500)

## 2015-10-08 LAB — HEMOGLOBIN A1C
HEMOGLOBIN A1C: 5.5 % (ref <5.7)
MEAN PLASMA GLUCOSE: 111 mg/dL (ref <117)

## 2015-10-08 LAB — VITAMIN D 25 HYDROXY (VIT D DEFICIENCY, FRACTURES): Vit D, 25-Hydroxy: 23 ng/mL — ABNORMAL LOW (ref 30–100)

## 2015-10-08 LAB — VITAMIN B12: Vitamin B-12: 298 pg/mL (ref 211–911)

## 2015-10-12 MED FILL — SYNTHROID 112 MCG TABLET: 112 | 90 days supply | Qty: 90 | Fill #4

## 2015-11-04 DIAGNOSIS — C6932 Malignant neoplasm of left choroid: Secondary | ICD-10-CM | POA: Diagnosis not present

## 2015-11-04 DIAGNOSIS — H35413 Lattice degeneration of retina, bilateral: Secondary | ICD-10-CM | POA: Diagnosis not present

## 2015-11-04 DIAGNOSIS — H35382 Toxic maculopathy, left eye: Secondary | ICD-10-CM | POA: Diagnosis not present

## 2015-11-04 DIAGNOSIS — H33192 Other retinoschisis and retinal cysts, left eye: Secondary | ICD-10-CM | POA: Diagnosis not present

## 2015-11-04 DIAGNOSIS — Q103 Other congenital malformations of eyelid: Secondary | ICD-10-CM | POA: Diagnosis not present

## 2015-11-04 DIAGNOSIS — T508X5A Adverse effect of diagnostic agents, initial encounter: Secondary | ICD-10-CM | POA: Diagnosis not present

## 2015-11-18 DIAGNOSIS — H02051 Trichiasis without entropian right upper eyelid: Secondary | ICD-10-CM | POA: Diagnosis not present

## 2015-11-18 DIAGNOSIS — H5213 Myopia, bilateral: Secondary | ICD-10-CM | POA: Diagnosis not present

## 2015-11-18 DIAGNOSIS — H02054 Trichiasis without entropian left upper eyelid: Secondary | ICD-10-CM | POA: Diagnosis not present

## 2015-12-29 DIAGNOSIS — D2372 Other benign neoplasm of skin of left lower limb, including hip: Secondary | ICD-10-CM | POA: Diagnosis not present

## 2015-12-29 DIAGNOSIS — D485 Neoplasm of uncertain behavior of skin: Secondary | ICD-10-CM | POA: Diagnosis not present

## 2015-12-29 DIAGNOSIS — D225 Melanocytic nevi of trunk: Secondary | ICD-10-CM | POA: Diagnosis not present

## 2015-12-29 DIAGNOSIS — D2262 Melanocytic nevi of left upper limb, including shoulder: Secondary | ICD-10-CM | POA: Diagnosis not present

## 2015-12-29 DIAGNOSIS — D2272 Melanocytic nevi of left lower limb, including hip: Secondary | ICD-10-CM | POA: Diagnosis not present

## 2015-12-29 DIAGNOSIS — D2362 Other benign neoplasm of skin of left upper limb, including shoulder: Secondary | ICD-10-CM | POA: Diagnosis not present

## 2015-12-29 DIAGNOSIS — D2261 Melanocytic nevi of right upper limb, including shoulder: Secondary | ICD-10-CM | POA: Diagnosis not present

## 2016-01-01 MED FILL — COMBIGAN EYE DROPS: 0.2-0.5 | 90 days supply | Qty: 10 | Fill #0

## 2016-01-13 ENCOUNTER — Other Ambulatory Visit: Payer: Self-pay

## 2016-01-13 DIAGNOSIS — C6932 Malignant neoplasm of left choroid: Secondary | ICD-10-CM | POA: Diagnosis not present

## 2016-01-13 DIAGNOSIS — H35352 Cystoid macular degeneration, left eye: Secondary | ICD-10-CM | POA: Diagnosis not present

## 2016-01-13 DIAGNOSIS — Q103 Other congenital malformations of eyelid: Secondary | ICD-10-CM | POA: Diagnosis not present

## 2016-01-13 MED ORDER — LEVOTHYROXINE SODIUM 112 MCG PO TABS: 112.0000 ug | ORAL_TABLET | Freq: Every day | ORAL | Status: DC

## 2016-01-13 MED FILL — SYNTHROID 112 MCG TABLET: 112 | 90 days supply | Qty: 90 | Fill #0

## 2016-03-31 ENCOUNTER — Encounter: Payer: Self-pay | Admitting: Physician Assistant

## 2016-04-04 ENCOUNTER — Encounter: Payer: Self-pay | Admitting: Physician Assistant

## 2016-04-18 MED FILL — SYNTHROID 112 MCG TABLET: 112 | 90 days supply | Qty: 90 | Fill #1

## 2016-04-27 MED FILL — COMBIGAN EYE DROPS: 0.2-0.5 | 90 days supply | Qty: 10 | Fill #1

## 2016-04-28 DIAGNOSIS — H43811 Vitreous degeneration, right eye: Secondary | ICD-10-CM | POA: Diagnosis not present

## 2016-05-09 ENCOUNTER — Ambulatory Visit (INDEPENDENT_AMBULATORY_CARE_PROVIDER_SITE_OTHER): Payer: 59 | Admitting: Physician Assistant

## 2016-05-09 ENCOUNTER — Encounter: Payer: Self-pay | Admitting: Physician Assistant

## 2016-05-09 VITALS — BP 118/70 | HR 81 | Temp 97.7°F | Resp 14 | Ht 63.0 in | Wt 179.2 lb

## 2016-05-09 DIAGNOSIS — E559 Vitamin D deficiency, unspecified: Secondary | ICD-10-CM | POA: Diagnosis not present

## 2016-05-09 DIAGNOSIS — Z79899 Other long term (current) drug therapy: Secondary | ICD-10-CM

## 2016-05-09 DIAGNOSIS — C6932 Malignant neoplasm of left choroid: Secondary | ICD-10-CM

## 2016-05-09 DIAGNOSIS — D649 Anemia, unspecified: Secondary | ICD-10-CM | POA: Diagnosis not present

## 2016-05-09 DIAGNOSIS — E890 Postprocedural hypothyroidism: Secondary | ICD-10-CM

## 2016-05-09 DIAGNOSIS — M129 Arthropathy, unspecified: Secondary | ICD-10-CM

## 2016-05-09 DIAGNOSIS — Z131 Encounter for screening for diabetes mellitus: Secondary | ICD-10-CM

## 2016-05-09 DIAGNOSIS — L409 Psoriasis, unspecified: Secondary | ICD-10-CM

## 2016-05-09 DIAGNOSIS — H4062X0 Glaucoma secondary to drugs, left eye, stage unspecified: Secondary | ICD-10-CM

## 2016-05-09 DIAGNOSIS — E538 Deficiency of other specified B group vitamins: Secondary | ICD-10-CM

## 2016-05-09 DIAGNOSIS — E785 Hyperlipidemia, unspecified: Secondary | ICD-10-CM

## 2016-05-09 DIAGNOSIS — Z0001 Encounter for general adult medical examination with abnormal findings: Secondary | ICD-10-CM

## 2016-05-09 DIAGNOSIS — R6889 Other general symptoms and signs: Secondary | ICD-10-CM

## 2016-05-09 LAB — CBC WITH DIFFERENTIAL/PLATELET
BASOS PCT: 0 %
Basophils Absolute: 0 cells/uL (ref 0–200)
Eosinophils Absolute: 83 cells/uL (ref 15–500)
Eosinophils Relative: 1 %
HEMATOCRIT: 38.1 % (ref 35.0–45.0)
HEMOGLOBIN: 12.6 g/dL (ref 11.7–15.5)
LYMPHS ABS: 2739 {cells}/uL (ref 850–3900)
MCH: 28.7 pg (ref 27.0–33.0)
MCHC: 33.1 g/dL (ref 32.0–36.0)
MCV: 86.8 fL (ref 80.0–100.0)
MPV: 9.6 fL (ref 7.5–12.5)
Monocytes Absolute: 415 cells/uL (ref 200–950)
Monocytes Relative: 5 %
NEUTROS ABS: 5063 {cells}/uL (ref 1500–7800)
Neutrophils Relative %: 61 %
Platelets: 260 10*3/uL (ref 140–400)
RBC: 4.39 MIL/uL (ref 3.80–5.10)
RDW: 13.8 % (ref 11.0–15.0)
WBC: 8.3 10*3/uL (ref 3.8–10.8)

## 2016-05-09 LAB — HEPATIC FUNCTION PANEL
ALK PHOS: 87 U/L (ref 33–115)
ALT: 11 U/L (ref 6–29)
AST: 12 U/L (ref 10–30)
Albumin: 4.4 g/dL (ref 3.6–5.1)
BILIRUBIN DIRECT: 0.1 mg/dL (ref <=0.2)
BILIRUBIN INDIRECT: 0.3 mg/dL (ref 0.2–1.2)
TOTAL PROTEIN: 7.1 g/dL (ref 6.1–8.1)
Total Bilirubin: 0.4 mg/dL (ref 0.2–1.2)

## 2016-05-09 LAB — BASIC METABOLIC PANEL WITH GFR
BUN: 12 mg/dL (ref 7–25)
CO2: 24 mmol/L (ref 20–31)
Calcium: 9.3 mg/dL (ref 8.6–10.2)
Chloride: 103 mmol/L (ref 98–110)
Creat: 0.7 mg/dL (ref 0.50–1.10)
GLUCOSE: 81 mg/dL (ref 65–99)
POTASSIUM: 4 mmol/L (ref 3.5–5.3)
SODIUM: 138 mmol/L (ref 135–146)

## 2016-05-09 LAB — MAGNESIUM: MAGNESIUM: 2 mg/dL (ref 1.5–2.5)

## 2016-05-09 LAB — LIPID PANEL
CHOL/HDL RATIO: 4.2 ratio (ref <=5.0)
Cholesterol: 180 mg/dL (ref 125–200)
HDL: 43 mg/dL — ABNORMAL LOW (ref >=46)
LDL CALC: 101 mg/dL (ref <130)
TRIGLYCERIDES: 181 mg/dL — AB (ref <150)
VLDL: 36 mg/dL — AB (ref <30)

## 2016-05-09 LAB — GAMMA GT: GGT: 14 U/L (ref 7–51)

## 2016-05-09 LAB — VITAMIN B12: VITAMIN B 12: 300 pg/mL (ref 200–1100)

## 2016-05-09 LAB — TSH: TSH: 3.74 mIU/L

## 2016-05-09 NOTE — Patient Instructions (Signed)

## 2016-05-09 NOTE — Progress Notes (Signed)
Complete Physical  Assessment and Plan: 1. Hypothyroidism, unspecified hypothyroidism type Hypothyroidism-check TSH level, continue medications the same, reminded to take on an empty stomach 30-54mins before food.  - TSH  2. Malignant neoplasm of choroid, left Will send labs to Dr. Rito Ehrlich  - Hepatic function panel - DG Chest 2 View; Future - Gamma GT - CT Abdomen Pelvis W Contrast; Future  3. Vitamin D deficiency - Vit D  25 hydroxy (rtn osteoporosis monitoring)  4. Hyperlipidemia - Lipid panel - Urinalysis, Routine w reflex microscopic (not at [facility]) - Microalbumin / creatinine urine ratio  5. Medication management - CBC with Differential/Platelet - BASIC METABOLIC PANEL WITH GFR - Magnesium  6. Anemia, unspecified anemia type - monitor, continue iron supp with Vitamin C and increase green leafy veggies  7. B12 deficiency - Vitamin B12  8. Corticosteroid-induced glaucoma, left, stage unspecified Continue to follow up eye doctor  9. Psoriasis No rash at this time, monitor  10. Arthropathia Monitor, controlled for now, if worse may need to send RA  11. Routine general medical examination at a health care facility    Discussed med's effects and SE's. Screening labs and tests as requested with regular follow-up as recommended.  HPI 45 y.o. female  presents for a complete physical. Her blood pressure has been controlled at home, today their BP is BP: 118/70 She does workout, walking. She denies chest pain, shortness of breath, dizziness.  She is not on cholesterol medication and denies myalgias. Her cholesterol is at goal. The cholesterol last visit was:  Lab Results  Component Value Date   CHOL 191 10/07/2015   HDL 46 10/07/2015   LDLCALC 120 10/07/2015   TRIG 123 10/07/2015   CHOLHDL 4.2 10/07/2015    Last A1C in the office was:  Lab Results  Component Value Date   HGBA1C 5.5 10/07/2015   Patient is on Vitamin D supplement, but very bad about  taking it.  Lab Results  Component Value Date   VD25OH 23 (L) 10/07/2015   She has a B12 but does not take it regularly, takes sublingual once every 2 weeks.  Lab Results  Component Value Date   VITAMINB12 298 10/07/2015   She is on thyroid medication. Her medication was not changed last visit.   Lab Results  Component Value Date   TSH 2.515 10/07/2015  .  She was diagnosed with ocular melanoma, left eye in 2014 and has been seeing a specialist at [facility], Dr. Rito Ehrlich but now following with Dr. Daralene Milch, she had radiation and recently had increased pressure in her left eye and seeing Dr. Celene Squibb.  She gets METS surveillance yearly and is due for CT AB with contrast (08/16) , LFTs/GTT q 6 months and CXR (04/2015).  DX code 190.6 Had Pap with Dr. Marvel Plan.  She is also seeing Dr. Martinique every 3 months for skin checks. Hit the 20 year mark at her job this year. BMI is Body mass index is 31.74 kg/m., she is working on diet and exercise. Wt Readings from Last 3 Encounters:  05/09/16 179 lb 3.2 oz (81.3 kg)  10/07/15 176 lb (79.8 kg)  04/01/15 178 lb (80.7 kg)      Current Medications:  Current Outpatient Prescriptions on File Prior to Visit  Medication Sig Dispense Refill  . B Complex CAPS Take by mouth.    . B Complex Vitamins (VITAMIN B COMPLEX PO) Take 1 tablet by mouth daily.    . brimonidine-timolol (COMBIGAN) 0.2-0.5 % ophthalmic solution  Place 1 drop into the left eye 2 (two) times daily.    . calcium carbonate (OS-CAL - DOSED IN MG OF ELEMENTAL CALCIUM) 1250 (500 Ca) MG tablet Take by mouth.    Marland Kitchen CALCIUM PO Take 1 tablet by mouth daily.    . carboxymethylcellulose (REFRESH PLUS) 0.5 % SOLN Apply to eye.    . cholecalciferol (VITAMIN D) 1000 UNITS tablet Take 2,000 Units by mouth daily.    . Cyanocobalamin (VITAMIN B-12 PO) Take 1 tablet by mouth daily.    . ferrous sulfate 325 (65 FE) MG tablet Take by mouth.    . fexofenadine (ALLEGRA) 180 MG tablet Take 180 mg by mouth  daily.    . IRON PO Take 1 tablet by mouth daily.    Marland Kitchen levothyroxine (SYNTHROID, LEVOTHROID) 112 MCG tablet Take 1 tablet (112 mcg total) by mouth daily before breakfast. 90 tablet 1  . MELATONIN PO Take 1 tablet by mouth at bedtime.    . Multiple Vitamin (MULTIVITAMIN WITH MINERALS) TABS tablet Take 1 tablet by mouth daily.    . Turmeric (RA TURMERIC) 500 MG CAPS Take by mouth.    . ALPRAZolam (XANAX) 1 MG tablet Take by mouth.     No current facility-administered medications on file prior to visit.    Health Maintenance:   Immunization History  Administered Date(s) Administered  . Tdap 08/12/2014   TDAP: 2015 Pneumovax: N/A Prevnar 13: N/A Flu vaccine: 2014 Zostavax: N/A Pap: 2016 q 5 years MGM: 12/2014 gets at Dr. Marvel Plan DEXA: N/A Colonoscopy: EGD:  Medical History:  Past Medical History:  Diagnosis Date  . Arthritis    hands and feet  . Cancer ([place])    ocular melanoma  . Hypothyroidism   . PONV (postoperative nausea and vomiting)    past surgery Scop patch and propofol helped  . Thyroid disease    Allergies Allergies  Allergen Reactions  . Oxycodone Nausea And Vomiting    Can take morphine and possibly Vicodin   SURGICAL HISTORY She  has a past surgical history that includes Wisdom tooth extraction; Eye surgery; and laparoscopy (N/A, 09/13/2013). FAMILY HISTORY Her family history includes Arthritis in her mother; Cancer in her maternal grandmother and mother; Heart disease in her maternal grandfather; Hyperlipidemia in her father; Nephrolithiasis in her father; Varicose Veins in her mother. SOCIAL HISTORY She  reports that she has never smoked. She has never used smokeless tobacco. She reports that she drinks alcohol. She reports that she does not use drugs.  Review of Systems  Constitutional: Negative.   HENT: Positive for congestion (allergies, on allegra). Negative for ear discharge, ear pain, hearing loss, nosebleeds, sore throat and tinnitus.   Eyes:  Positive for blurred vision. Negative for double vision, photophobia, pain, discharge and redness.  Respiratory: Negative.  Negative for stridor.   Cardiovascular: Negative.   Gastrointestinal: Negative.   Genitourinary: Negative.   Musculoskeletal: Positive for joint pain (hands and feet occ). Negative for back pain, falls, myalgias and neck pain.  Skin: Negative.  Negative for itching and rash.  Neurological: Negative.  Negative for headaches.  Endo/Heme/Allergies: Negative.   Psychiatric/Behavioral: Negative.     Physical Exam: Estimated body mass index is 31.74 kg/m as calculated from the following:   Height as of this encounter: 5\' 3"  (1.6 m).   Weight as of this encounter: 179 lb 3.2 oz (81.3 kg). BP 118/70   Pulse 81   Temp 97.7 F (36.5 C)   Resp 14   Ht  5\' 3"  (1.6 m)   Wt 179 lb 3.2 oz (81.3 kg)   LMP 04/20/2016   SpO2 96%   BMI 31.74 kg/m  General Appearance: Well nourished, in no apparent distress. Eyes: PERRLA, EOMs, left eye ptosis improved, conjunctiva no swelling or erythema Sinuses: No Frontal/maxillary tenderness ENT/Mouth: Ext aud canals clear, normal light reflex with TMs without erythema, bulging.  Good dentition. No erythema, swelling, or exudate on post pharynx. Tonsils not swollen or erythematous. Hearing normal.  Neck: Supple, thyroid normal. No bruits Respiratory: Respiratory effort normal, BS equal bilaterally without rales, rhonchi, wheezing or stridor. Cardio: RRR without murmurs, rubs or gallops. Brisk peripheral pulses without edema.  Chest: symmetric, with normal excursions and percussion. Breasts: defer Abdomen: Soft, +BS. Non tender, no guarding, rebound, hernias, masses, or organomegaly.  Lymphatics: Non tender without lymphadenopathy.  Genitourinary: defer Musculoskeletal: Full ROM all peripheral extremities,5/5 strength, no deformities, joint swelling, redness hands and feet, and normal gait. Skin: Warm, dry without rashes, lesions,  ecchymosis.  Neuro: Cranial nerves intact, reflexes equal bilaterally. Normal muscle tone, no cerebellar symptoms. Sensation intact.  Psych: Awake and oriented X 3, normal affect, Insight and Judgment appropriate.   EKG: declines   Vicie Mutters 3:19 PM

## 2016-05-10 ENCOUNTER — Ambulatory Visit (HOSPITAL_COMMUNITY)
Admission: RE | Admit: 2016-05-10 | Discharge: 2016-05-10 | Disposition: A | Discharge status: Home / Self Care | Payer: 59 | Source: Ambulatory Visit | Attending: Physician Assistant | Admitting: Physician Assistant

## 2016-05-10 DIAGNOSIS — D3130 Benign neoplasm of unspecified choroid: Secondary | ICD-10-CM | POA: Diagnosis not present

## 2016-05-10 DIAGNOSIS — C6932 Malignant neoplasm of left choroid: Secondary | ICD-10-CM

## 2016-05-10 LAB — HEMOGLOBIN A1C
Hgb A1c MFr Bld: 5.2 % (ref <5.7)
Mean Plasma Glucose: 103 mg/dL

## 2016-05-10 LAB — VITAMIN D 25 HYDROXY (VIT D DEFICIENCY, FRACTURES): Vit D, 25-Hydroxy: 28 ng/mL — ABNORMAL LOW (ref 30–100)

## 2016-05-17 ENCOUNTER — Ambulatory Visit
Admission: RE | Admit: 2016-05-17 | Discharge: 2016-05-17 | Disposition: A | Discharge status: Home / Self Care | Payer: 59 | Source: Ambulatory Visit | Attending: Physician Assistant | Admitting: Physician Assistant

## 2016-05-17 DIAGNOSIS — D259 Leiomyoma of uterus, unspecified: Secondary | ICD-10-CM | POA: Diagnosis not present

## 2016-05-17 DIAGNOSIS — C6932 Malignant neoplasm of left choroid: Secondary | ICD-10-CM

## 2016-05-17 MED ORDER — IOPAMIDOL (ISOVUE-300) INJECTION 61%
100.0000 mL | Freq: Once | INTRAVENOUS | Status: AC | PRN
  Administered 2016-05-17: 100 mL via INTRAVENOUS

## 2016-05-18 DIAGNOSIS — C6932 Malignant neoplasm of left choroid: Secondary | ICD-10-CM | POA: Diagnosis not present

## 2016-05-18 DIAGNOSIS — H35352 Cystoid macular degeneration, left eye: Secondary | ICD-10-CM | POA: Diagnosis not present

## 2016-07-04 DIAGNOSIS — H40052 Ocular hypertension, left eye: Secondary | ICD-10-CM | POA: Diagnosis not present

## 2016-07-08 ENCOUNTER — Other Ambulatory Visit: Payer: Self-pay | Admitting: Physician Assistant

## 2016-07-08 MED FILL — SYNTHROID 112 MCG TABLET: 112 | 90 days supply | Qty: 90 | Fill #0

## 2016-08-23 DIAGNOSIS — Z1231 Encounter for screening mammogram for malignant neoplasm of breast: Secondary | ICD-10-CM | POA: Diagnosis not present

## 2016-08-23 DIAGNOSIS — Z6832 Body mass index (BMI) 32.0-32.9, adult: Secondary | ICD-10-CM | POA: Diagnosis not present

## 2016-08-23 DIAGNOSIS — Z01419 Encounter for gynecological examination (general) (routine) without abnormal findings: Secondary | ICD-10-CM | POA: Diagnosis not present

## 2016-08-23 DIAGNOSIS — Z1389 Encounter for screening for other disorder: Secondary | ICD-10-CM | POA: Diagnosis not present

## 2016-08-23 DIAGNOSIS — Z130 Encounter for screening for diseases of the blood and blood-forming organs and certain disorders involving the immune mechanism: Secondary | ICD-10-CM | POA: Diagnosis not present

## 2016-10-04 DIAGNOSIS — D2261 Melanocytic nevi of right upper limb, including shoulder: Secondary | ICD-10-CM | POA: Diagnosis not present

## 2016-10-04 DIAGNOSIS — D2372 Other benign neoplasm of skin of left lower limb, including hip: Secondary | ICD-10-CM | POA: Diagnosis not present

## 2016-10-04 DIAGNOSIS — D2272 Melanocytic nevi of left lower limb, including hip: Secondary | ICD-10-CM | POA: Diagnosis not present

## 2016-10-04 DIAGNOSIS — D2262 Melanocytic nevi of left upper limb, including shoulder: Secondary | ICD-10-CM | POA: Diagnosis not present

## 2016-10-04 DIAGNOSIS — D225 Melanocytic nevi of trunk: Secondary | ICD-10-CM | POA: Diagnosis not present

## 2016-10-04 DIAGNOSIS — L821 Other seborrheic keratosis: Secondary | ICD-10-CM | POA: Diagnosis not present

## 2016-10-05 DIAGNOSIS — H35352 Cystoid macular degeneration, left eye: Secondary | ICD-10-CM | POA: Diagnosis not present

## 2016-10-05 DIAGNOSIS — C6932 Malignant neoplasm of left choroid: Secondary | ICD-10-CM | POA: Diagnosis not present

## 2016-10-17 MED FILL — SYNTHROID 112 MCG TABLET: 112 | 90 days supply | Qty: 90 | Fill #1

## 2016-11-16 ENCOUNTER — Other Ambulatory Visit: Payer: Self-pay | Admitting: Physician Assistant

## 2016-11-16 ENCOUNTER — Ambulatory Visit (INDEPENDENT_AMBULATORY_CARE_PROVIDER_SITE_OTHER): Payer: 59 | Admitting: Physician Assistant

## 2016-11-16 ENCOUNTER — Encounter: Payer: Self-pay | Admitting: Physician Assistant

## 2016-11-16 VITALS — BP 122/68 | HR 71 | Temp 97.0°F | Resp 16 | Ht 63.0 in | Wt 181.4 lb

## 2016-11-16 DIAGNOSIS — C6932 Malignant neoplasm of left choroid: Secondary | ICD-10-CM | POA: Diagnosis not present

## 2016-11-16 DIAGNOSIS — Z79899 Other long term (current) drug therapy: Secondary | ICD-10-CM | POA: Diagnosis not present

## 2016-11-16 DIAGNOSIS — Z0001 Encounter for general adult medical examination with abnormal findings: Secondary | ICD-10-CM

## 2016-11-16 DIAGNOSIS — E890 Postprocedural hypothyroidism: Secondary | ICD-10-CM

## 2016-11-16 DIAGNOSIS — E559 Vitamin D deficiency, unspecified: Secondary | ICD-10-CM

## 2016-11-16 DIAGNOSIS — E785 Hyperlipidemia, unspecified: Secondary | ICD-10-CM

## 2016-11-16 LAB — CBC WITH DIFFERENTIAL/PLATELET
BASOS ABS: 0 {cells}/uL (ref 0–200)
Basophils Relative: 0 %
EOS ABS: 69 {cells}/uL (ref 15–500)
Eosinophils Relative: 1 %
HCT: 37.9 % (ref 35.0–45.0)
HEMOGLOBIN: 12.6 g/dL (ref 11.7–15.5)
LYMPHS ABS: 2277 {cells}/uL (ref 850–3900)
Lymphocytes Relative: 33 %
MCH: 28.4 pg (ref 27.0–33.0)
MCHC: 33.2 g/dL (ref 32.0–36.0)
MCV: 85.6 fL (ref 80.0–100.0)
MPV: 9.2 fL (ref 7.5–12.5)
Monocytes Absolute: 345 cells/uL (ref 200–950)
Monocytes Relative: 5 %
NEUTROS ABS: 4209 {cells}/uL (ref 1500–7800)
NEUTROS PCT: 61 %
Platelets: 266 10*3/uL (ref 140–400)
RBC: 4.43 MIL/uL (ref 3.80–5.10)
RDW: 14 % (ref 11.0–15.0)
WBC: 6.9 10*3/uL (ref 3.8–10.8)

## 2016-11-16 NOTE — Patient Instructions (Addendum)
Can get synthroid direct is supported by [facility] in [place], [place]. It is a way to get BRAND NAME synthroid sent directly to your house. I will send a prescription to a mail order pharmacy, you need to call the pharmacy at [phone] or go online to [email or web address] to get started.   Cost:  Pay 25$ for a one month supply Pay 45$ for a two month supply Pay 65$ for a 3 month supply and save 10$  [facility] [address], [place] [phone] Can get brand name cheap from here  

## 2016-11-16 NOTE — Progress Notes (Signed)
Assessment and Plan:   Hypothyroidism following radioiodine therapy Want between 1-2 Hypothyroidism-check TSH level, continue medications the same, reminded to take on an empty stomach 30-23mins before food.  -     TSH  Malignant neoplasm of left choroid (HCC) Still following up with [facility]  Hyperlipidemia, unspecified hyperlipidemia type -     Lipid panel  Medication management -     CBC with Differential/Platelet -     BASIC METABOLIC PANEL WITH GFR -     Hepatic function panel -     Magnesium    Continue diet and meds as discussed. Further disposition pending results of labs.  HPI 46 y.o. female  presents for 3 month follow up with hypertension, hyperlipidemia, prediabetes and vitamin D. Her blood pressure has been controlled at home, today their BP is BP: 122/68 She does not workout. She denies chest pain, shortness of breath, dizziness.  She is not on cholesterol medication and denies myalgias. Her cholesterol is at goal. The cholesterol last visit was:   Lab Results  Component Value Date   CHOL 180 05/09/2016   HDL 43 (L) 05/09/2016   LDLCALC 101 05/09/2016   TRIG 181 (H) 05/09/2016   CHOLHDL 4.2 05/09/2016   She has been working on diet and exercise for prediabetes, and denies paresthesia of the feet, polydipsia, polyuria and visual disturbances. Last A1C in the office was:  Lab Results  Component Value Date   HGBA1C 5.2 05/09/2016   Patient is on Vitamin D supplement.   Lab Results  Component Value Date   VD25OH 28 (L) 05/09/2016     She is on thyroid medication 112 mcg QD. Her medication was not changed last visit. Patient denies denies fatigue, weight changes, heat/cold intolerance, bowel/skin changes or CVS symptoms  Lab Results  Component Value Date   TSH 3.74 05/09/2016   Has low B12, does not take it consistently Lab Results  Component Value Date   VITAMINB12 300 05/09/2016   She has ocular melanoma of left eye x 2014, folllows Dr. Rito Ehrlich at  [facility], not getting injections anymore, q 4 months BMI is Body mass index is 32.13 kg/m., she is working on diet and exercise. Wt Readings from Last 3 Encounters:  11/16/16 181 lb 6.4 oz (82.3 kg)  05/09/16 179 lb 3.2 oz (81.3 kg)  10/07/15 176 lb (79.8 kg)    Current Medications:  Current Outpatient Prescriptions on File Prior to Visit  Medication Sig Dispense Refill  . CALCIUM PO Take 1 tablet by mouth daily.    . carboxymethylcellulose (REFRESH PLUS) 0.5 % SOLN Apply to eye.    . cholecalciferol (VITAMIN D) 1000 UNITS tablet Take 2,000 Units by mouth daily.    . Cyanocobalamin (VITAMIN B-12 PO) Take 1 tablet by mouth daily.    . ferrous sulfate 325 (65 FE) MG tablet Take by mouth.    . fexofenadine (ALLEGRA) 180 MG tablet Take 180 mg by mouth daily.    Marland Kitchen MELATONIN PO Take 1 tablet by mouth at bedtime.    Marland Kitchen SYNTHROID 112 MCG tablet TAKE 1 TABLET BY MOUTH DAILY BEFORE BREAKFAST. 90 tablet 1  . brimonidine-timolol (COMBIGAN) 0.2-0.5 % ophthalmic solution Place 1 drop into the left eye 2 (two) times daily.     No current facility-administered medications on file prior to visit.    Medical History:  Past Medical History:  Diagnosis Date  . Arthritis    hands and feet  . Cancer ([place])    ocular melanoma  .  Hypothyroidism   . PONV (postoperative nausea and vomiting)    past surgery Scop patch and propofol helped  . Thyroid disease    Allergies:  Allergies  Allergen Reactions  . Oxycodone Nausea And Vomiting    Can take morphine and possibly Vicodin   Review of Systems  Constitutional: Negative.   HENT: Negative for congestion, ear discharge, ear pain, hearing loss, nosebleeds, sore throat and tinnitus.   Eyes: Positive for blurred vision. Negative for double vision, photophobia, pain, discharge and redness.  Respiratory: Negative.  Negative for stridor.   Cardiovascular: Negative.   Gastrointestinal: Negative.   Genitourinary: Negative.   Musculoskeletal: Positive for joint  pain (hands and feet occ). Negative for back pain, falls, myalgias and neck pain.  Skin: Negative.  Negative for itching and rash.  Neurological: Negative.  Negative for headaches.  Endo/Heme/Allergies: Negative.   Psychiatric/Behavioral: Negative.     Family history- Review and unchanged Social history- Review and unchanged Physical Exam: BP 122/68   Pulse 71   Temp 97 F (36.1 C)   Resp 16   Ht 5\' 3"  (1.6 m)   Wt 181 lb 6.4 oz (82.3 kg)   LMP 11/02/2016   SpO2 98%   BMI 32.13 kg/m  Wt Readings from Last 3 Encounters:  11/16/16 181 lb 6.4 oz (82.3 kg)  05/09/16 179 lb 3.2 oz (81.3 kg)  10/07/15 176 lb (79.8 kg)   General Appearance: Well nourished, in no apparent distress. Eyes: PERRLA, EOMs, conjunctiva no swelling or erythema Sinuses: No Frontal/maxillary tenderness ENT/Mouth: Ext aud canals clear, TMs without erythema, bulging. No erythema, swelling, or exudate on post pharynx.  Tonsils not swollen or erythematous. Hearing normal.  Neck: Supple, thyroid normal.  Respiratory: Respiratory effort normal, BS equal bilaterally without rales, rhonchi, wheezing or stridor.  Cardio: RRR with no MRGs. Brisk peripheral pulses without edema.  Abdomen: Soft, + BS.  Non tender, no guarding, rebound, hernias, masses. Lymphatics: Non tender without lymphadenopathy.  Musculoskeletal: Full ROM, 5/5 strength, normal gait.  Skin: Warm, dry without rashes, lesions, ecchymosis.  Neuro: Cranial nerves intact. Normal muscle tone, no cerebellar symptoms. Sensation intact.  Psych: Awake and oriented X 3, normal affect, Insight and Judgment appropriate.    Vicie Mutters, PA-C 3:23 PM [facility]

## 2016-11-17 LAB — LIPID PANEL
CHOL/HDL RATIO: 4.4 ratio (ref <5.0)
CHOLESTEROL: 206 mg/dL — AB (ref <200)
HDL: 47 mg/dL — ABNORMAL LOW (ref >50)
LDL Cholesterol: 132 mg/dL — ABNORMAL HIGH (ref <100)
Triglycerides: 136 mg/dL (ref <150)
VLDL: 27 mg/dL (ref <30)

## 2016-11-17 LAB — BASIC METABOLIC PANEL WITH GFR
BUN: 13 mg/dL (ref 7–25)
CHLORIDE: 106 mmol/L (ref 98–110)
CO2: 22 mmol/L (ref 20–31)
CREATININE: 0.68 mg/dL (ref 0.50–1.10)
Calcium: 9.7 mg/dL (ref 8.6–10.2)
GFR, Est African American: >89 mL/min (ref >=60)
GFR, Est Non African American: >89 mL/min (ref >=60)
GLUCOSE: 79 mg/dL (ref 65–99)
Potassium: 4.1 mmol/L (ref 3.5–5.3)
Sodium: 139 mmol/L (ref 135–146)

## 2016-11-17 LAB — HEPATIC FUNCTION PANEL
ALBUMIN: 4.3 g/dL (ref 3.6–5.1)
ALT: 11 U/L (ref 6–29)
AST: 14 U/L (ref 10–30)
Alkaline Phosphatase: 91 U/L (ref 33–115)
Bilirubin, Direct: 0.1 mg/dL (ref <=0.2)
Indirect Bilirubin: 0.3 mg/dL (ref 0.2–1.2)
TOTAL PROTEIN: 7.2 g/dL (ref 6.1–8.1)
Total Bilirubin: 0.4 mg/dL (ref 0.2–1.2)

## 2016-11-17 LAB — TSH: TSH: 2.36 m[IU]/L

## 2016-11-17 LAB — MAGNESIUM: MAGNESIUM: 2 mg/dL (ref 1.5–2.5)

## 2016-11-23 DIAGNOSIS — H5213 Myopia, bilateral: Secondary | ICD-10-CM | POA: Diagnosis not present

## 2017-01-11 ENCOUNTER — Other Ambulatory Visit: Payer: Self-pay | Admitting: *Deleted

## 2017-01-11 MED ORDER — SYNTHROID 112 MCG PO TABS: ORAL_TABLET | ORAL | 1 refills | Status: DC

## 2017-01-11 MED FILL — SYNTHROID 112 MCG TABLET: 112 | 90 days supply | Qty: 90 | Fill #0

## 2017-01-31 DIAGNOSIS — C6932 Malignant neoplasm of left choroid: Secondary | ICD-10-CM | POA: Diagnosis not present

## 2017-04-12 MED FILL — SYNTHROID 112 MCG TABLET: 112 | 90 days supply | Qty: 90 | Fill #1

## 2017-05-22 NOTE — Progress Notes (Signed)
Complete Physical  Assessment and Plan: Hypothyroidism, unspecified hypothyroidism type Hypothyroidism-check TSH level, continue medications the same, reminded to take on an empty stomach 30-40mins before food.  - TSH  Malignant neoplasm of choroid, left Will send labs to Dr. Rito Ehrlich, [place] 2014 - Hepatic function panel - DG Chest 2 View; Future - Gamma GT - CT Abdomen Pelvis W Contrast; Future  Vitamin D deficiency - Vit D  25 hydroxy (rtn osteoporosis monitoring)   Hyperlipidemia - Lipid panel - Urinalysis, Routine w reflex microscopic (not at [facility]) - Microalbumin / creatinine urine ratio   Medication management - CBC with Differential/Platelet - BASIC METABOLIC PANEL WITH GFR - Magnesium  Anemia, unspecified anemia type - monitor, continue iron supp with Vitamin C and increase green leafy veggies   B12 deficiency - Vitamin B12   Corticosteroid-induced glaucoma, left, stage unspecified resolved  Psoriasis No rash at this time, monitor   Arthropathia Monitor, controlled for now, if worse may need to send RA  Routine general medical examination at a health care facility   Discussed med's effects and SE's. Screening labs and tests as requested with regular follow-up as recommended.  HPI 46 y.o. female  presents for a complete physical. Her blood pressure has been controlled at home, today their BP is BP: 126/88 She does not workout. She denies chest pain, shortness of breath, dizziness.  She is not on cholesterol medication and denies myalgias. Her cholesterol is at goal. The cholesterol last visit was:  Lab Results  Component Value Date   CHOL 206 (H) 11/16/2016   HDL 47 (L) 11/16/2016   LDLCALC 132 (H) 11/16/2016   TRIG 136 11/16/2016   CHOLHDL 4.4 11/16/2016    Last A1C in the office was:  Lab Results  Component Value Date   HGBA1C 5.2 05/09/2016   Patient is on Vitamin D supplement, but very bad about taking it.  Lab Results  Component Value  Date   VD25OH 28 (L) 05/09/2016   She has a B12 but does not take it regularly, takes sublingual once every 2 weeks.  Lab Results  Component Value Date   VITAMINB12 300 05/09/2016   She is on thyroid medication. Her medication was not changed last visit.   Lab Results  Component Value Date   TSH 2.36 11/16/2016  .  She was diagnosed with ocular melanoma, left eye in 2014 and has been seeing a specialist at [facility], Dr. Rito Ehrlich but now following with Dr. Daralene Milch, she had radiation and subsequent glaucoma BUT OFF THE EYE DROPS NOW, following with Dr. Celene Squibb.  She gets METS surveillance yearly and is due for CT AB with contrast (08/16) , LFTs/GTT q 6 months and CXR (04/2015).  DX code 190.6 Had Pap with Dr. Marvel Plan.  She is also seeing Dr. Martinique every 8 months for skin checks. BMI is Body mass index is 31.89 kg/m., she is working on diet and exercise. Wt Readings from Last 3 Encounters:  05/23/17 180 lb (81.6 kg)  11/16/16 181 lb 6.4 oz (82.3 kg)  05/09/16 179 lb 3.2 oz (81.3 kg)      Current Medications:  Current Outpatient Prescriptions on File Prior to Visit  Medication Sig Dispense Refill  . carboxymethylcellulose (REFRESH PLUS) 0.5 % SOLN Apply to eye.    . cholecalciferol (VITAMIN D) 1000 UNITS tablet Take 2,000 Units by mouth daily.    . Cyanocobalamin (VITAMIN B-12 PO) Take 1 tablet by mouth daily.    . ferrous sulfate 325 (65 FE) MG  tablet Take by mouth.    . fexofenadine (ALLEGRA) 180 MG tablet Take 180 mg by mouth daily.    Marland Kitchen MELATONIN PO Take 1 tablet by mouth at bedtime.    Marland Kitchen SYNTHROID 112 MCG tablet TAKE 1 TABLET BY MOUTH DAILY BEFORE BREAKFAST. 90 tablet 1   No current facility-administered medications on file prior to visit.    Health Maintenance:   Immunization History  Administered Date(s) Administered  . Tdap 08/12/2014   TDAP: 2015 Pneumovax: N/A Prevnar 13: N/A Flu vaccine: 2014 Zostavax: N/A Pap: 2016 q 5 years MGM: 12/2014 gets at Dr.  Marvel Plan DEXA: N/A Colonoscopy: EGD:  Medical History:  Past Medical History:  Diagnosis Date  . Arthritis    hands and feet  . Cancer ([place])    ocular melanoma  . Hypothyroidism   . PONV (postoperative nausea and vomiting)    past surgery Scop patch and propofol helped  . Thyroid disease    Allergies Allergies  Allergen Reactions  . Oxycodone Nausea And Vomiting    Can take morphine and possibly Vicodin   SURGICAL HISTORY She  has a past surgical history that includes Wisdom tooth extraction; Eye surgery; and laparoscopy (N/A, 09/13/2013). FAMILY HISTORY Her family history includes Arthritis in her mother; Cancer in her maternal grandmother and mother; Heart disease in her maternal grandfather; Hyperlipidemia in her father; Nephrolithiasis in her father; Varicose Veins in her mother. SOCIAL HISTORY She  reports that she has never smoked. She has never used smokeless tobacco. She reports that she drinks alcohol. She reports that she does not use drugs.  Review of Systems  Constitutional: Negative.   HENT: Positive for congestion (allergies, on allegra). Negative for ear discharge, ear pain, hearing loss, nosebleeds, sore throat and tinnitus.   Eyes: Positive for blurred vision. Negative for double vision, photophobia, pain, discharge and redness.  Respiratory: Negative.  Negative for stridor.   Cardiovascular: Negative.   Gastrointestinal: Negative.   Genitourinary: Negative.   Musculoskeletal: Positive for joint pain (hands and feet occ). Negative for back pain, falls, myalgias and neck pain.  Skin: Negative.  Negative for itching and rash.  Neurological: Negative.  Negative for headaches.  Endo/Heme/Allergies: Negative.   Psychiatric/Behavioral: Negative.     Physical Exam: Estimated body mass index is 31.89 kg/m as calculated from the following:   Height as of this encounter: 5\' 3"  (1.6 m).   Weight as of this encounter: 180 lb (81.6 kg). BP 126/88   Pulse 88    Temp (!) 97.3 F (36.3 C)   Resp 16   Ht 5\' 3"  (1.6 m)   Wt 180 lb (81.6 kg)   LMP 05/16/2017   SpO2 98%   BMI 31.89 kg/m  General Appearance: Well nourished, in no apparent distress. Eyes: PERRLA, EOMs, left eye ptosis improved, conjunctiva no swelling or erythema Sinuses: No Frontal/maxillary tenderness ENT/Mouth: Ext aud canals clear, normal light reflex with TMs without erythema, bulging.  Good dentition. No erythema, swelling, or exudate on post pharynx. Tonsils not swollen or erythematous. Hearing normal.  Neck: Supple, thyroid normal. No bruits Respiratory: Respiratory effort normal, BS equal bilaterally without rales, rhonchi, wheezing or stridor. Cardio: RRR without murmurs, rubs or gallops. Brisk peripheral pulses without edema.  Chest: symmetric, with normal excursions and percussion. Breasts: defer Abdomen: Soft, +BS. Non tender, no guarding, rebound, hernias, masses, or organomegaly.  Lymphatics: Non tender without lymphadenopathy.  Genitourinary: defer Musculoskeletal: Full ROM all peripheral extremities,5/5 strength, no deformities, joint swelling, redness hands and feet,  and normal gait. Skin:  Warm, dry without rashes, lesions, ecchymosis.  Neuro: Cranial nerves intact, reflexes equal bilaterally. Normal muscle tone, no cerebellar symptoms. Sensation intact.  Psych: Awake and oriented X 3, normal affect, Insight and Judgment appropriate.   EKG: declines   Vicie Mutters 3:11 PM

## 2017-05-23 ENCOUNTER — Encounter: Payer: Self-pay | Admitting: Physician Assistant

## 2017-05-23 ENCOUNTER — Ambulatory Visit (INDEPENDENT_AMBULATORY_CARE_PROVIDER_SITE_OTHER): Payer: 59 | Admitting: Physician Assistant

## 2017-05-23 VITALS — BP 126/88 | HR 88 | Temp 97.3°F | Resp 16 | Ht 63.0 in | Wt 180.0 lb

## 2017-05-23 DIAGNOSIS — E890 Postprocedural hypothyroidism: Secondary | ICD-10-CM

## 2017-05-23 DIAGNOSIS — E538 Deficiency of other specified B group vitamins: Secondary | ICD-10-CM

## 2017-05-23 DIAGNOSIS — H4060X0 Glaucoma secondary to drugs, unspecified eye, stage unspecified: Secondary | ICD-10-CM

## 2017-05-23 DIAGNOSIS — Z0001 Encounter for general adult medical examination with abnormal findings: Secondary | ICD-10-CM

## 2017-05-23 DIAGNOSIS — Z131 Encounter for screening for diabetes mellitus: Secondary | ICD-10-CM

## 2017-05-23 DIAGNOSIS — Z0000 Encounter for general adult medical examination without abnormal findings: Secondary | ICD-10-CM | POA: Diagnosis not present

## 2017-05-23 DIAGNOSIS — Z1389 Encounter for screening for other disorder: Secondary | ICD-10-CM

## 2017-05-23 DIAGNOSIS — E559 Vitamin D deficiency, unspecified: Secondary | ICD-10-CM

## 2017-05-23 DIAGNOSIS — E785 Hyperlipidemia, unspecified: Secondary | ICD-10-CM

## 2017-05-23 DIAGNOSIS — C6932 Malignant neoplasm of left choroid: Secondary | ICD-10-CM | POA: Diagnosis not present

## 2017-05-23 DIAGNOSIS — D649 Anemia, unspecified: Secondary | ICD-10-CM

## 2017-05-23 DIAGNOSIS — M129 Arthropathy, unspecified: Secondary | ICD-10-CM

## 2017-05-23 DIAGNOSIS — T380X5A Adverse effect of glucocorticoids and synthetic analogues, initial encounter: Secondary | ICD-10-CM

## 2017-05-23 DIAGNOSIS — L409 Psoriasis, unspecified: Secondary | ICD-10-CM

## 2017-05-23 DIAGNOSIS — Z79899 Other long term (current) drug therapy: Secondary | ICD-10-CM

## 2017-05-23 NOTE — Patient Instructions (Addendum)
Vitamin D goal is between 60-80  Please make sure that you are taking your Vitamin D as directed.   It is very important as a natural anti-inflammatory   helping hair, skin, and nails, as well as reducing stroke and heart attack risk.   It helps your bones and helps with mood.  We want you on at least 5000 IU daily  It also decreases numerous cancer risks so please take it as directed.   Low Vit D is associated with a 200-300% higher risk for CANCER   and 200-300% higher risk for HEART   ATTACK  &  STROKE.    .....................................Marland Kitchen  It is also associated with higher death rate at younger ages,   autoimmune diseases like Rheumatoid arthritis, Lupus, Multiple Sclerosis.     Also many other serious conditions, like depression, Alzheimer's  Dementia, infertility, muscle aches, fatigue, fibromyalgia - just to name a few.  +++++++++++++++++++  Can get liquid vitamin D from [facility] here in [place] at  [facility] alternatives [address], [place], [place] [address] Or you can try earth fare    Vitamin B12 Deficiency Vitamin B12 deficiency means that your body is not getting enough vitamin B12. Your body needs vitamin B12 for important bodily functions. If you do not have enough vitamin B12 in your body, you can have health problems. Follow these instructions at home:  Take supplements only as told by your doctor. Follow the directions carefully.  Get any shots (injections) as told by your doctor. Do not miss your visits to the doctor.  Eat lots of healthy foods that contain vitamin B12. Ask your doctor if you should work with someone who is trained in how food affects health (dietitian). Foods that contain vitamin B12 include: ? Meat. ? Meat from birds (poultry). ? Fish. ? Eggs. ? Cereal and dairy products that are fortified. This means that vitamin B12 has been added to the food. Check the label on the package to see if the food is fortified.  Do not  drink too much (do not abuse) alcohol.  Keep all follow-up visits as told by your doctor. This is important. Contact a doctor if:  Your symptoms come back. Get help right away if:  You have trouble breathing.  You have chest pain.  You get dizzy.  You pass out (lose consciousness). This information is not intended to replace advice given to you by your health care provider. Make sure you discuss any questions you have with your health care provider. Document Released: 09/01/2011 Document Revised: 02/18/2016 Document Reviewed: 01/28/2015 Elsevier Interactive Patient Education  2018 [employer].   Dyslipidemia Dyslipidemia is an imbalance of waxy, fat-like substances (lipids) in the blood. The body needs lipids in small amounts. Dyslipidemia often involves a high level of cholesterol or triglycerides, which are types of lipids. Common forms of dyslipidemia include: High levels of bad cholesterol (LDL cholesterol). LDL is the type of cholesterol that causes fatty deposits (plaques) to build up in the blood vessels that carry blood away from your heart (arteries). Low levels of good cholesterol (HDL cholesterol). HDL cholesterol is the type of cholesterol that protects against heart disease. High levels of HDL remove the LDL buildup from arteries. High levels of triglycerides. Triglycerides are a fatty substance in the blood that is linked to a buildup of plaques in the arteries.  You can develop dyslipidemia because of the genes you are born with (primary dyslipidemia) or changes that occur during your life (secondary  dyslipidemia), or as a side effect of certain medical treatments. What are the causes? Primary dyslipidemia is caused by changes (mutations) in genes that are passed down through families (inherited). These mutations cause several types of dyslipidemia. Mutations can result in disorders that make the body produce too much LDL cholesterol or triglycerides, or not enough HDL  cholesterol. These disorders may lead to heart disease, arterial disease, or stroke at an early age. Causes of secondary dyslipidemia include certain lifestyle choices and diseases that lead to dyslipidemia, such as: Eating a diet that is high in animal fat. Not getting enough activity or exercise (having a sedentary lifestyle). Having diabetes, kidney disease, liver disease, or thyroid disease. Drinking large amounts of alcohol. Using certain types of drugs.  What increases the risk? You may be at greater risk for dyslipidemia if you are an older man or if you are a woman who has gone through menopause. Other risk factors include: Having a family history of dyslipidemia. Taking certain medicines, including birth control pills, steroids, some diuretics, beta-blockers, and some medicines forHIV. Smoking cigarettes. Eating a high-fat diet. Drinking large amounts of alcohol. Having certain medical conditions such as diabetes, polycystic ovary syndrome (PCOS), pregnancy, kidney disease, liver disease, or hypothyroidism. Not exercising regularly. Being overweight or obese with too much belly fat.  What are the signs or symptoms? Dyslipidemia does not usually cause any symptoms. Very high lipid levels can cause fatty bumps under the skin (xanthomas) or a white or gray ring around the black center (pupil) of the eye. Very high triglyceride levels can cause inflammation of the pancreas (pancreatitis). How is this diagnosed? Your health care provider may diagnose dyslipidemia based on a routine blood test (fasting blood test). Because most people do not have symptoms of the condition, this blood testing (lipid profile) is done on adults age 75 and older and is repeated every 5 years. This test checks: Total cholesterol. This is a measure of the total amount of cholesterol in your blood, including LDL cholesterol, HDL cholesterol, and triglycerides. A healthy number is below 200. LDL cholesterol. The  target number for LDL cholesterol is different for each person, depending on individual risk factors. For most people, a number below 100 is healthy. Ask your health care provider what your LDL cholesterol number should be. HDL cholesterol. An HDL level of 60 or higher is best because it helps to protect against heart disease. A number below 17 for men or below 13 for women increases the risk for heart disease. Triglycerides. A healthy triglyceride number is below 150.  If your lipid profile is abnormal, your health care provider may do other blood tests to get more information about your condition. How is this treated? Treatment depends on the type of dyslipidemia that you have and your other risk factors for heart disease and stroke. Your health care provider will have a target range for your lipid levels based on this information. For many people, treatment starts with lifestyle changes, such as diet and exercise. Your health care provider may recommend that you: Get regular exercise. Make changes to your diet. Quit smoking if you smoke.  If diet changes and exercise do not help you reach your goals, your health care provider may also prescribe medicine to lower lipids. The most commonly prescribed type of medicine lowers your LDL cholesterol (statin drug). If you have a high triglyceride level, your provider may prescribe another type of drug (fibrate) or an omega-3 fish oil supplement, or both. Follow these  instructions at home: Take over-the-counter and prescription medicines only as told by your health care provider. This includes supplements. Get regular exercise. Start an aerobic exercise and strength training program as told by your health care provider. Ask your health care provider what activities are safe for you. Your health care provider may recommend: 30 minutes of aerobic activity 4-6 days a week. Brisk walking is an example of aerobic activity. Strength training 2 days a week. Eat  a healthy diet as told by your health care provider. This can help you reach and maintain a healthy weight, lower your LDL cholesterol, and raise your HDL cholesterol. It may help to work with a diet and nutrition specialist (dietitian) to make a plan that is right for you. Your dietitian or health care provider may recommend: Limiting your calories, if you are overweight. Eating more fruits, vegetables, whole grains, fish, and lean meats. Limiting saturated fat, trans fat, and cholesterol. Follow instructions from your health care provider or dietitian about eating or drinking restrictions. Limit alcohol intake to no more than one drink per day for nonpregnant women and two drinks per day for men. One drink equals 12 oz of beer, 5 oz of wine, or 1 oz of hard liquor. Do not use any products that contain nicotine or tobacco, such as cigarettes and e-cigarettes. If you need help quitting, ask your health care provider. Keep all follow-up visits as told by your health care provider. This is important. Contact a health care provider if: You are having trouble sticking to your exercise or diet plan. You are struggling to quit smoking or control your use of alcohol. Summary Dyslipidemia is an imbalance of waxy, fat-like substances (lipids) in the blood. The body needs lipids in small amounts. Dyslipidemia often involves a high level of cholesterol or triglycerides, which are types of lipids. Treatment depends on the type of dyslipidemia that you have and your other risk factors for heart disease and stroke. For many people, treatment starts with lifestyle changes, such as diet and exercise. Your health care provider may also prescribe medicine to lower lipids. This information is not intended to replace advice given to you by your health care provider. Make sure you discuss any questions you have with your health care provider. Document Released: 09/17/2013 Document Revised: 05/09/2016 Document Reviewed:  05/09/2016 Elsevier Interactive Patient Education  [employer].

## 2017-05-25 LAB — HEPATIC FUNCTION PANEL
AG Ratio: 1.7 (calc) (ref 1.0–2.5)
ALBUMIN MSPROF: 4.3 g/dL (ref 3.6–5.1)
ALT: 12 U/L (ref 6–29)
AST: 15 U/L (ref 10–30)
Alkaline phosphatase (APISO): 92 U/L (ref 33–115)
BILIRUBIN DIRECT: 0.1 mg/dL (ref 0.0–0.2)
BILIRUBIN INDIRECT: 0.2 mg/dL (ref 0.2–1.2)
GLOBULIN: 2.6 g/dL (ref 1.9–3.7)
Total Bilirubin: 0.3 mg/dL (ref 0.2–1.2)
Total Protein: 6.9 g/dL (ref 6.1–8.1)

## 2017-05-25 LAB — URINALYSIS, ROUTINE W REFLEX MICROSCOPIC
Bilirubin Urine: NEGATIVE
Glucose, UA: NEGATIVE
Hgb urine dipstick: NEGATIVE
Ketones, ur: NEGATIVE
LEUKOCYTES UA: NEGATIVE
Nitrite: NEGATIVE
PH: 7.5 (ref 5.0–8.0)
Protein, ur: NEGATIVE
SPECIFIC GRAVITY, URINE: 1.019 (ref 1.001–1.03)

## 2017-05-25 LAB — LIPID PANEL
Cholesterol: 200 mg/dL — ABNORMAL HIGH (ref <200)
HDL: 44 mg/dL — ABNORMAL LOW (ref >50)
LDL CHOLESTEROL (CALC): 128 mg/dL — AB
NON-HDL CHOLESTEROL (CALC): 156 mg/dL — AB (ref <130)
TRIGLYCERIDES: 160 mg/dL — AB (ref <150)
Total CHOL/HDL Ratio: 4.5 (calc) (ref <5.0)

## 2017-05-25 LAB — CBC WITH DIFFERENTIAL/PLATELET
BASOS ABS: 31 {cells}/uL (ref 0–200)
Basophils Relative: 0.5 %
Eosinophils Absolute: 43 cells/uL (ref 15–500)
Eosinophils Relative: 0.7 %
HEMATOCRIT: 37.3 % (ref 35.0–45.0)
Hemoglobin: 12.4 g/dL (ref 11.7–15.5)
LYMPHS ABS: 1958 {cells}/uL (ref 850–3900)
MCH: 28.2 pg (ref 27.0–33.0)
MCHC: 33.2 g/dL (ref 32.0–36.0)
MCV: 84.8 fL (ref 80.0–100.0)
MPV: 9.8 fL (ref 7.5–12.5)
Monocytes Relative: 5.2 %
NEUTROS PCT: 61.5 %
Neutro Abs: 3752 cells/uL (ref 1500–7800)
PLATELETS: 288 10*3/uL (ref 140–400)
RBC: 4.4 10*6/uL (ref 3.80–5.10)
RDW: 12.9 % (ref 11.0–15.0)
TOTAL LYMPHOCYTE: 32.1 %
WBC: 6.1 10*3/uL (ref 3.8–10.8)
WBCMIX: 317 {cells}/uL (ref 200–950)

## 2017-05-25 LAB — TSH: TSH: 1.57 mIU/L

## 2017-05-25 LAB — BASIC METABOLIC PANEL WITH GFR
BUN: 11 mg/dL (ref 7–25)
CO2: 24 mmol/L (ref 20–32)
Calcium: 9.4 mg/dL (ref 8.6–10.2)
Chloride: 105 mmol/L (ref 98–110)
Creat: 0.67 mg/dL (ref 0.50–1.10)
GFR, EST AFRICAN AMERICAN: 124 mL/min/{1.73_m2} (ref >=60)
GFR, EST NON AFRICAN AMERICAN: 107 mL/min/{1.73_m2} (ref >=60)
Glucose, Bld: 80 mg/dL (ref 65–99)
POTASSIUM: 4.1 mmol/L (ref 3.5–5.3)
Sodium: 139 mmol/L (ref 135–146)

## 2017-05-25 LAB — VITAMIN D 25 HYDROXY (VIT D DEFICIENCY, FRACTURES): Vit D, 25-Hydroxy: 24 ng/mL — ABNORMAL LOW (ref 30–100)

## 2017-05-25 LAB — IRON, TOTAL/TOTAL IRON BINDING CAP
%SAT: 22 % (ref 11–50)
IRON: 82 ug/dL (ref 40–190)
TIBC: 370 ug/dL (ref 250–450)

## 2017-05-25 LAB — GAMMA GT: GGT: 14 U/L (ref 3–55)

## 2017-05-25 LAB — MICROALBUMIN / CREATININE URINE RATIO
Creatinine, Urine: 112 mg/dL (ref 20–320)
MICROALB UR: 0.3 mg/dL
MICROALB/CREAT RATIO: 3 ug/mg{creat} (ref <30)

## 2017-05-25 LAB — HEMOGLOBIN A1C
HEMOGLOBIN A1C: 5.2 %{Hb} (ref <5.7)
Mean Plasma Glucose: 103 (calc)
eAG (mmol/L): 5.7 (calc)

## 2017-05-25 LAB — VITAMIN B12: Vitamin B-12: 387 pg/mL (ref 200–1100)

## 2017-05-25 LAB — MAGNESIUM: Magnesium: 2.1 mg/dL (ref 1.5–2.5)

## 2017-05-31 ENCOUNTER — Ambulatory Visit
Admission: RE | Admit: 2017-05-31 | Discharge: 2017-05-31 | Disposition: A | Discharge status: Home / Self Care | Payer: 59 | Source: Ambulatory Visit | Attending: Physician Assistant | Admitting: Physician Assistant

## 2017-05-31 DIAGNOSIS — D259 Leiomyoma of uterus, unspecified: Secondary | ICD-10-CM | POA: Diagnosis not present

## 2017-05-31 DIAGNOSIS — C6932 Malignant neoplasm of left choroid: Secondary | ICD-10-CM

## 2017-05-31 MED ORDER — IOHEXOL 300 MG/ML  SOLN
30.0000 mL | Freq: Once | INTRAMUSCULAR | Status: AC | PRN
  Administered 2017-05-31: 30 mL via ORAL

## 2017-05-31 MED ORDER — IOPAMIDOL (ISOVUE-300) INJECTION 61%
100.0000 mL | Freq: Once | INTRAVENOUS | Status: AC | PRN
  Administered 2017-05-31: 100 mL via INTRAVENOUS

## 2017-06-01 ENCOUNTER — Telehealth: Payer: Self-pay | Admitting: Physician Assistant

## 2017-06-01 ENCOUNTER — Ambulatory Visit (HOSPITAL_COMMUNITY)
Admission: RE | Admit: 2017-06-01 | Discharge: 2017-06-01 | Disposition: A | Discharge status: Home / Self Care | Payer: 59 | Source: Ambulatory Visit | Attending: Physician Assistant | Admitting: Physician Assistant

## 2017-06-01 DIAGNOSIS — C6932 Malignant neoplasm of left choroid: Secondary | ICD-10-CM | POA: Insufficient documentation

## 2017-06-01 NOTE — Telephone Encounter (Signed)
Per Vicie Mutters, faxed most recent office note, labs, cxr, and CT to Dr Luberta Mutter, fax- [phone] and Dr Eugenio Hoes, [facility], fax [phone].

## 2017-07-04 DIAGNOSIS — I788 Other diseases of capillaries: Secondary | ICD-10-CM | POA: Diagnosis not present

## 2017-07-04 DIAGNOSIS — D2372 Other benign neoplasm of skin of left lower limb, including hip: Secondary | ICD-10-CM | POA: Diagnosis not present

## 2017-07-04 DIAGNOSIS — D225 Melanocytic nevi of trunk: Secondary | ICD-10-CM | POA: Diagnosis not present

## 2017-07-04 DIAGNOSIS — L821 Other seborrheic keratosis: Secondary | ICD-10-CM | POA: Diagnosis not present

## 2017-07-04 DIAGNOSIS — D2272 Melanocytic nevi of left lower limb, including hip: Secondary | ICD-10-CM | POA: Diagnosis not present

## 2017-07-04 DIAGNOSIS — D2262 Melanocytic nevi of left upper limb, including shoulder: Secondary | ICD-10-CM | POA: Diagnosis not present

## 2017-07-04 DIAGNOSIS — D2261 Melanocytic nevi of right upper limb, including shoulder: Secondary | ICD-10-CM | POA: Diagnosis not present

## 2017-07-04 DIAGNOSIS — L84 Corns and callosities: Secondary | ICD-10-CM | POA: Diagnosis not present

## 2017-07-06 ENCOUNTER — Other Ambulatory Visit: Payer: Self-pay | Admitting: Physician Assistant

## 2017-07-06 MED FILL — SYNTHROID 112 MCG TABLET: 112 | 90 days supply | Qty: 90 | Fill #0

## 2017-08-01 DIAGNOSIS — C6932 Malignant neoplasm of left choroid: Secondary | ICD-10-CM | POA: Diagnosis not present

## 2017-10-05 MED FILL — SYNTHROID 112 MCG TABLET: 112 | 90 days supply | Qty: 90 | Fill #1

## 2017-11-20 NOTE — Progress Notes (Signed)
Assessment and Plan:   Hypothyroidism following radioiodine therapy Want between 1-2 Hypothyroidism-check TSH level, continue medications the same, reminded to take on an empty stomach 30-55mins before food.  -     TSH  Malignant neoplasm of left choroid (HCC) Still following up with [facility]  Hyperlipidemia, unspecified hyperlipidemia type -     Lipid panel  Medication management -     CBC with Differential/Platelet -     BASIC METABOLIC PANEL WITH GFR -     Hepatic function panel    Continue diet and meds as discussed. Further disposition pending results of labs. Future Appointments  Date Time Provider [place]  06/13/2018  3:00 PM Vicie Mutters, PA-C [id number] None   HPI 47 y.o. female  presents for 3 month follow up with hypertension, hyperlipidemia, prediabetes and vitamin D. Her blood pressure has been controlled at home, today their BP is BP: 124/80 She does not workout. She denies chest pain, shortness of breath, dizziness.  She is not on cholesterol medication and denies myalgias. Her cholesterol is at goal. The cholesterol last visit was:   Lab Results  Component Value Date   CHOL 200 (H) 05/23/2017   HDL 44 (L) 05/23/2017   LDLCALC 132 (H) 11/16/2016   TRIG 160 (H) 05/23/2017   CHOLHDL 4.5 05/23/2017   She has been working on diet and exercise for prediabetes, and denies paresthesia of the feet, polydipsia, polyuria and visual disturbances. Last A1C in the office was:  Lab Results  Component Value Date   HGBA1C 5.2 05/23/2017   Patient is on Vitamin D supplement.   Lab Results  Component Value Date   VD25OH 24 (L) 05/23/2017     She is on thyroid medication 112 mcg QD. Her medication was not changed last visit. Patient denies denies fatigue, weight changes, heat/cold intolerance, bowel/skin changes or CVS symptoms  Lab Results  Component Value Date   TSH 1.57 05/23/2017   Has low B12, does not take it consistently Lab Results  Component Value  Date   [id number] 05/23/2017   She has ocular melanoma of left eye x 2014, folllows Dr. Rito Ehrlich at [facility], getting cataract surgery, q 6 months.  BMI is Body mass index is 32.24 kg/m., she is working on diet and exercise. Wt Readings from Last 3 Encounters:  11/21/17 182 lb (82.6 kg)  05/23/17 180 lb (81.6 kg)  11/16/16 181 lb 6.4 oz (82.3 kg)    Current Medications:  Current Outpatient Medications on File Prior to Visit  Medication Sig Dispense Refill  . carboxymethylcellulose (REFRESH PLUS) 0.5 % SOLN Apply to eye.    . cholecalciferol (VITAMIN D) 1000 UNITS tablet Take 2,000 Units by mouth daily.    . Cyanocobalamin (VITAMIN B-12 PO) Take 1 tablet by mouth daily.    . fexofenadine (ALLEGRA) 180 MG tablet Take 180 mg by mouth daily.    Marland Kitchen MELATONIN PO Take 1 tablet by mouth at bedtime.    Marland Kitchen SYNTHROID 112 MCG tablet TAKE 1 TABLET BY MOUTH DAILY BEFORE BREAKFAST. 90 tablet 1   No current facility-administered medications on file prior to visit.    Medical History:  Past Medical History:  Diagnosis Date  . Arthritis    hands and feet  . Cancer ([place])    ocular melanoma  . Hypothyroidism   . PONV (postoperative nausea and vomiting)    past surgery Scop patch and propofol helped  . Thyroid disease    Allergies:  Allergies  Allergen Reactions  .  Oxycodone Nausea And Vomiting    Can take morphine and possibly Vicodin   Review of Systems  Constitutional: Negative.   HENT: Negative for congestion, ear discharge, ear pain, hearing loss, nosebleeds, sore throat and tinnitus.   Eyes: Positive for blurred vision. Negative for double vision, photophobia, pain, discharge and redness.  Respiratory: Negative.  Negative for stridor.   Cardiovascular: Negative.   Gastrointestinal: Negative.   Genitourinary: Negative.   Musculoskeletal: Positive for joint pain (hands and feet occ). Negative for back pain, falls, myalgias and neck pain.  Skin: Negative.  Negative for itching and  rash.  Neurological: Negative.  Negative for headaches.  Endo/Heme/Allergies: Negative.   Psychiatric/Behavioral: Negative.     Family history- Review and unchanged Social history- Review and unchanged Physical Exam: BP 124/80   Pulse 83   Temp 98.1 F (36.7 C) (Temporal)   Resp 14   Ht 5\' 3"  (1.6 m)   Wt 182 lb (82.6 kg)   SpO2 98%   BMI 32.24 kg/m  Wt Readings from Last 3 Encounters:  11/21/17 182 lb (82.6 kg)  05/23/17 180 lb (81.6 kg)  11/16/16 181 lb 6.4 oz (82.3 kg)   General Appearance: Well nourished, in no apparent distress. Eyes: PERRLA, EOMs, conjunctiva no swelling or erythema Sinuses: No Frontal/maxillary tenderness ENT/Mouth: Ext aud canals clear, TMs without erythema, bulging. No erythema, swelling, or exudate on post pharynx.  Tonsils not swollen or erythematous. Hearing normal.  Neck: Supple, thyroid normal.  Respiratory: Respiratory effort normal, BS equal bilaterally without rales, rhonchi, wheezing or stridor.  Cardio: RRR with no MRGs. Brisk peripheral pulses without edema.  Abdomen: Soft, + BS.  Non tender, no guarding, rebound, hernias, masses. Lymphatics: Non tender without lymphadenopathy.  Musculoskeletal: Full ROM, 5/5 strength, normal gait.  Skin: Warm, dry without rashes, lesions, ecchymosis.  Neuro: Cranial nerves intact. Normal muscle tone, no cerebellar symptoms. Sensation intact.  Psych: Awake and oriented X 3, normal affect, Insight and Judgment appropriate.    Vicie Mutters, PA-C 2:45 PM [facility]

## 2017-11-21 ENCOUNTER — Ambulatory Visit: Payer: 59 | Admitting: Physician Assistant

## 2017-11-21 ENCOUNTER — Encounter: Payer: Self-pay | Admitting: Physician Assistant

## 2017-11-21 VITALS — BP 124/80 | HR 83 | Temp 98.1°F | Resp 14 | Ht 63.0 in | Wt 182.0 lb

## 2017-11-21 DIAGNOSIS — Z79899 Other long term (current) drug therapy: Secondary | ICD-10-CM | POA: Diagnosis not present

## 2017-11-21 DIAGNOSIS — C6932 Malignant neoplasm of left choroid: Secondary | ICD-10-CM

## 2017-11-21 DIAGNOSIS — I700 Atherosclerosis of aorta: Secondary | ICD-10-CM

## 2017-11-21 DIAGNOSIS — E890 Postprocedural hypothyroidism: Secondary | ICD-10-CM | POA: Diagnosis not present

## 2017-11-21 DIAGNOSIS — D649 Anemia, unspecified: Secondary | ICD-10-CM | POA: Diagnosis not present

## 2017-11-21 DIAGNOSIS — E559 Vitamin D deficiency, unspecified: Secondary | ICD-10-CM | POA: Diagnosis not present

## 2017-11-21 DIAGNOSIS — E785 Hyperlipidemia, unspecified: Secondary | ICD-10-CM

## 2017-11-21 NOTE — Patient Instructions (Addendum)
Vitamin D goal is between 60-80  Please make sure that you are taking your Vitamin D as directed.   It is very important as a natural anti-inflammatory   helping hair, skin, and nails, as well as reducing stroke and heart attack risk.   It helps your bones and helps with mood.  We want you on at least 5000 IU daily  It also decreases numerous cancer risks so please take it as directed.   Low Vit D is associated with a 200-300% higher risk for CANCER   and 200-300% higher risk for HEART   ATTACK  &  STROKE.    .....................................Marland Kitchen  It is also associated with higher death rate at younger ages,   autoimmune diseases like Rheumatoid arthritis, Lupus, Multiple Sclerosis.     Also many other serious conditions, like depression, Alzheimer's  Dementia, infertility, muscle aches, fatigue, fibromyalgia - just to name a few.  +++++++++++++++++++  Can get liquid vitamin D from [facility] here in [place] at  [facility] alternatives [address], [place], [place] [address] Or you can try earth fare  Benefiber or Citracel is good for constipation/diarrhea/irritable bowel syndrome, it helps with weight loss and can help lower your bad cholesterol. Please do 1 TBSP in the morning in water, coffee, or tea. It can take up to a month before you can see a difference with your bowel movements. It is cheapest from [employer], [employer], [employer].     Fat and Cholesterol Restricted Diet High levels of fat and cholesterol in your blood may lead to various health problems, such as diseases of the heart, blood vessels, gallbladder, liver, and pancreas. Fats are concentrated sources of energy that come in various forms. Certain types of fat, including saturated fat, may be harmful in excess. Cholesterol is a substance needed by your body in small amounts. Your body makes all the cholesterol it needs. Excess cholesterol comes from the food you eat. When you have high levels of cholesterol and  saturated fat in your blood, health problems can develop because the excess fat and cholesterol will gather along the walls of your blood vessels, causing them to narrow. Choosing the right foods will help you control your intake of fat and cholesterol. This will help keep the levels of these substances in your blood within normal limits and reduce your risk of disease.  What types of fat should I choose?  Choose healthy fats more often. Choose monounsaturated and polyunsaturated fats, such as olive and canola oil, flaxseeds, walnuts, almonds, and seeds.  Eat more omega-3 fats. Good choices include salmon, mackerel, sardines, tuna, flaxseed oil, and ground flaxseeds. Aim to eat fish at least two times a week.  Limit saturated fats. Saturated fats are primarily found in animal products, such as meats, butter, and cream. Plant sources of saturated fats include palm oil, palm kernel oil, and coconut oil.  Avoid foods with partially hydrogenated oils in them. These contain trans fats. Examples of foods that contain trans fats are stick margarine, some tub margarines, cookies, crackers, and other baked goods. What general guidelines do I need to follow? These guidelines for healthy eating will help you control your intake of fat and cholesterol:  Check food labels carefully to identify foods with trans fats or high amounts of saturated fat.  Fill one half of your plate with vegetables and green salads.  Fill one fourth of your plate with whole grains. Look for the word "whole" as the first word in the ingredient  list.  Fill one fourth of your plate with lean protein foods.  Limit fruit to two servings a day. Choose fruit instead of juice.  Eat more foods that contain fiber, such as apples, broccoli, carrots, beans, peas, and barley.  Eat more home-cooked food and less restaurant, buffet, and fast food.  Limit or avoid alcohol.  Limit foods high in starch and sugar.  Limit fried  foods.  Cook foods using methods other than frying. Baking, boiling, grilling, and broiling are all great options.  Lose weight if you are overweight. Losing just 5-10% of your initial body weight can help your overall health and prevent diseases such as diabetes and heart disease.  What foods can I eat? Grains  Whole grains, such as whole wheat or whole grain breads, crackers, cereals, and pasta. Unsweetened oatmeal, bulgur, barley, quinoa, or brown rice. Corn or whole wheat flour tortillas. Vegetables  Fresh or frozen vegetables (raw, steamed, roasted, or grilled). Green salads. Fruits  All fresh, canned (in natural juice), or frozen fruits. Meats and other protein foods  Ground beef (85% or leaner), grass-fed beef, or beef trimmed of fat. Skinless chicken or [place]. Ground chicken or [place]. Pork trimmed of fat. All fish and seafood. Eggs. Dried beans, peas, or lentils. Unsalted nuts or seeds. Unsalted canned or dry beans. Dairy  Low-fat dairy products, such as skim or 1% milk, 2% or reduced-fat cheeses, low-fat ricotta or cottage cheese, or plain low-fat yo Fats and oils  Tub margarines without trans fats. Light or reduced-fat mayonnaise and salad dressings. Avocado. Olive, canola, sesame, or safflower oils. Natural peanut or almond butter (choose ones without added sugar and oil). The items listed above may not be a complete list of recommended foods or beverages. Contact your dietitian for more options. Foods to avoid Grains  White bread. White pasta. White rice. Cornbread. Bagels, pastries, and croissants. Crackers that contain trans fat. Vegetables  White potatoes. Corn. Creamed or fried vegetables. Vegetables in a cheese sauce. Fruits  Dried fruits. Canned fruit in light or heavy syrup. Fruit juice. Meats and other protein foods  Fatty cuts of meat. Ribs, chicken wings, bacon, sausage, bologna, salami, chitterlings, fatback, hot dogs, bratwurst, and packaged luncheon  meats. Liver and organ meats. Dairy  Whole or 2% milk, cream, half-and-half, and cream cheese. Whole milk cheeses. Whole-fat or sweetened yogurt. Full-fat cheeses. Nondairy creamers and whipped toppings. Processed cheese, cheese spreads, or cheese curds. Beverages  Alcohol. Sweetened drinks (such as sodas, lemonade, and fruit drinks or punches). Fats and oils  Butter, stick margarine, lard, shortening, ghee, or bacon fat. Coconut, palm kernel, or palm oils. Sweets and desserts  Corn syrup, sugars, honey, and molasses. Candy. Jam and jelly. Syrup. Sweetened cereals. Cookies, pies, cakes, donuts, muffins, and ice cream. The items listed above may not be a complete list of foods and beverages to avoid. Contact your dietitian for more information. This information is not intended to replace advice given to you by your health care provider. Make sure you discuss any questions you have with your health care provider. Document Released: 09/12/2005 Document Revised: 10/03/2014 Document Reviewed: 12/11/2013 Elsevier Interactive Patient Education  2018 [employer].  Being dehydrated can hurt your kidneys, cause fatigue, headaches, muscle aches, joint pain, and dry skin/nails so please increase your fluids.   Drink 80-100 oz a day of water, measure it out! Eat 3 meals a day, have to do breakfast, eat protein- hard boiled eggs, protein bar like [place] protein bar, greek yogurt  like oikos triple zero, chobani 100, or light n fit greek  Can check out plantnanny app on your phone to help you keep track of your water  Veggies are great because you can eat a ton! They are low in calories, great to fill you up, and have a ton of vitamins, minerals, and protein.      Are you an emotional eater? Do you eat more when you're feeling stressed? Do you eat when you're not hungry or when you're full? Do you eat to feel better (to calm and soothe yourself when you're sad, mad, bored, anxious,  etc.)? Do you reward yourself with food? Do you regularly eat until you've stuffed yourself? Does food make you feel safe? Do you feel like food is a friend? Do you feel powerless or out of control around food?  If you answered yes to some of these questions than it is likely that you are an emotional eater. This is normally a learned behavior and can take time to first recognize the signs and second BREAK THE HABIT. But here is more information and tips to help.   The difference between emotional hunger and physical hunger Emotional hunger can be powerful, so it's easy to mistake it for physical hunger. But there are clues you can look for to help you tell physical and emotional hunger apart.  Emotional hunger comes on suddenly. It hits you in an instant and feels overwhelming and urgent. Physical hunger, on the other hand, comes on more gradually. The urge to eat doesn't feel as dire or demand instant satisfaction (unless you haven't eaten for a very long time).  Emotional hunger craves specific comfort foods. When you're physically hungry, almost anything sounds good-including healthy stuff like vegetables. But emotional hunger craves junk food or sugary snacks that provide an instant rush. You feel like you need cheesecake or pizza, and nothing else will do.  Emotional hunger often leads to mindless eating. Before you know it, you've eaten a whole bag of chips or an entire pint of ice cream without really paying attention or fully enjoying it. When you're eating in response to physical hunger, you're typically more aware of what you're doing.  Emotional hunger isn't satisfied once you're full. You keep wanting more and more, often eating until you're uncomfortably stuffed. Physical hunger, on the other hand, doesn't need to be stuffed. You feel satisfied when your stomach is full.  Emotional hunger isn't located in the stomach. Rather than a growling belly or a pang in your stomach, you feel  your hunger as a craving you can't get out of your head. You're focused on specific textures, tastes, and smells.  Emotional hunger often leads to regret, guilt, or shame. When you eat to satisfy physical hunger, you're unlikely to feel guilty or ashamed because you're simply giving your body what it needs. If you feel guilty after you eat, it's likely because you know deep down that you're not eating for nutritional reasons.  Identify your emotional eating triggers What situations, places, or feelings make you reach for the comfort of food? Most emotional eating is linked to unpleasant feelings, but it can also be triggered by positive emotions, such as rewarding yourself for achieving a goal or celebrating a holiday or happy event. Common causes of emotional eating include:  Stuffing emotions - Eating can be a way to temporarily silence or "stuff down" uncomfortable emotions, including anger, fear, sadness, anxiety, loneliness, resentment, and shame. While you're numbing yourself with  food, you can avoid the difficult emotions you'd rather not feel.  Boredom or feelings of emptiness - Do you ever eat simply to give yourself something to do, to relieve boredom, or as a way to fill a void in your life? You feel unfulfilled and empty, and food is a way to occupy your mouth and your time. In the moment, it fills you up and distracts you from underlying feelings of purposelessness and dissatisfaction with your life.  Childhood habits - Think back to your childhood memories of food. Did your parents reward good behavior with ice cream, take you out for pizza when you got a good report card, or serve you sweets when you were feeling sad? These habits can often carry over into adulthood. Or your eating may be driven by nostalgia-for cherished memories of grilling burgers in the backyard with your dad or baking and eating cookies with your mom.  Social influences - Getting together with other people for a meal  is a great way to relieve stress, but it can also lead to overeating. It's easy to overindulge simply because the food is there or because everyone else is eating. You may also overeat in social situations out of nervousness. Or perhaps your family or circle of friends encourages you to overeat, and it's easier to go along with the group.  Stress - Ever notice how stress makes you hungry? It's not just in your mind. When stress is chronic, as it so often is in our chaotic, fast-paced world, your body produces high levels of the stress hormone, cortisol. Cortisol triggers cravings for salty, sweet, and fried foods-foods that give you a burst of energy and pleasure. The more uncontrolled stress in your life, the more likely you are to turn to food for emotional relief.  Find other ways to feed your feelings If you don't know how to manage your emotions in a way that doesn't involve food, you won't be able to control your eating habits for very long. Diets so often fail because they offer logical nutritional advice which only works if you have conscious control over your eating habits. It doesn't work when emotions hijack the process, demanding an immediate payoff with food.  In order to stop emotional eating, you have to find other ways to fulfill yourself emotionally. It's not enough to understand the cycle of emotional eating or even to understand your triggers, although that's a huge first step. You need alternatives to food that you can turn to for emotional fulfillment.  Alternatives to emotional eating If you're depressed or lonely, call someone who always makes you feel better, play with your dog or cat, or look at a favorite photo or cherished memento.  If you're anxious, expend your nervous energy by dancing to your favorite song, squeezing a stress ball, or taking a brisk walk.  If you're exhausted, treat yourself with a hot cup of tea, take a bath, light some scented candles, or wrap yourself  in a warm blanket.  If you're bored, read a good book, watch a comedy show, explore the outdoors, or turn to an activity you enjoy (woodworking, playing the guitar, shooting hoops, scrapbooking, etc.).  What is mindful eating? Mindful eating is a practice that develops your awareness of eating habits and allows you to pause between your triggers and your actions. Most emotional eaters feel powerless over their food cravings. When the urge to eat hits, you feel an almost unbearable tension that demands to be fed, right  now. Because you've tried to resist in the past and failed, you believe that your willpower just isn't up to snuff. But the truth is that you have more power over your cravings than you think.  Take 5 before you give in to a craving Emotional eating tends to be automatic and virtually mindless. Before you even realize what you're doing, you've reached for a tub of ice cream and polished off half of it. But if you can take a moment to pause and reflect when you're hit with a craving, you give yourself the opportunity to make a different decision.  Can you put off eating for five minutes? Or just start with one minute. Don't tell yourself you can't give in to the craving; remember, the forbidden is extremely tempting. Just tell yourself to wait.  While you're waiting, check in with yourself. How are you feeling? What's going on emotionally? Even if you end up eating, you'll have a better understanding of why you did it. This can help you set yourself up for a different response next time.  How to practice mindful eating Eating while you're also doing other things-such as watching TV, driving, or playing with your phone-can prevent you from fully enjoying your food. Since your mind is elsewhere, you may not feel satisfied or continue eating even though you're no longer hungry. Eating more mindfully can help focus your mind on your food and the pleasure of a meal and curb  overeating.   Eat your meals in a calm place with no distractions, aside from any dining companions.  Try eating with your non-dominant hand or using chopsticks instead of a knife and fork. Eating in such a non-familiar way can slow down how fast you eat and ensure your mind stays focused on your food.  Allow yourself enough time not to have to rush your meal. Set a timer for 20 minutes and pace yourself so you spend at least that much time eating.  Take small bites and chew them well, taking time to notice the different flavors and textures of each mouthful.  Put your utensils down between bites. Take time to consider how you feel-hungry, satiated-before picking up your utensils again.  Try to stop eating before you are full.It takes time for the signal to reach your brain that you've had enough. Don't feel obligated to always clean your plate.  When you've finished your food, take a few moments to assess if you're really still hungry before opting for an extra serving or dessert.  Learn to accept your feelings-even the bad ones  While it may seem that the core problem is that you're powerless over food, emotional eating actually stems from feeling powerless over your emotions. You don't feel capable of dealing with your feelings head on, so you avoid them with food.  Recommended reading  Mini Habits for weight loss  Healthy Eating: A guide to the new nutrition - Nelson Report  10 Tips for Mindful Eating - How mindfulness can help you fully enjoy a meal and the experience of eating-with moderation and restraint. ([place])  Weight Loss: Gain Control of Emotional Eating - Tips to regain control of your eating habits. [facility])  Why Stress Causes People to Overeat -Tips on controlling stress eating. ([place])  Mindful Eating Meditations -Free online mindfulness meditations. ([facility])

## 2017-11-22 DIAGNOSIS — Z01419 Encounter for gynecological examination (general) (routine) without abnormal findings: Secondary | ICD-10-CM | POA: Diagnosis not present

## 2017-11-22 DIAGNOSIS — Z6831 Body mass index (BMI) 31.0-31.9, adult: Secondary | ICD-10-CM | POA: Diagnosis not present

## 2017-11-22 DIAGNOSIS — Z1231 Encounter for screening mammogram for malignant neoplasm of breast: Secondary | ICD-10-CM | POA: Diagnosis not present

## 2017-11-22 DIAGNOSIS — Z124 Encounter for screening for malignant neoplasm of cervix: Secondary | ICD-10-CM | POA: Diagnosis not present

## 2017-11-22 DIAGNOSIS — Z1389 Encounter for screening for other disorder: Secondary | ICD-10-CM | POA: Diagnosis not present

## 2017-11-22 LAB — CBC WITH DIFFERENTIAL/PLATELET
BASOS PCT: 0.5 %
Basophils Absolute: 32 cells/uL (ref 0–200)
Eosinophils Absolute: 32 cells/uL (ref 15–500)
Eosinophils Relative: 0.5 %
HCT: 37.5 % (ref 35.0–45.0)
Hemoglobin: 12.5 g/dL (ref 11.7–15.5)
LYMPHS ABS: 1966 {cells}/uL (ref 850–3900)
MCH: 28.2 pg (ref 27.0–33.0)
MCHC: 33.3 g/dL (ref 32.0–36.0)
MCV: 84.5 fL (ref 80.0–100.0)
MPV: 10 fL (ref 7.5–12.5)
Monocytes Relative: 5.1 %
NEUTROS PCT: 62.7 %
Neutro Abs: 3950 cells/uL (ref 1500–7800)
Platelets: 276 10*3/uL (ref 140–400)
RBC: 4.44 10*6/uL (ref 3.80–5.10)
RDW: 12.7 % (ref 11.0–15.0)
Total Lymphocyte: 31.2 %
WBC: 6.3 10*3/uL (ref 3.8–10.8)
WBCMIX: 321 {cells}/uL (ref 200–950)

## 2017-11-22 LAB — HEPATIC FUNCTION PANEL
AG RATIO: 1.8 (calc) (ref 1.0–2.5)
ALBUMIN MSPROF: 4.4 g/dL (ref 3.6–5.1)
ALT: 13 U/L (ref 6–29)
AST: 15 U/L (ref 10–35)
Alkaline phosphatase (APISO): 94 U/L (ref 33–115)
BILIRUBIN DIRECT: 0.1 mg/dL (ref 0.0–0.2)
GLOBULIN: 2.5 g/dL (ref 1.9–3.7)
Indirect Bilirubin: 0.3 mg/dL (calc) (ref 0.2–1.2)
Total Bilirubin: 0.4 mg/dL (ref 0.2–1.2)
Total Protein: 6.9 g/dL (ref 6.1–8.1)

## 2017-11-22 LAB — BASIC METABOLIC PANEL WITH GFR
BUN: 10 mg/dL (ref 7–25)
CALCIUM: 9.6 mg/dL (ref 8.6–10.2)
CO2: 28 mmol/L (ref 20–32)
Chloride: 103 mmol/L (ref 98–110)
Creat: 0.68 mg/dL (ref 0.50–1.10)
GFR, EST AFRICAN AMERICAN: 122 mL/min/{1.73_m2} (ref >=60)
GFR, Est Non African American: 106 mL/min/{1.73_m2} (ref >=60)
Glucose, Bld: 88 mg/dL (ref 65–99)
Potassium: 3.9 mmol/L (ref 3.5–5.3)
Sodium: 139 mmol/L (ref 135–146)

## 2017-11-22 LAB — LIPID PANEL
Cholesterol: 200 mg/dL — ABNORMAL HIGH (ref <200)
HDL: 49 mg/dL — ABNORMAL LOW (ref >50)
LDL CHOLESTEROL (CALC): 126 mg/dL — AB
Non-HDL Cholesterol (Calc): 151 mg/dL (calc) — ABNORMAL HIGH (ref <130)
TRIGLYCERIDES: 130 mg/dL (ref <150)
Total CHOL/HDL Ratio: 4.1 (calc) (ref <5.0)

## 2017-11-22 LAB — TSH: TSH: 2.26 m[IU]/L

## 2017-11-24 DIAGNOSIS — H5213 Myopia, bilateral: Secondary | ICD-10-CM | POA: Diagnosis not present

## 2017-12-04 NOTE — Progress Notes (Signed)
[facility] Note  12/05/2017     CHIEF COMPLAINT Patient presents for Retina Evaluation   HISTORY OF PRESENT ILLNESS: Tracy Kidd is a 47 y.o. female who presents to the clinic today for:   HPI    Retina Evaluation    In both eyes.  This started 5 years ago.  Associated Symptoms Floaters, Photophobia and Glare.  Negative for Blind Spot, Shoulder/Hip pain, Fatigue, Jaw Claudication, Redness, Scalp Tenderness, Weight Loss, Distortion, Flashes, Pain, Trauma and Fever.  Context:  distance vision, mid-range vision and near vision.  Treatments tried include laser.  Response to treatment was no improvement.          Comments    Referral of Dr. Ellie Lunch for retinal  eval. Patient states she is to cataract sx at [facility] after retina  clearance. 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 Pt is a previous patient of Dr. Zigmund Daniel , Dr. Rodena Piety preformed laser tx OD 2014. Pt uses [employer] eye gtt's PRN        Last edited by Zenovia Jordan, LPN on 0/93/2671  2:45 PM. (History)    Pt reports she was treated for choroidal melanoma just above ON; Pt states she is no longer treated due to having a severe reaction to Avastin, pt states she either had reaction to betadine or the medication; Pt states OS was severely swollen and painful, pt states that was in 2014; Pt states she was cleared for cataract sx by [facility]; Pt states she is ready to have cataract surgery; Pt reports having trichiasis while in hospital and reports having K scars as result OD;   Referring physician: Luberta Mutter, MD [place], [place] [address]  HISTORICAL INFORMATION:   Selected notes from the MEDICAL RECORD NUMBER Referred by Dr. Albina Billet for cataract surery clearance OS;  LEE- 03.01.19 ([facility]. [facility]) [BCVA OD: 20/20 OS: 20/50  MRx: OD: -7.00+1.00x030 OS: -5.50 sph] Ocular Hx- hx ocular melanoma OS (treated at [facility] with radiation in 2014), s/p laser retinopexy OD for lattice degen, cataract OS;  PMH-     CURRENT MEDICATIONS: Current Outpatient Medications (Ophthalmic Drugs)  Medication Sig  . carboxymethylcellulose (REFRESH PLUS) 0.5 % SOLN Apply to eye.   No current facility-administered medications for this visit.  (Ophthalmic Drugs)   Current Outpatient Medications (Other)  Medication Sig  . cholecalciferol (VITAMIN D) 1000 UNITS tablet Take 2,000 Units by mouth daily.  . Cyanocobalamin (VITAMIN B-12 PO) Take 1 tablet by mouth daily.  Marland Kitchen MELATONIN PO Take 1 tablet by mouth at bedtime.  Marland Kitchen SYNTHROID 112 MCG tablet TAKE 1 TABLET BY MOUTH DAILY BEFORE BREAKFAST.  Marland Kitchen vitamin B-12 (CYANOCOBALAMIN) 100 MCG tablet Vitamin B12  . fexofenadine (ALLEGRA) 180 MG tablet Take 180 mg by mouth daily.   No current facility-administered medications for this visit.  (Other)      REVIEW OF SYSTEMS: ROS    Positive for: Eyes, Heme/Lymph   Negative for: Constitutional, Gastrointestinal, Neurological, Skin, Genitourinary, Musculoskeletal, HENT, Endocrine, Cardiovascular, Respiratory, Psychiatric, Allergic/Imm   Last edited by Zenovia Jordan, LPN on 3/38/2505  3:97 PM. (History)       ALLERGIES Allergies  Allergen Reactions  . Oxycodone Nausea And Vomiting    Can take morphine and possibly Vicodin    PAST MEDICAL HISTORY Past Medical History:  Diagnosis Date  . Arthritis    hands and feet  . Cancer ([place])    ocular melanoma  . Hypothyroidism   . PONV (postoperative nausea and vomiting)    past surgery Scop patch and propofol helped  . Thyroid disease    Past Surgical History:  Procedure Laterality Date  . EYE SURGERY     radiation plaque placement and removal  . LAPAROSCOPY N/A 09/13/2013   Procedure: LAPAROSCOPY OPERATIVE, removal of pelvic mass;  Surgeon: Logan Bores, MD;  Location: [facility];   Service: Gynecology;  Laterality: N/A;  . WISDOM TOOTH EXTRACTION      FAMILY HISTORY Family History  Problem Relation Age of Onset  . Varicose Veins Mother   . Arthritis Mother   . Cancer Mother        Melanoma  . Nephrolithiasis Father   . Hyperlipidemia Father   . Autoimmune disease Father        atrophic gastritis/B12 def  . Cancer Maternal Grandmother        Lung  . Heart disease Maternal Grandfather        CHF  . Cancer Maternal Aunt 68       melanoma    SOCIAL HISTORY Social History   Tobacco Use  . Smoking status: Never Smoker  . Smokeless tobacco: Never Used  Substance Use Topics  . Alcohol use: Yes    Comment: rare  . Drug use: No         OPHTHALMIC EXAM:  Base Eye Exam    Visual Acuity (Snellen - Linear)      Right Left   Dist cc 20/25 -1 20/60   Dist ph cc NI 20/40 -1   Correction:  Glasses       Tonometry (Tonopen, 2:06 PM)      Right Left   Pressure 17 14       Pupils      Dark Light Shape React APD   Right 4 2 Round Brisk None   Left 4 3 Round Slow +1       Visual Fields (Counting fingers)      Left Right     Full   Restrictions Partial outer inferior nasal deficiency        Extraocular Movement      Right Left    Full, Ortho Full, Ortho       Neuro/Psych    Oriented x3:  Yes   Mood/Affect:  Normal       Dilation    Both eyes:  1.0% Mydriacyl, 2.5% Phenylephrine @ 2:06 PM        Slit Lamp and Fundus Exam    Slit Lamp Exam      Right Left   Lids/Lashes Dermatochalasis - upper lid Dermatochalasis - upper lid   Conjunctiva/Sclera White and quiet White and quiet   Cornea mild focal K haze superiorly punctate sub-epithelial opacities    Anterior Chamber Deep and quiet Deep and quiet   Iris Round and dilated Round and dilated to 42mm   Lens 1+ Nuclear sclerosis, 1+ Cortical cataract 2+ Nuclear sclerosis, 2+ Cortical cataract, 3+ Posterior subcapsular cataract   Vitreous Mild Vitreous syneresis Mild Vitreous syneresis        Fundus Exam      Right Left   Disc Normal Normal   C/D Ratio 0.35 0.35   Macula Good foveal reflex, No heme or edema Flat, Retinal pigment epithelial mottling and atrophy   Vessels Normal Superior  Vascular attenuation overlying melanoma,    Periphery Attached, Lattice degeneration with good laser surrounding at 0730, 1030 to 1130 Attached, regressed choroidal melanoma off superior disc, + edema overlying melanoma but no heme, large round area of chorioretinal atrophy at 1200 anterior to melanoma, pigmented Chorioretinal scars at 0130, 0200, pigmented chorioretinal atrophy spanning from 0300 to 0400        Refraction    Wearing Rx      Sphere Cylinder Axis   Right -6.50 +0.50 006   Left -5.50 Sphere    Age:  6   Type:  PAL       Manifest Refraction      Sphere Cylinder Axis Dist VA   Right -7.00 +1.00 030 20/20-2   Left -5.50 +0.50 015 20/60+2          IMAGING AND PROCEDURES  Imaging and Procedures for 12/05/17  OCT, Retina - OU - Both Eyes     Right Eye Quality was good. Central Foveal Thickness: 254. Progression has no prior data. Findings include normal foveal contour, no IRF, no SRF.   Left Eye Quality was borderline. Central Foveal Thickness: 229. Progression has no prior data. Findings include normal foveal contour, outer retinal atrophy, intraretinal fluid, no SRF (Intraretinal fluid superiorly, diffuse outer retinal atrophy).   Notes *Images captured and stored on drive  Diagnosis / Impression:  OD: NFP, No IRF/SRF OS: NFP, IRF/edema superior macula, diffuse ORA  Clinical management:  See below  Abbreviations: NFP - Normal foveal profile. CME - cystoid macular edema. PED - pigment epithelial detachment. IRF - intraretinal fluid. SRF - subretinal fluid. EZ - ellipsoid zone. ERM - epiretinal membrane. ORA - outer retinal atrophy. ORT - outer retinal tubulation. SRHM - subretinal hyper-reflective material                  ASSESSMENT/PLAN:     ICD-10-CM   1. 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 Post-radiation retinopathy, sequela T66.XXXS    H35.89   3. Retinal edema H35.81 OCT, Retina - OU - Both Eyes  4. 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 Other cataract of left eye H26.8   6. Nuclear sclerosis, right H25.11     1,2,3. History of choroidal melanoma s/p I-125 brachytherapy 2014 OS  - underwent plaque therapy at [facility] [place] in 2014 - history of multiple IVA injections post plaque, q6 mos, last being "several years ago" -- stopped due to adverse reaction to injection / betadine - fundus exam shows regressed choroidal melanoma -- no heme or SRF - OCT today shows retinal edema in superior macula, but BCVA remains 20/40 - recommend observation for now -- will f/u in 6-8 wks for repeat OCT   4. Lattice degeneration, OD - s/p laser retinopexy with Dr. Zigmund Daniel - good laser in place - stable - no lattice OS  5. Visually-significant, radiation-induced cataract, OS - significant PSC component causing significant glare symptoms - under the expert management of Dr. Ellie Lunch - clear from a retina standpoint to proceed with cataract surgery OS when pt and Dr. Ellie Lunch are ready  6. Nuclear sclerosis, OD - The symptoms of cataract, surgical options, and treatments and risks were discussed with patient. - discussed diagnosis and progression - not yet visually significant - monitor for now    Ophthalmic Meds Ordered this visit:  No orders of the defined types were placed in this encounter.      Return  in about 7 weeks (around 01/23/2018) for F/U hx choroidal melanoma OS.  There are no Patient Instructions on file for this visit.   Explained the diagnoses, plan, and follow up with the patient and they expressed understanding.  Patient expressed understanding of the importance of proper follow up care.   This document serves as a record of services personally performed  by Gardiner Sleeper, MD, PhD. It was created on their behalf by Catha Brow, [place], a certified ophthalmic assistant. The creation of this record is the provider's dictation and/or activities during the visit.  Electronically signed by: Catha Brow, [place]  12/05/17 10:14 PM   Gardiner Sleeper, M.D., Ph.D. Diseases & Surgery of the Retina and [place] 12/05/17  I have reviewed the above documentation for accuracy and completeness, and I agree with the above. Gardiner Sleeper, M.D., Ph.D. 12/05/17 11:02 PM    Abbreviations: M myopia (nearsighted); A astigmatism; H hyperopia (farsighted); P presbyopia; Mrx spectacle prescription;  CTL contact lenses; OD right eye; OS left eye; OU both eyes  XT exotropia; ET esotropia; PEK punctate epithelial keratitis; PEE punctate epithelial erosions; DES dry eye syndrome; MGD meibomian gland dysfunction; ATs artificial tears; PFAT's preservative free artificial tears; [place] nuclear sclerotic cataract; PSC posterior subcapsular cataract; ERM epi-retinal membrane; PVD posterior vitreous detachment; RD retinal detachment; DM diabetes mellitus; DR diabetic retinopathy; NPDR non-proliferative diabetic retinopathy; PDR proliferative diabetic retinopathy; CSME clinically significant macular edema; DME diabetic macular edema; dbh dot blot hemorrhages; CWS cotton wool spot; POAG primary open angle glaucoma; C/D cup-to-disc ratio; HVF humphrey visual field; GVF goldmann visual field; OCT optical coherence tomography; IOP intraocular pressure; BRVO Branch retinal vein occlusion; CRVO central retinal vein occlusion; CRAO central retinal artery occlusion; BRAO branch retinal artery occlusion; RT retinal tear; SB scleral buckle; PPV pars plana vitrectomy; VH Vitreous hemorrhage; PRP panretinal laser photocoagulation; IVK intravitreal kenalog; VMT vitreomacular traction; MH Macular hole;  NVD neovascularization of the disc; NVE neovascularization  elsewhere; AREDS age related eye disease study; ARMD age related macular degeneration; POAG primary open angle glaucoma; EBMD epithelial/anterior basement membrane dystrophy; ACIOL anterior chamber intraocular lens; IOL intraocular lens; PCIOL posterior chamber intraocular lens; Phaco/IOL phacoemulsification with intraocular lens placement; [place] photorefractive keratectomy; LASIK laser assisted in situ keratomileusis; HTN hypertension; DM diabetes mellitus; COPD chronic obstructive pulmonary disease

## 2017-12-05 ENCOUNTER — Encounter (INDEPENDENT_AMBULATORY_CARE_PROVIDER_SITE_OTHER): Payer: Self-pay | Admitting: Ophthalmology

## 2017-12-05 ENCOUNTER — Ambulatory Visit (INDEPENDENT_AMBULATORY_CARE_PROVIDER_SITE_OTHER): Payer: 59 | Admitting: Ophthalmology

## 2017-12-05 DIAGNOSIS — H3581 Retinal edema: Secondary | ICD-10-CM

## 2017-12-05 DIAGNOSIS — C6932 Malignant neoplasm of left choroid: Secondary | ICD-10-CM

## 2017-12-05 DIAGNOSIS — H3589 Other specified retinal disorders: Secondary | ICD-10-CM

## 2017-12-05 DIAGNOSIS — H35411 Lattice degeneration of retina, right eye: Secondary | ICD-10-CM

## 2017-12-05 DIAGNOSIS — H268 Other specified cataract: Secondary | ICD-10-CM | POA: Diagnosis not present

## 2017-12-05 DIAGNOSIS — H2511 Age-related nuclear cataract, right eye: Secondary | ICD-10-CM

## 2017-12-05 DIAGNOSIS — T66XXXS Radiation sickness, unspecified, sequela: Secondary | ICD-10-CM

## 2018-01-02 DIAGNOSIS — C6932 Malignant neoplasm of left choroid: Secondary | ICD-10-CM | POA: Diagnosis not present

## 2018-01-04 ENCOUNTER — Other Ambulatory Visit: Payer: Self-pay | Admitting: Internal Medicine

## 2018-01-04 MED FILL — SYNTHROID 112 MCG TABLET: 112 | 90 days supply | Qty: 90 | Fill #0

## 2018-01-25 ENCOUNTER — Telehealth (INDEPENDENT_AMBULATORY_CARE_PROVIDER_SITE_OTHER): Payer: Self-pay | Admitting: Ophthalmology

## 2018-01-25 NOTE — Telephone Encounter (Signed)
Please let her know that she can reschedule if she wants to, but my preference would be to see her on the 7th to see if there is any change in her retinal fluid / swelling.  Gardiner Sleeper, M.D., Ph.D. Diseases & Surgery of the Retina and [place]

## 2018-01-29 NOTE — Progress Notes (Signed)
[facility] Note  01/30/2018     CHIEF COMPLAINT Patient presents for Retina Follow Up   HISTORY OF PRESENT ILLNESS: Tracy Kidd is a 47 y.o. female who presents to the clinic today for:   HPI    Retina Follow Up    Patient presents with  Other.  In left eye.  This started 4 years ago.  Severity is moderate.  Duration of 4 years.  Since onset it is stable.  I, the attending physician,  performed the HPI with the patient and updated documentation appropriately.          Comments    47 y/o female pt here for 7 wk f/u for malignant melanoma of choroid OS.  No change in vision ou.  Denies pain, flashes, floaters.  No gtts.  Was seen at [facility] on 04.09.2019.       Last edited by Bernarda Caffey, MD on 01/30/2018  3:14 PM. (History)    Pt states she has not noticed any VA changes since being seen last;  Referring physician: Luberta Mutter, MD [place], [place] [address]  HISTORICAL INFORMATION:   Selected notes from the MEDICAL RECORD NUMBER Referred by Dr. Albina Billet for cataract surery clearance OS;  LEE- 03.01.19 ([facility]. [facility]) [BCVA OD: 20/20 OS: 20/50 MRx: OD: -7.00+1.00x030 OS: -5.50 sph] Ocular Hx- hx ocular melanoma OS (treated at [facility] with radiation in 2014), s/p laser retinopexy OD for lattice degen, cataract OS;  PMH-     CURRENT MEDICATIONS: Current Outpatient Medications (Ophthalmic Drugs)  Medication Sig  . carboxymethylcellulose (REFRESH PLUS) 0.5 % SOLN Apply to eye.   No current facility-administered medications for this visit.  (Ophthalmic Drugs)   Current Outpatient Medications (Other)  Medication Sig  . B Complex CAPS Take by mouth.  . cholecalciferol (VITAMIN D) 1000 UNITS tablet Take 2,000 Units by mouth daily.  . Cyanocobalamin (VITAMIN B-12 PO) Take 1 tablet by mouth daily.  . fexofenadine (ALLEGRA) 180 MG tablet Take 180 mg by mouth daily.  Marland Kitchen MELATONIN PO Take 1 tablet by mouth at bedtime.  Marland Kitchen SYNTHROID 112  MCG tablet TAKE 1 TABLET BY MOUTH DAILY BEFORE BREAKFAST.  Marland Kitchen vitamin B-12 (CYANOCOBALAMIN) 100 MCG tablet Vitamin B12   No current facility-administered medications for this visit.  (Other)      REVIEW OF SYSTEMS: ROS    Positive for: Eyes   Negative for: Constitutional, Gastrointestinal, Neurological, Skin, Genitourinary, Musculoskeletal, HENT, Endocrine, Cardiovascular, Respiratory, Psychiatric, Allergic/Imm, Heme/Lymph   Last edited by Matthew Folks, COA on 01/30/2018  2:36 PM. (History)       ALLERGIES Allergies  Allergen Reactions  . Oxycodone Nausea And Vomiting    Can take morphine and possibly Vicodin    PAST MEDICAL HISTORY Past Medical History:  Diagnosis Date  . Arthritis    hands and feet  . Cancer ([place])    ocular melanoma  . Hypothyroidism   . PONV (postoperative nausea and vomiting)    past surgery Scop patch and propofol helped  . Thyroid disease    Past Surgical History:  Procedure Laterality Date  . EYE SURGERY     radiation plaque placement and removal  . LAPAROSCOPY N/A 09/13/2013   Procedure: LAPAROSCOPY OPERATIVE, removal of pelvic mass;  Surgeon: Logan Bores, MD;  Location: [facility];  Service: Gynecology;  Laterality: N/A;  . WISDOM TOOTH EXTRACTION      FAMILY HISTORY Family History  Problem Relation Age of  Onset  . Varicose Veins Mother   . Arthritis Mother   . Cancer Mother        Melanoma  . Nephrolithiasis Father   . Hyperlipidemia Father   . Autoimmune disease Father        atrophic gastritis/B12 def  . Cancer Maternal Grandmother        Lung  . Heart disease Maternal Grandfather        CHF  . Cancer Maternal Aunt 15       melanoma    SOCIAL HISTORY Social History   Tobacco Use  . Smoking status: Never Smoker  . Smokeless tobacco: Never Used  Substance Use Topics  . Alcohol use: Yes    Comment: rare  . Drug use: No         OPHTHALMIC EXAM:  Base Eye Exam    Visual Acuity (Snellen - Linear)      Right  Left   Dist [place]  -    Dist cc 20/20 -2 20/60 +2   Dist ph cc  20/50 +2   Correction:  Glasses       Tonometry (Tonopen, 2:35 PM)      Right Left   Pressure 16 16       Pupils      Dark Light Shape React APD   Right 4 2 Round Brisk None   Left 4 3 Round Slow +1       Visual Fields (Counting fingers)      Left Right    Full Full       Extraocular Movement      Right Left    Full, Ortho Full, Ortho       Neuro/Psych    Oriented x3:  Yes   Mood/Affect:  Normal       Dilation    Both eyes:  1.0% Mydriacyl, 2.5% Phenylephrine @ 2:35 PM        Slit Lamp and Fundus Exam    Slit Lamp Exam      Right Left   Lids/Lashes Dermatochalasis - upper lid Dermatochalasis - upper lid   Conjunctiva/Sclera White and quiet White and quiet   Cornea mild focal K haze superiorly punctate sub-epithelial opacities, trace Punctate epithelial erosions   Anterior Chamber Deep and quiet Deep and quiet   Iris Round and dilated Round and dilated to 32mm   Lens 1+ Nuclear sclerosis, 1+ Cortical cataract 2+ Nuclear sclerosis, 2+ Cortical cataract, 3+ Posterior subcapsular cataract   Vitreous Mild Vitreous syneresis Mild Vitreous syneresis       Fundus Exam      Right Left   Disc Normal Normal   C/D Ratio 0.35 0.35   Macula Good foveal reflex, No heme or edema Flat, Retinal pigment epithelial mottling and atrophy   Vessels Normal Superior Vascular attenuation overlying melanoma,    Periphery Attached, Lattice degeneration with good laser surrounding at 0730, 1030 to 1130 Attached, regressed choroidal melanoma off superior disc, + edema overlying melanoma but no heme, large round area of chorioretinal atrophy at 1200 anterior to melanoma, pigmented Chorioretinal scars at 0130, 0200, pigmented chorioretinal atrophy spanning from 0300 to 0400          IMAGING AND PROCEDURES  Imaging and Procedures for 12/05/17  OCT, Retina - OU - Both Eyes       Right Eye Quality was good. Central Foveal  Thickness: 253. Progression has been stable. Findings include normal foveal contour, no IRF, no SRF.  Left Eye Quality was good. Central Foveal Thickness: 231. Progression has been stable. Findings include normal foveal contour, outer retinal atrophy, intraretinal fluid, no SRF (Intraretinal fluid superiorly, diffuse outer retinal atrophy).   Notes *Images captured and stored on drive  Diagnosis / Impression:  OD: NFP, No IRF/SRF OS: NFP, IRF/edema superior macula, diffuse ORA -- stable from prior  Clinical management:  See below  Abbreviations: NFP - Normal foveal profile. CME - cystoid macular edema. PED - pigment epithelial detachment. IRF - intraretinal fluid. SRF - subretinal fluid. EZ - ellipsoid zone. ERM - epiretinal membrane. ORA - outer retinal atrophy. ORT - outer retinal tubulation. SRHM - subretinal hyper-reflective material                  ASSESSMENT/PLAN:    ICD-10-CM   1. 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 Post-radiation retinopathy, sequela T66.XXXS    H35.89   3. Retinal edema H35.81 OCT, Retina - OU - Both Eyes  4. 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 Other cataract of left eye H26.8   6. Nuclear sclerosis, right H25.11     1,2,3. History of choroidal melanoma s/p I-125 brachytherapy 2014 OS  - underwent plaque therapy at [place] in 2014 - history of multiple IVA injections post plaque, q6 mos, last being "several years ago" -- stopped due to adverse reaction to injection / betadine - recently saw Dr. Boyd Kerbs in f/u - fundus exam shows regressed choroidal melanoma -- no heme or SRF -- stable from prior - OCT today shows stable retinal edema in superior macula - BCVA remains 20/40 - continue monitoring - will f/u in 3-4 months for repeat OCT   4. Lattice degeneration, OD - s/p laser retinopexy with Dr. Zigmund Daniel - good laser in place - stable - no lattice OS - monitor  5.  Visually-significant, radiation-induced cataract, OS - significant PSC component causing significant glare symptoms - under the expert management of Dr. Ellie Lunch - has an appointment with Dr. Ellie Lunch May 21st - clear from a retina standpoint to proceed with cataract surgery OS when pt and Dr. Ellie Lunch are ready  6. Nuclear sclerosis, OD - The symptoms of cataract, surgical options, and treatments and risks were discussed with patient. - discussed diagnosis and progression - not yet visually significant - monitor for now    Ophthalmic Meds Ordered this visit:  No orders of the defined types were placed in this encounter.      Return in about 3 months (around 05/02/2018) for F/U Hx chor melanoma OS, DFE, OCT.  There are no Patient Instructions on file for this visit.   Explained the diagnoses, plan, and follow up with the patient and they expressed understanding.  Patient expressed understanding of the importance of proper follow up care.   This document serves as a record of services personally performed by Gardiner Sleeper, MD, PhD. It was created on their behalf by Catha Brow, [place], a certified ophthalmic assistant. The creation of this record is the provider's dictation and/or activities during the visit.  Electronically signed by: Catha Brow, COA  05.06.19 3:56 PM    Gardiner Sleeper, M.D., Ph.D. Diseases & Surgery of the Retina and [place] 01/29/18  I have reviewed the above documentation for accuracy and completeness, and I agree with the above. Gardiner Sleeper, M.D., Ph.D. 02/02/18 4:01 PM    Abbreviations: M myopia (nearsighted);  A astigmatism; H hyperopia (farsighted); P presbyopia; Mrx spectacle prescription;  CTL contact lenses; OD right eye; OS left eye; OU both eyes  XT exotropia; ET esotropia; PEK punctate epithelial keratitis; PEE punctate epithelial erosions; DES dry eye syndrome; MGD meibomian gland dysfunction; ATs  artificial tears; PFAT's preservative free artificial tears; [place] nuclear sclerotic cataract; PSC posterior subcapsular cataract; ERM epi-retinal membrane; PVD posterior vitreous detachment; RD retinal detachment; DM diabetes mellitus; DR diabetic retinopathy; NPDR non-proliferative diabetic retinopathy; PDR proliferative diabetic retinopathy; CSME clinically significant macular edema; DME diabetic macular edema; dbh dot blot hemorrhages; CWS cotton wool spot; POAG primary open angle glaucoma; C/D cup-to-disc ratio; HVF humphrey visual field; GVF goldmann visual field; OCT optical coherence tomography; IOP intraocular pressure; BRVO Branch retinal vein occlusion; CRVO central retinal vein occlusion; CRAO central retinal artery occlusion; BRAO branch retinal artery occlusion; RT retinal tear; SB scleral buckle; PPV pars plana vitrectomy; VH Vitreous hemorrhage; PRP panretinal laser photocoagulation; IVK intravitreal kenalog; VMT vitreomacular traction; MH Macular hole;  NVD neovascularization of the disc; NVE neovascularization elsewhere; AREDS age related eye disease study; ARMD age related macular degeneration; POAG primary open angle glaucoma; EBMD epithelial/anterior basement membrane dystrophy; ACIOL anterior chamber intraocular lens; IOL intraocular lens; PCIOL posterior chamber intraocular lens; Phaco/IOL phacoemulsification with intraocular lens placement; [place] photorefractive keratectomy; LASIK laser assisted in situ keratomileusis; HTN hypertension; DM diabetes mellitus; COPD chronic obstructive pulmonary disease

## 2018-01-30 ENCOUNTER — Ambulatory Visit (INDEPENDENT_AMBULATORY_CARE_PROVIDER_SITE_OTHER): Payer: 59 | Admitting: Ophthalmology

## 2018-01-30 ENCOUNTER — Encounter (INDEPENDENT_AMBULATORY_CARE_PROVIDER_SITE_OTHER): Payer: Self-pay | Admitting: Ophthalmology

## 2018-01-30 DIAGNOSIS — H35411 Lattice degeneration of retina, right eye: Secondary | ICD-10-CM | POA: Diagnosis not present

## 2018-01-30 DIAGNOSIS — H3589 Other specified retinal disorders: Secondary | ICD-10-CM | POA: Diagnosis not present

## 2018-01-30 DIAGNOSIS — H268 Other specified cataract: Secondary | ICD-10-CM | POA: Diagnosis not present

## 2018-01-30 DIAGNOSIS — H2511 Age-related nuclear cataract, right eye: Secondary | ICD-10-CM

## 2018-01-30 DIAGNOSIS — H3581 Retinal edema: Secondary | ICD-10-CM

## 2018-01-30 DIAGNOSIS — C6932 Malignant neoplasm of left choroid: Secondary | ICD-10-CM

## 2018-01-30 DIAGNOSIS — T66XXXS Radiation sickness, unspecified, sequela: Secondary | ICD-10-CM

## 2018-02-02 ENCOUNTER — Encounter (INDEPENDENT_AMBULATORY_CARE_PROVIDER_SITE_OTHER): Payer: Self-pay | Admitting: Ophthalmology

## 2018-02-13 DIAGNOSIS — H25042 Posterior subcapsular polar age-related cataract, left eye: Secondary | ICD-10-CM | POA: Diagnosis not present

## 2018-02-14 MED FILL — DUREZOL 0.05% EYE DROPS: 0.05 | 25 days supply | Qty: 5 | Fill #0

## 2018-02-14 MED FILL — MOXIFLOXACIN 0.5% EYE DROPS: 0.5 | 15 days supply | Qty: 3 | Fill #0

## 2018-02-24 HISTORY — PX: CATARACT EXTRACTION: SUR2

## 2018-03-12 DIAGNOSIS — D2272 Melanocytic nevi of left lower limb, including hip: Secondary | ICD-10-CM | POA: Diagnosis not present

## 2018-03-12 DIAGNOSIS — D2262 Melanocytic nevi of left upper limb, including shoulder: Secondary | ICD-10-CM | POA: Diagnosis not present

## 2018-03-12 DIAGNOSIS — D2271 Melanocytic nevi of right lower limb, including hip: Secondary | ICD-10-CM | POA: Diagnosis not present

## 2018-03-12 DIAGNOSIS — D2372 Other benign neoplasm of skin of left lower limb, including hip: Secondary | ICD-10-CM | POA: Diagnosis not present

## 2018-03-12 DIAGNOSIS — D225 Melanocytic nevi of trunk: Secondary | ICD-10-CM | POA: Diagnosis not present

## 2018-03-12 DIAGNOSIS — D2261 Melanocytic nevi of right upper limb, including shoulder: Secondary | ICD-10-CM | POA: Diagnosis not present

## 2018-03-15 DIAGNOSIS — H268 Other specified cataract: Secondary | ICD-10-CM | POA: Diagnosis not present

## 2018-03-15 DIAGNOSIS — H2512 Age-related nuclear cataract, left eye: Secondary | ICD-10-CM | POA: Diagnosis not present

## 2018-03-15 DIAGNOSIS — H25812 Combined forms of age-related cataract, left eye: Secondary | ICD-10-CM | POA: Diagnosis not present

## 2018-03-20 ENCOUNTER — Telehealth (INDEPENDENT_AMBULATORY_CARE_PROVIDER_SITE_OTHER): Payer: Self-pay

## 2018-03-20 NOTE — Telephone Encounter (Signed)
Pt called and left message stating the she had cataract sx on June 20th and Dr. Coralyn Pear wanted her to follow up in a month. Pt stated that she has an appointment scheduled for August 7th but wanted to ensure she didn't need to move appointment up closer to July 20th; Spoke to Dr. Coralyn Pear - he stated August 7th is exceptable but if pt would be more comfortable to have closer to July 20th we were able to move appointment; Called pt back and relayed information, pt stated she was satisfied with keeping August 7th appointment;   Catha Brow, [place]

## 2018-04-04 MED FILL — SYNTHROID 112 MCG TABLET: 112 | 90 days supply | Qty: 90 | Fill #1

## 2018-05-01 NOTE — Progress Notes (Signed)
[facility]  05/02/2018     CHIEF COMPLAINT Patient presents for Retina Follow Up   HISTORY OF PRESENT ILLNESS: [facility] is a 47 y.o. female who presents to the clinic today for:   HPI    Retina Follow Up    In left eye.  Severity is mild.  Since onset it is rapidly improving.  I, the attending physician,  performed the HPI with the patient and updated documentation appropriately.          Comments    F/U S/P cataract sx Os 03/21/18. Patient states her vision has greatly improved since cataract sx. Denies ocular pain and  new visual onsets.       Last edited by Tracy Caffey, MD on 05/02/2018  5:06 PM. (History)      Referring physician: Unk Pinto, MD [facility], [facility] [id number]  HISTORICAL INFORMATION:   Selected notes from the MEDICAL RECORD NUMBER Referred by Dr. 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 carboxymethylcellulose (REFRESH PLUS) 0.5 % SOLN Apply to eye.   No current facility-administered medications for this visit.  (Ophthalmic Drugs)   Current Outpatient Medications (Other)  Medication Sig  . [facility] Take by mouth.  . cholecalciferol (VITAMIN D) 1000 UNITS tablet Take 2,000 Units by mouth daily.  . [facility] (VITAMIN Tracy-12 Kidd) Take 1 tablet by mouth daily.  Marland Kitchen [facility] Take 1 tablet by mouth at bedtime.  Marland Kitchen [facility] 112 MCG tablet TAKE 1 TABLET BY MOUTH DAILY BEFORE BREAKFAST.  Marland Kitchen vitamin Tracy-12 ([facility]) 100 MCG tablet Vitamin B12  . fexofenadine (ALLEGRA) 180 MG tablet Take 180 mg by mouth daily.   No current facility-administered  medications for this visit.  (Other)      REVIEW OF SYSTEMS: ROS    Positive for: Eyes   Negative for: Constitutional, Gastrointestinal, Neurological, Skin, Genitourinary, Musculoskeletal, HENT, Endocrine, Cardiovascular, Respiratory, Psychiatric, Allergic/Imm, Heme/Lymph   Last edited by Tracy Jordan, LPN on 03/28/2201  5:42 PM. (History)       ALLERGIES Allergies  Allergen Reactions  . [facility] Nausea And Vomiting    Can take morphine and possibly Vicodin    PAST MEDICAL HISTORY Past Medical History:  Diagnosis Date  . Arthritis    hands and feet  . Cancer ([facility])    ocular melanoma  . Hypothyroidism   . PONV (postoperative nausea and vomiting)    past surgery Scop patch and propofol helped  . Thyroid disease    Past Surgical History:  Procedure Laterality Date  . EYE SURGERY     radiation plaque placement and removal  . LAPAROSCOPY N/A 09/13/2013   Procedure: LAPAROSCOPY OPERATIVE, removal of pelvic mass;  Surgeon: [facility] Bores, MD;  Location: [facility];  Service: Gynecology;  Laterality: N/A;  . WISDOM TOOTH EXTRACTION      FAMILY HISTORY Family History  Problem Relation Age of Onset  . Varicose Veins Mother   . Arthritis Mother   . Cancer Mother        Melanoma  . Nephrolithiasis Father   . Hyperlipidemia Father   . Autoimmune disease Father  atrophic gastritis/B12 def  . Cancer Maternal Grandmother        Lung  . Heart disease Maternal Grandfather        CHF  . Cancer Maternal Aunt 11       melanoma    SOCIAL HISTORY Social History   Tobacco Use  . Smoking status: Never Smoker  . Smokeless tobacco: Never Used  Substance Use Topics  . Alcohol use: Yes    Comment: rare  . Drug use: No         OPHTHALMIC EXAM:  Base Eye Exam    Visual Acuity (Snellen - Linear)      Right Left   Dist cc 20/25 -1 20/50 -2   Dist ph cc 20/20 -2 20/40 -2   Correction:  Contacts       Tonometry (Tonopen, 3:04 PM)      Right Left    Pressure 14 18       Pupils      Dark Light Shape React APD   Right 4 2 Round Brisk None   Left 4 2 Round Brisk None       Visual Fields (Counting fingers)      Left Right     Full   Restrictions Partial outer inferior nasal deficiency        Extraocular Movement      Right Left    Full, Ortho Full, Ortho       Neuro/Psych    Oriented x3:  Yes   Mood/Affect:  Normal       Dilation    Left eye:  1.0% Mydriacyl, 2.5% Phenylephrine @ 3:04 PM        Slit Lamp and Fundus Exam    Slit Lamp Exam      Right Left   Lids/Lashes Dermatochalasis - upper lid Dermatochalasis - upper lid, sparse lashes nasal UL, mild  Telangiectasia   Conjunctiva/Sclera White and quiet White and quiet   Cornea mild focal K haze superiorly punctate sub-epithelial opacities, trace Punctate epithelial erosions   Anterior Chamber Deep and quiet Deep and quiet   Iris Round and dilated Round and dilated to 60mm   Lens 1+ Nuclear sclerosis, 1+ Cortical cataract PC IOL in good position, 1+ Posterior capsular opacification   Vitreous Mild Vitreous syneresis Vitreous syneresis, +pigment, Posterior vitreous detachment       Fundus Exam      Right Left   Disc  sharp   C/D Ratio 0.35 0.55   Macula  Flat, Retinal pigment epithelial mottling and atrophy   Vessels  Superior Vascular attenuation overlying melanoma   Periphery  Attached, regressed choroidal melanoma off superior disc, + edema overlying melanoma but no heme, large round area of chorioretinal atrophy at 1200 anterior to melanoma, pigmented Chorioretinal scars at 0130, 0200, pigmented chorioretinal atrophy spanning from 0300 to 0400, peripheral cystoid degeneration and VR tufts at 0900 ora          IMAGING AND PROCEDURES  Imaging and Procedures for 12/05/17  OCT, Retina - OU - Both Eyes       Right Eye Quality was good. Central Foveal Thickness: 256. Progression has been stable. Findings include normal foveal contour, no IRF, no SRF.    Left Eye Quality was good. Central Foveal Thickness: 240. Progression has been stable. Findings include normal foveal contour, outer retinal atrophy, intraretinal fluid, no SRF (Intraretinal fluid superiorly, diffuse outer retinal atrophy).   Notes *Images captured and stored on drive  Diagnosis /  Impression:  OD: NFP, No IRF/SRF OS: NFP, IRF/edema superior macula, diffuse ORA -- stable from prior  Clinical management:  See below  Abbreviations: NFP - Normal foveal profile. CME - cystoid macular edema. PED - pigment epithelial detachment. IRF - intraretinal fluid. SRF - subretinal fluid. EZ - ellipsoid zone. ERM - epiretinal membrane. ORA - outer retinal atrophy. ORT - outer retinal tubulation. SRHM - subretinal hyper-reflective material                  ASSESSMENT/PLAN:    ICD-10-CM   1. 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 Post-radiation retinopathy, sequela T66.XXXS    H35.89   3. Retinal edema H35.81 OCT, Retina - OU - Both Eyes  4. Posterior vitreous detachment of left eye H43.812   5. 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 Nuclear sclerosis, right H25.11   7. Pseudophakia of left eye Z96.1     1,2,3. 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 Boyd Kerbs in f/u - fundus exam shows regressed choroidal melanoma -- no heme or SRF -- stable from prior - OCT today shows stable retinal edema in superior macula - BCVA remains 20/40 - continue monitoring - will f/u in 6 mos for repeat OCT  4. PVD / vitreous syneresis OS  Intermittent FOL OS temporal visual field -- inc frequency following cataract surgery  No RT or RD on 360 scleral depressed exam, but does have VR tufts at nasal  ora  Discussed findings and prognosis  Reviewed s/s of RT/RD  Strict return precautions for any such RT/RD signs/symptoms  F/u 6 wks for recheck  5. Lattice degeneration, OD - s/p laser retinopexy with Dr. Zigmund Daniel - good laser in place - stable - no lattice OS - monitor  6. Pseudophakia OS  - s/p CE/IOL OS by expert surgeon Dr. Ellie Lunch  - beautiful surgery, doing well  - monitor  7. Nuclear sclerosis, OD - The symptoms of cataract, surgical options, and treatments and risks were discussed with patient. - discussed diagnosis and progression - not yet visually significant - monitor for now    Ophthalmic Meds Ordered this visit:  No orders of the defined types were placed in this encounter.      Return in about 6 weeks (around 06/13/2018).  There are no Patient Instructions on file for this visit.   Explained the diagnoses, plan, and follow up with the patient and they expressed understanding.  Patient expressed understanding of the importance of proper follow up care.   This document serves as a record of services personally performed by Gardiner Sleeper, MD, PhD. It was created on their behalf by Ernest Mallick, OA, an ophthalmic assistant. The creation of this record is the provider's dictation and/or activities during the visit.    Electronically signed by: Ernest Mallick, OA  08.06.2019 10:31 AM    Gardiner Sleeper, M.D., Ph.D. Diseases & Surgery of the Retina and Vitreous [place]   I have reviewed the above documentation for accuracy and completeness, and I agree with the above. Gardiner Sleeper, M.D., Ph.D. 05/03/18 10:31 AM      Abbreviations: M myopia (nearsighted); A astigmatism; H hyperopia (farsighted);  P presbyopia; Mrx spectacle prescription;  CTL contact lenses; OD right eye; OS left eye; OU both eyes  XT exotropia; ET esotropia; PEK punctate epithelial keratitis; PEE punctate epithelial erosions; DES dry eye syndrome; MGD  meibomian gland dysfunction; ATs artificial tears; PFAT's preservative free artificial tears; [place] nuclear sclerotic cataract; PSC posterior subcapsular cataract; ERM epi-retinal membrane; PVD posterior vitreous detachment; RD retinal detachment; DM diabetes mellitus; DR diabetic retinopathy; NPDR non-proliferative diabetic retinopathy; PDR proliferative diabetic retinopathy; CSME clinically significant macular edema; DME diabetic macular edema; dbh dot blot hemorrhages; CWS cotton wool spot; POAG primary open angle glaucoma; C/D cup-to-disc ratio; HVF humphrey visual field; GVF goldmann visual field; OCT optical coherence tomography; IOP intraocular pressure; BRVO Branch retinal vein occlusion; CRVO central retinal vein occlusion; CRAO central retinal artery occlusion; BRAO branch retinal artery occlusion; RT retinal tear; SB scleral buckle; PPV pars plana vitrectomy; VH Vitreous hemorrhage; PRP panretinal laser photocoagulation; IVK intravitreal kenalog; VMT vitreomacular traction; MH Macular hole;  NVD neovascularization of the disc; NVE neovascularization elsewhere; AREDS age related eye disease study; ARMD age related macular degeneration; POAG primary open angle glaucoma; EBMD epithelial/anterior basement membrane dystrophy; ACIOL anterior chamber intraocular lens; IOL intraocular lens; PCIOL posterior chamber intraocular lens; Phaco/IOL phacoemulsification with intraocular lens placement; [place] photorefractive keratectomy; LASIK laser assisted in situ keratomileusis; HTN hypertension; DM diabetes mellitus; COPD chronic obstructive pulmonary disease

## 2018-05-02 ENCOUNTER — Ambulatory Visit (INDEPENDENT_AMBULATORY_CARE_PROVIDER_SITE_OTHER): Payer: 59 | Admitting: Ophthalmology

## 2018-05-02 DIAGNOSIS — C6932 Malignant neoplasm of left choroid: Secondary | ICD-10-CM | POA: Diagnosis not present

## 2018-05-02 DIAGNOSIS — Z961 Presence of intraocular lens: Secondary | ICD-10-CM

## 2018-05-02 DIAGNOSIS — H3589 Other specified retinal disorders: Secondary | ICD-10-CM | POA: Diagnosis not present

## 2018-05-02 DIAGNOSIS — H2511 Age-related nuclear cataract, right eye: Secondary | ICD-10-CM | POA: Diagnosis not present

## 2018-05-02 DIAGNOSIS — H43812 Vitreous degeneration, left eye: Secondary | ICD-10-CM | POA: Diagnosis not present

## 2018-05-02 DIAGNOSIS — H35411 Lattice degeneration of retina, right eye: Secondary | ICD-10-CM

## 2018-05-02 DIAGNOSIS — H3581 Retinal edema: Secondary | ICD-10-CM | POA: Diagnosis not present

## 2018-05-02 DIAGNOSIS — T66XXXS Radiation sickness, unspecified, sequela: Secondary | ICD-10-CM

## 2018-05-03 ENCOUNTER — Encounter (INDEPENDENT_AMBULATORY_CARE_PROVIDER_SITE_OTHER): Payer: Self-pay | Admitting: Ophthalmology

## 2018-06-06 ENCOUNTER — Encounter: Payer: Self-pay | Admitting: Physician Assistant

## 2018-06-11 NOTE — Progress Notes (Signed)
Complete Physical  Assessment and Plan: Hypothyroidism, unspecified hypothyroidism type Hypothyroidism-check TSH level, continue medications the same, reminded to take on an empty stomach 30-87mins before food.  - TSH  Malignant neoplasm of choroid, left Will send labs to Dr. Rito Ehrlich, [place] 2014 - Hepatic function panel - DG Chest 2 View; Future - Gamma GT - CT Abdomen Pelvis W Contrast; Future  Vitamin D deficiency - Vit D  25 hydroxy (rtn osteoporosis monitoring)   Medication management - CBC with Differential/Platelet - BASIC METABOLIC PANEL WITH GFR - Magnesium  Anemia, unspecified anemia type - monitor, continue iron supp with Vitamin C and increase green leafy veggies   B12 deficiency - Vitamin B12   Corticosteroid-induced glaucoma, left, stage unspecified resolved  Psoriasis No rash at this time, monitor   Arthropathia Monitor, controlled for now  Routine general medical examination at a health care facility 1 year  Aortic atherosclerosis (HCC)  Hyperlipidemia, unspecified hyperlipidemia type -        Cardio IQ (R) Advanced Lipid Panel  Screening for diabetes mellitus -     Hemoglobin A1c   Discussed med's effects and SE's. Screening labs and tests as requested with regular follow-up as recommended.  HPI 47 y.o. female  presents for a complete physical. Her blood pressure has been controlled at home, today their BP is BP: 126/64 She does not workout. She denies chest pain, shortness of breath, dizziness.  She is not on cholesterol medication and denies myalgias. Her cholesterol is at goal. The cholesterol last visit was:  Lab Results  Component Value Date   CHOL 200 (H) 11/21/2017   HDL 49 (L) 11/21/2017   LDLCALC 126 (H) 11/21/2017   TRIG 130 11/21/2017   CHOLHDL 4.1 11/21/2017    Last A1C in the office was:  Lab Results  Component Value Date   HGBA1C 5.2 05/23/2017   Patient is on Vitamin D supplement, 10,000 but does not take daily Lab  Results  Component Value Date   VD25OH 24 (L) 05/23/2017   She has a B12 but does not take it regularly, takes sublingual takes 2 a day.  Lab Results  Component Value Date   [id number] 098 05/23/2017   She is on thyroid medication. Her medication was not changed last visit.   Lab Results  Component Value Date   TSH 2.26 11/21/2017  .  She was diagnosed with ocular melanoma, left eye in 2014 and has been seeing a specialist at [facility], Dr. Rito Ehrlich but now following with Dr. Daralene Milch, she had radiation and subsequent glaucoma BUT OFF THE EYE DROPS NOW, following with Dr. Celene Squibb.  Had cataract surgery in June.  She gets METS surveillance yearly and is due for CT AB with contrast (08/16) , LFTs/GTT q 6 months and CXR (04/2015).  DX code 190.6 Had Pap with Dr. Marvel Plan.  She is also seeing Dr. [place] every 8 months for skin checks.  BMI is Body mass index is 33.13 kg/m., she is working on diet and exercise. Wt Readings from Last 3 Encounters:  06/13/18 187 lb (84.8 kg)  11/21/17 182 lb (82.6 kg)  05/23/17 180 lb (81.6 kg)      Current Medications:  Current Outpatient Medications on File Prior to Visit  Medication Sig Dispense Refill  . carboxymethylcellulose (REFRESH PLUS) 0.5 % SOLN Apply to eye.    . cholecalciferol (VITAMIN D) 1000 UNITS tablet Take 2,000 Units by mouth daily.    . Cyanocobalamin (VITAMIN B-12 PO) Take 1 tablet by mouth  daily.    Marland Kitchen MELATONIN PO Take 1 tablet by mouth at bedtime.    Marland Kitchen SYNTHROID 112 MCG tablet TAKE 1 TABLET BY MOUTH DAILY BEFORE BREAKFAST. 90 tablet 1   No current facility-administered medications on file prior to visit.    Health Maintenance:   Immunization History  Administered Date(s) Administered  . Tdap 08/12/2014   TDAP: 2015 Pneumovax: N/A Prevnar 13: N/A Flu vaccine: 2014 Zostavax: N/A Pap: 2016 q 5 years MGM: 12/2014 gets at Dr. Marvel Plan DEXA: N/A Colonoscopy: EGD:  Medical History:  Past Medical History:  Diagnosis  Date  . Arthritis    hands and feet  . Cancer ([place])    ocular melanoma  . Hypothyroidism   . PONV (postoperative nausea and vomiting)    past surgery Scop patch and propofol helped  . Thyroid disease    Allergies Allergies  Allergen Reactions  . Oxycodone Nausea And Vomiting    Can take morphine and possibly Vicodin   SURGICAL HISTORY She  has a past surgical history that includes Wisdom tooth extraction; Eye surgery; and laparoscopy (N/A, 09/13/2013). FAMILY HISTORY Her family history includes Arthritis in her mother; Autoimmune disease in her father; Cancer in her maternal grandmother and mother; Cancer (age of onset: 15) in her maternal aunt; Heart disease in her maternal grandfather; Hyperlipidemia in her father; Nephrolithiasis in her father; Varicose Veins in her mother. SOCIAL HISTORY She  reports that she has never smoked. She has never used smokeless tobacco. She reports that she drinks alcohol. She reports that she does not use drugs.  Review of Systems  Constitutional: Negative.   HENT: Positive for congestion (allergies, on allegra). Negative for ear discharge, ear pain, hearing loss, nosebleeds, sore throat and tinnitus.   Eyes: Positive for blurred vision. Negative for double vision, photophobia, pain, discharge and redness.  Respiratory: Negative.  Negative for stridor.   Cardiovascular: Negative.   Gastrointestinal: Negative.   Genitourinary: Negative.   Musculoskeletal: Positive for joint pain (hands and feet occ). Negative for back pain, falls, myalgias and neck pain.  Skin: Negative.  Negative for itching and rash.  Neurological: Negative.  Negative for headaches.  Endo/Heme/Allergies: Negative.   Psychiatric/Behavioral: Negative.     Physical Exam: Estimated body mass index is 33.13 kg/m as calculated from the following:   Height as of this encounter: 5\' 3"  (1.6 m).   Weight as of this encounter: 187 lb (84.8 kg). BP 126/64   Pulse 74   Temp 97.9 F  (36.6 C)   Resp 16   Ht 5\' 3"  (1.6 m)   Wt 187 lb (84.8 kg)   LMP 06/09/2018   SpO2 98%   BMI 33.13 kg/m  General Appearance: Well nourished, in no apparent distress. Eyes: PERRLA, EOMs, left eye ptosis improved, conjunctiva no swelling or erythema Sinuses: No Frontal/maxillary tenderness ENT/Mouth: Ext aud canals clear, normal light reflex with TMs without erythema, bulging.  Good dentition. No erythema, swelling, or exudate on post pharynx. Tonsils not swollen or erythematous. Hearing normal.  Neck: Supple, thyroid normal. No bruits Respiratory: Respiratory effort normal, BS equal bilaterally without rales, rhonchi, wheezing or stridor. Cardio: RRR without murmurs, rubs or gallops. Brisk peripheral pulses without edema.  Chest: symmetric, with normal excursions and percussion. Breasts: defer Abdomen: Soft, +BS. Non tender, no guarding, rebound, hernias, masses, or organomegaly.  Lymphatics: Non tender without lymphadenopathy.  Genitourinary: defer Musculoskeletal: Full ROM all peripheral extremities,5/5 strength, no deformities, joint swelling, redness hands and feet, and normal gait. Skin:  Warm, dry without rashes, lesions, ecchymosis.  Neuro: Cranial nerves intact, reflexes equal bilaterally. Normal muscle tone, no cerebellar symptoms. Sensation intact.  Psych: Awake and oriented X 3, normal affect, Insight and Judgment appropriate.   EKG: declines   Vicie Mutters 3:08 PM

## 2018-06-13 ENCOUNTER — Encounter: Payer: Self-pay | Admitting: Physician Assistant

## 2018-06-13 ENCOUNTER — Ambulatory Visit (INDEPENDENT_AMBULATORY_CARE_PROVIDER_SITE_OTHER): Payer: 59 | Admitting: Physician Assistant

## 2018-06-13 VITALS — BP 126/64 | HR 74 | Temp 97.9°F | Resp 16 | Ht 63.0 in | Wt 187.0 lb

## 2018-06-13 DIAGNOSIS — E538 Deficiency of other specified B group vitamins: Secondary | ICD-10-CM

## 2018-06-13 DIAGNOSIS — Z79899 Other long term (current) drug therapy: Secondary | ICD-10-CM

## 2018-06-13 DIAGNOSIS — E785 Hyperlipidemia, unspecified: Secondary | ICD-10-CM

## 2018-06-13 DIAGNOSIS — D649 Anemia, unspecified: Secondary | ICD-10-CM

## 2018-06-13 DIAGNOSIS — H4060X0 Glaucoma secondary to drugs, unspecified eye, stage unspecified: Secondary | ICD-10-CM

## 2018-06-13 DIAGNOSIS — C6932 Malignant neoplasm of left choroid: Secondary | ICD-10-CM

## 2018-06-13 DIAGNOSIS — E890 Postprocedural hypothyroidism: Secondary | ICD-10-CM

## 2018-06-13 DIAGNOSIS — I700 Atherosclerosis of aorta: Secondary | ICD-10-CM

## 2018-06-13 DIAGNOSIS — Z0000 Encounter for general adult medical examination without abnormal findings: Secondary | ICD-10-CM

## 2018-06-13 DIAGNOSIS — Z131 Encounter for screening for diabetes mellitus: Secondary | ICD-10-CM | POA: Diagnosis not present

## 2018-06-13 DIAGNOSIS — T380X5A Adverse effect of glucocorticoids and synthetic analogues, initial encounter: Secondary | ICD-10-CM

## 2018-06-13 DIAGNOSIS — E559 Vitamin D deficiency, unspecified: Secondary | ICD-10-CM

## 2018-06-13 DIAGNOSIS — M129 Arthropathy, unspecified: Secondary | ICD-10-CM

## 2018-06-13 DIAGNOSIS — L409 Psoriasis, unspecified: Secondary | ICD-10-CM

## 2018-06-13 NOTE — Patient Instructions (Addendum)
Being dehydrated can hurt your kidneys, cause fatigue, headaches, muscle aches, joint pain, and dry skin/nails so please increase your fluids.   Drink 80-100 oz a day of water, measure it out! Eat 3 meals a day, have to do breakfast, eat protein- hard boiled eggs, protein bar like nature valley protein bar, greek yogurt like oikos triple zero, chobani 100, or light n fit greek  Can check out plantnanny app on your phone to help you keep track of your water    Check out  Mini habits for weight loss book  2 apps for tracking food is myfitness pal  loseit OR can take picture of your food   Intermittent fasting is more about strategy than starvation. It's meant to reset your body in different ways, hopefully with fitness and nutrition changes as a result.  Like any big switchover, though, results may vary when it comes down to the individual level. What works for your friends may not work for you, or vice versa. That's why it's helpful to play around with variations on intermittent fasting and healthy habits and find what works best for you.  WHAT IS INTERMITTENT FASTING AND WHY DO IT?  Intermittent fasting doesn't involve specific foods, but rather, a strict schedule regarding when you eat. Also called "time-restricted eating," the tactic has been praised for its contribution to weight loss, improved body composition, and decreased cravings. Preliminary research also suggests it may be beneficial for glucose tolerance, hormone regulation, better muscle mass and lower body fat.  Part of its appeal is the simplicity of the effort. Unlike some other trends, there's no calculations to intermittent fasting.  You simply eat within a certain block of time, usually a window of 8-10 hours. In the other big block of time - about 14-16 hours, including when you're asleep - you don't eat anything, not even snacks. You can drink water, coffee, tea or any other beverage that doesn't have calories.  For  example, if you like having a late dinner, you might skip breakfast and have your first meal at noon and your last meal of the day at 8 p.m., and then not eat until noon again the next day.  IDEAS FOR GETTING STARTED  If you're new to the strategy, it may be helpful to eat within the typical circadian rhythm and keep eating within daylight hours. This can be especially beneficial if you're looking at intermittent fasting for weight-loss goals.  So first try only eating between 12pm to 8pm.  Outside of this time you may have water, black coffee, and hot tea. You may not eat it drink anything that has carbs, sugars, OR artificial sugars like diet soda.   Like any major eating and fitness shift, it can take time to find the perfect fit, so don't be afraid to experiment with different options - including ditching intermittent fasting altogether if it's simply not for you. But if it is, you may be surprised by some of the benefits that come along with the strategy.      When it comes to diets, agreement about the perfect plan isn't easy to find, even among the experts. Experts at the [employer] developed an idea known as the Healthy Eating Plate. Just imagine a plate divided into logical, healthy portions.  The emphasis is on diet quality:  Load up on vegetables and fruits - one-half of your plate: Aim for color and variety, and remember that potatoes don't count.  Go for  whole grains - one-quarter of your plate: Whole wheat, barley, wheat berries, quinoa, oats, brown rice, and foods made with them. If you want pasta, go with whole wheat pasta.  Protein power - one-quarter of your plate: Fish, chicken, beans, and nuts are all healthy, versatile protein sources. Limit red meat.  The diet, however, does go beyond the plate, offering a few other suggestions.  Use healthy plant oils, such as olive, canola, soy, corn, sunflower and peanut. Check the labels, and avoid partially  hydrogenated oil, which have unhealthy trans fats.  If you're thirsty, drink water. Coffee and tea are good in moderation, but skip sugary drinks and limit milk and dairy products to one or two daily servings.  The type of carbohydrate in the diet is more important than the amount. Some sources of carbohydrates, such as vegetables, fruits, whole grains, and beans-are healthier than others.  Finally, stay active.

## 2018-06-14 LAB — CBC WITH DIFFERENTIAL/PLATELET
BASOS PCT: 0.6 %
Basophils Absolute: 38 cells/uL (ref 0–200)
EOS PCT: 0.8 %
Eosinophils Absolute: 51 cells/uL (ref 15–500)
HEMATOCRIT: 37.1 % (ref 35.0–45.0)
HEMOGLOBIN: 12.2 g/dL (ref 11.7–15.5)
LYMPHS ABS: 2138 {cells}/uL (ref 850–3900)
MCH: 28.2 pg (ref 27.0–33.0)
MCHC: 32.9 g/dL (ref 32.0–36.0)
MCV: 85.7 fL (ref 80.0–100.0)
MPV: 9.5 fL (ref 7.5–12.5)
Monocytes Relative: 5 %
NEUTROS ABS: 3853 {cells}/uL (ref 1500–7800)
Neutrophils Relative %: 60.2 %
Platelets: 276 10*3/uL (ref 140–400)
RBC: 4.33 10*6/uL (ref 3.80–5.10)
RDW: 13.1 % (ref 11.0–15.0)
Total Lymphocyte: 33.4 %
WBC: 6.4 10*3/uL (ref 3.8–10.8)
WBCMIX: 320 {cells}/uL (ref 200–950)

## 2018-06-14 LAB — COMPLETE METABOLIC PANEL WITH GFR
AG RATIO: 1.8 (calc) (ref 1.0–2.5)
ALT: 12 U/L (ref 6–29)
AST: 12 U/L (ref 10–35)
Albumin: 4.6 g/dL (ref 3.6–5.1)
Alkaline phosphatase (APISO): 96 U/L (ref 33–115)
BUN: 12 mg/dL (ref 7–25)
CALCIUM: 9.8 mg/dL (ref 8.6–10.2)
CO2: 23 mmol/L (ref 20–32)
CREATININE: 0.67 mg/dL (ref 0.50–1.10)
Chloride: 104 mmol/L (ref 98–110)
GFR, EST NON AFRICAN AMERICAN: 106 mL/min/{1.73_m2} (ref >=60)
GFR, Est African American: 123 mL/min/{1.73_m2} (ref >=60)
GLOBULIN: 2.5 g/dL (ref 1.9–3.7)
Glucose, Bld: 82 mg/dL (ref 65–99)
POTASSIUM: 3.9 mmol/L (ref 3.5–5.3)
SODIUM: 139 mmol/L (ref 135–146)
TOTAL PROTEIN: 7.1 g/dL (ref 6.1–8.1)
Total Bilirubin: 0.4 mg/dL (ref 0.2–1.2)

## 2018-06-14 LAB — VITAMIN B12: VITAMIN B 12: 533 pg/mL (ref 200–1100)

## 2018-06-14 LAB — GAMMA GT: GGT: 15 U/L (ref 3–55)

## 2018-06-14 LAB — HEMOGLOBIN A1C
EAG (MMOL/L): 6 (calc)
HEMOGLOBIN A1C: 5.4 %{Hb} (ref <5.7)
Mean Plasma Glucose: 108 (calc)

## 2018-06-14 LAB — MAGNESIUM: MAGNESIUM: 2 mg/dL (ref 1.5–2.5)

## 2018-06-14 LAB — TSH: TSH: 1.43 mIU/L

## 2018-06-14 LAB — IRON, TOTAL/TOTAL IRON BINDING CAP
%SAT: 22 % (ref 16–45)
Iron: 87 ug/dL (ref 40–190)
TIBC: 393 ug/dL (ref 250–450)

## 2018-06-14 LAB — VITAMIN D 25 HYDROXY (VIT D DEFICIENCY, FRACTURES): Vit D, 25-Hydroxy: 25 ng/mL — ABNORMAL LOW (ref 30–100)

## 2018-06-17 LAB — CARDIO IQ(R) ADVANCED LIPID PANEL
APOLIPOPROTEIN (CARDIO IQ ADV LIPID PANEL): 111 mg/dL — AB
Cholesterol: 208 mg/dL — ABNORMAL HIGH (ref <200)
HDL: 40 mg/dL — ABNORMAL LOW (ref >50)
LDL Cholesterol (Calc): 137 mg/dL (calc) — ABNORMAL HIGH (ref <100)
LDL LARGE: 5340 nmol/L (ref 3966–11938)
LDL MEDIUM: 504 nmol/L — AB (ref 122–498)
LDL PARTICLE NUMBER: 1980 nmol/L (ref 732–2035)
LDL PEAK SIZE: 213.2 Angstrom — AB (ref >=217)
LDL Small: 508 nmol/L — ABNORMAL HIGH (ref 75–452)
Lipoprotein (a): 10 nmol/L (ref <75)
NON-HDL CHOLESTEROL (CALC): 168 mg/dL — AB (ref <130)
Total CHOL/HDL Ratio: 5.2 calc — ABNORMAL HIGH (ref <5.0)
Triglycerides: 175 mg/dL — ABNORMAL HIGH (ref <150)

## 2018-06-18 NOTE — Progress Notes (Signed)
[facility] Note  06/19/2018     CHIEF COMPLAINT Patient presents for Retina Follow Up   HISTORY OF PRESENT ILLNESS: Tracy Kidd is a 47 y.o. female who presents to the clinic today for:   HPI    Retina Follow Up    Patient presents with  Other.  In left eye.  Severity is moderate.  Duration of 6 weeks.  Since onset it is stable.  I, the attending physician,  performed the HPI with the patient and updated documentation appropriately.          Comments    Pt presents for malignant melanoma OS, pt states her vision seems the same as last time, she state the flashes have gotten less, she denies floaters or pain, pt denies the use of gtts       Last edited by Bernarda Caffey, MD on 06/19/2018  3:45 PM. (History)      Referring physician: Unk Pinto, MD [place], [place] [address]  HISTORICAL INFORMATION:   Selected notes from the [facility] Referred by Dr. Albina Billet for cataract surery clearance OS;  LEE- 03.01.19 (C. 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 carboxymethylcellulose (REFRESH PLUS) 0.5 % SOLN Apply to eye.   No current facility-administered medications for this visit.  (Ophthalmic Drugs)   Current Outpatient Medications (Other)  Medication Sig  . Cetirizine HCl (ZYRTEC ALLERGY) 10 MG CAPS Zyrtec  . cholecalciferol (VITAMIN D) 1000 UNITS tablet Take 2,000 Units by mouth daily.  . Cholecalciferol (VITAMIN D3) 1000 UNIT/SPRAY LIQD Vitamin D3  . Cyanocobalamin (VITAMIN B-12 PO) Take 1 tablet by mouth daily.  Marland Kitchen MELATONIN PO Take 1 tablet by mouth at bedtime.  Marland Kitchen SYNTHROID 112 MCG tablet TAKE 1 TABLET BY MOUTH DAILY BEFORE  BREAKFAST.  Marland Kitchen vitamin B-12 (CYANOCOBALAMIN) 100 MCG tablet Vitamin B12   No current facility-administered medications for this visit.  (Other)      REVIEW OF SYSTEMS: ROS    Positive for: Eyes   Negative for: Constitutional, Gastrointestinal, Neurological, Skin, Genitourinary, Musculoskeletal, HENT, Endocrine, Cardiovascular, Respiratory, Psychiatric, Allergic/Imm, Heme/Lymph   Last edited by Debbrah Alar, COT on 06/19/2018  3:13 PM. (History)       ALLERGIES Allergies  Allergen Reactions  . Oxycodone Nausea And Vomiting    Can take morphine and possibly Vicodin    PAST MEDICAL HISTORY Past Medical History:  Diagnosis Date  . Arthritis    hands and feet  . Cancer ([place])    ocular melanoma  . Hypothyroidism   . PONV (postoperative nausea and vomiting)    past surgery Scop patch and propofol helped  . Thyroid disease    Past Surgical History:  Procedure Laterality Date  . EYE SURGERY     radiation plaque placement and removal  . LAPAROSCOPY N/A 09/13/2013   Procedure: LAPAROSCOPY OPERATIVE, removal of pelvic mass;  Surgeon: Logan Bores, MD;  Location: [facility];  Service: Gynecology;  Laterality: N/A;  . Kidd TOOTH EXTRACTION      FAMILY HISTORY Family History  Problem Relation Age of Onset  . Varicose Veins Mother   . Arthritis Mother   . Cancer Mother  Melanoma  . Nephrolithiasis Father   . Hyperlipidemia Father   . Autoimmune disease Father        atrophic gastritis/B12 def  . Cancer Maternal Grandmother        Lung  . Heart disease Maternal Grandfather        CHF  . Cancer Maternal Aunt 44       melanoma    SOCIAL HISTORY Social History   Tobacco Use  . Smoking status: Never Smoker  . Smokeless tobacco: Never Used  Substance Use Topics  . Alcohol use: Yes    Comment: rare  . Drug use: No         OPHTHALMIC EXAM:  Base Eye Exam    Visual Acuity (Snellen - Linear)      Right Left   Dist cc 20/25 -1 20/40 -2   Dist ph cc  20/25 +2 NI   Correction:  Glasses       Tonometry (Tonopen, 3:20 PM)      Right Left   Pressure 16 17       Pupils      Dark Light Shape React APD   Right 4 2 Round Brisk None   Left 4 2 Round Brisk None       Visual Fields      Left Right     Full   Restrictions Partial outer inferior nasal deficiency        Extraocular Movement      Right Left    Full, Ortho Full, Ortho       Neuro/Psych    Oriented x3:  Yes   Mood/Affect:  Normal       Dilation    Left eye:  1.0% Mydriacyl, 2.5% Phenylephrine @ 3:20 PM        Slit Lamp and Fundus Exam    Slit Lamp Exam      Right Left   Lids/Lashes Dermatochalasis - upper lid Dermatochalasis - upper lid, sparse lashes nasal UL, mild  Telangiectasia   Conjunctiva/Sclera White and quiet White and quiet   Cornea mild focal K haze superiorly punctate sub-epithelial opacities, trace Punctate epithelial erosions   Anterior Chamber Deep and quiet Deep and quiet   Iris Round and dilated Round and dilated to 5.13mm   Lens 1+ Nuclear sclerosis, 1+ Cortical cataract PC IOL in good position, 1+ Posterior capsular opacification   Vitreous Mild Vitreous syneresis Vitreous syneresis, +pigment in anterior vit, Posterior vitreous detachment       Fundus Exam      Right Left   Disc  sharp   C/D Ratio 0.35 0.55   Macula  Flat, Retinal pigment epithelial mottling and atrophy   Vessels  Superior Vascular attenuation overlying melanoma   Periphery  Attached, regressed choroidal melanoma off superior disc, + edema overlying melanoma but no heme, large round area of chorioretinal atrophy at 1200 anterior to melanoma, pigmented Chorioretinal scars at 0130, 0200, pigmented chorioretinal atrophy spanning from 0300 to 0400, peripheral cystoid degeneration and VR tufts at 0900 ora          IMAGING AND PROCEDURES  Imaging and Procedures for 12/05/17  OCT, Retina - OU - Both Eyes       Right Eye Quality was good. Central Foveal Thickness: 256.  Progression has been stable. Findings include normal foveal contour, no IRF, no SRF.   Left Eye Quality was good. Central Foveal Thickness: 242. Progression has been stable. Findings include normal foveal contour, outer retinal  atrophy, intraretinal fluid, no SRF (Intraretinal fluid superiorly, diffuse outer retinal atrophy).   Notes *Images captured and stored on drive  Diagnosis / Impression:  OD: NFP, No IRF/SRF OS: NFP, IRF/edema superior macula, diffuse ORA -- stable from prior  Clinical management:  See below  Abbreviations: NFP - Normal foveal profile. CME - cystoid macular edema. PED - pigment epithelial detachment. IRF - intraretinal fluid. SRF - subretinal fluid. EZ - ellipsoid zone. ERM - epiretinal membrane. ORA - outer retinal atrophy. ORT - outer retinal tubulation. SRHM - subretinal hyper-reflective material                  ASSESSMENT/PLAN:    ICD-10-CM   1. 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 Post-radiation retinopathy, sequela T66.XXXS    H35.89   3. Retinal edema H35.81 OCT, Retina - OU - Both Eyes  4. Posterior vitreous detachment of left eye H43.812   5. 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 Nuclear sclerosis, right H25.11   7. Pseudophakia of left eye Z96.1     1,2,3. 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 Boyd Kerbs - fundus exam shows regressed choroidal melanoma -- no heme or SRF -- stable from prior - OCT today shows stable retinal edema in superior macula - BCVA remains 20/40 - continue monitoring - F/U 1 year  4. PVD / vitreous syneresis OS  Intermittent FOL OS temporal visual field -- inc frequency following cataract  surgery -- now frequency has decreased since last visit   No RT or RD on 360 scleral depressed exam, stable VR tufts at nasal ora  Discussed findings and prognosis  Reviewed s/s of RT/RD  Strict return precautions for any such RT/RD signs/symptoms  5. Lattice degeneration, OD - s/p laser retinopexy with Dr. Zigmund Daniel - good laser in place - stable - no lattice OS - monitor  6. Pseudophakia OS  - s/p CE/IOL OS by expert surgeon Dr. Ellie Lunch  - beautiful surgery, doing well  - monitor  7. Nuclear sclerosis, OD - The symptoms of cataract, surgical options, and treatments and risks were discussed with patient. - discussed diagnosis and progression - not yet visually significant - monitor for now    Ophthalmic Meds Ordered this visit:  No orders of the defined types were placed in this encounter.      Return in about 1 year (around 06/20/2019) for F/U Hx chor melanoma OS, DFE, OCT.  There are no Patient Instructions on file for this visit.   Explained the diagnoses, plan, and follow up with the patient and they expressed understanding.  Patient expressed understanding of the importance of proper follow up care.   This document serves as a record of services personally performed by Gardiner Sleeper, MD, PhD. It was created on their behalf by Ernest Mallick, OA, an ophthalmic assistant. The creation of this record is the provider's dictation and/or activities during the visit.    Electronically signed by: Ernest Mallick, OA  09.23.19 11:00 PM   This document serves as a record of services personally performed by Gardiner Sleeper, MD, PhD. It was created on their behalf by Catha Brow, [place], a certified  ophthalmic assistant. The creation of this record is the provider's dictation and/or activities during the visit.  Electronically signed by: Catha Brow, [place]  09.24.19 11:00 PM   Gardiner Sleeper, M.D., Ph.D. Diseases & Surgery of the Retina and Vitreous [facility]   I have reviewed the above documentation for accuracy and completeness, and I agree with the above. Gardiner Sleeper, M.D., Ph.D. 06/20/18 11:00 PM     Abbreviations: M myopia (nearsighted); A astigmatism; H hyperopia (farsighted); P presbyopia; Mrx spectacle prescription;  CTL contact lenses; OD right eye; OS left eye; OU both eyes  XT exotropia; ET esotropia; PEK punctate epithelial keratitis; PEE punctate epithelial erosions; DES dry eye syndrome; MGD meibomian gland dysfunction; ATs artificial tears; PFAT's preservative free artificial tears; [place] nuclear sclerotic cataract; PSC posterior subcapsular cataract; ERM epi-retinal membrane; PVD posterior vitreous detachment; RD retinal detachment; DM diabetes mellitus; DR diabetic retinopathy; NPDR non-proliferative diabetic retinopathy; PDR proliferative diabetic retinopathy; CSME clinically significant macular edema; DME diabetic macular edema; dbh dot blot hemorrhages; CWS cotton wool spot; POAG primary open angle glaucoma; C/D cup-to-disc ratio; HVF humphrey visual field; GVF goldmann visual field; OCT optical coherence tomography; IOP intraocular pressure; BRVO Branch retinal vein occlusion; CRVO central retinal vein occlusion; CRAO central retinal artery occlusion; BRAO branch retinal artery occlusion; RT retinal tear; SB scleral buckle; PPV pars plana vitrectomy; VH Vitreous hemorrhage; PRP panretinal laser photocoagulation; IVK intravitreal kenalog; VMT vitreomacular traction; MH Macular hole;  NVD neovascularization of the disc; NVE neovascularization elsewhere; AREDS age related eye disease study; ARMD age related macular degeneration; POAG primary open angle glaucoma; EBMD epithelial/anterior basement membrane dystrophy; ACIOL anterior chamber intraocular lens; IOL intraocular lens; PCIOL posterior chamber intraocular lens; Phaco/IOL phacoemulsification with intraocular lens placement; [place] photorefractive keratectomy; LASIK laser  assisted in situ keratomileusis; HTN hypertension; DM diabetes mellitus; COPD chronic obstructive pulmonary disease

## 2018-06-19 ENCOUNTER — Ambulatory Visit (INDEPENDENT_AMBULATORY_CARE_PROVIDER_SITE_OTHER): Payer: 59 | Admitting: Ophthalmology

## 2018-06-19 ENCOUNTER — Encounter (INDEPENDENT_AMBULATORY_CARE_PROVIDER_SITE_OTHER): Payer: Self-pay | Admitting: Ophthalmology

## 2018-06-19 DIAGNOSIS — H3581 Retinal edema: Secondary | ICD-10-CM

## 2018-06-19 DIAGNOSIS — H3589 Other specified retinal disorders: Secondary | ICD-10-CM | POA: Diagnosis not present

## 2018-06-19 DIAGNOSIS — H43812 Vitreous degeneration, left eye: Secondary | ICD-10-CM | POA: Diagnosis not present

## 2018-06-19 DIAGNOSIS — H2511 Age-related nuclear cataract, right eye: Secondary | ICD-10-CM | POA: Diagnosis not present

## 2018-06-19 DIAGNOSIS — Z961 Presence of intraocular lens: Secondary | ICD-10-CM | POA: Diagnosis not present

## 2018-06-19 DIAGNOSIS — H35411 Lattice degeneration of retina, right eye: Secondary | ICD-10-CM | POA: Diagnosis not present

## 2018-06-19 DIAGNOSIS — C6932 Malignant neoplasm of left choroid: Secondary | ICD-10-CM | POA: Diagnosis not present

## 2018-06-19 DIAGNOSIS — T66XXXS Radiation sickness, unspecified, sequela: Secondary | ICD-10-CM

## 2018-06-20 ENCOUNTER — Encounter (INDEPENDENT_AMBULATORY_CARE_PROVIDER_SITE_OTHER): Payer: Self-pay | Admitting: Ophthalmology

## 2018-06-20 ENCOUNTER — Other Ambulatory Visit: Payer: Self-pay | Admitting: Physician Assistant

## 2018-07-02 ENCOUNTER — Ambulatory Visit
Admission: RE | Admit: 2018-07-02 | Discharge: 2018-07-02 | Disposition: A | Discharge status: Home / Self Care | Payer: 59 | Source: Ambulatory Visit | Attending: Physician Assistant | Admitting: Physician Assistant

## 2018-07-02 DIAGNOSIS — C6932 Malignant neoplasm of left choroid: Secondary | ICD-10-CM

## 2018-07-02 DIAGNOSIS — D259 Leiomyoma of uterus, unspecified: Secondary | ICD-10-CM | POA: Diagnosis not present

## 2018-07-02 MED ORDER — IOPAMIDOL (ISOVUE-300) INJECTION 61%
100.0000 mL | Freq: Once | INTRAVENOUS | Status: AC | PRN
  Administered 2018-07-02: 100 mL via INTRAVENOUS

## 2018-07-03 DIAGNOSIS — C6932 Malignant neoplasm of left choroid: Secondary | ICD-10-CM | POA: Diagnosis not present

## 2018-07-05 ENCOUNTER — Other Ambulatory Visit: Payer: Self-pay | Admitting: Adult Health

## 2018-07-05 MED FILL — SYNTHROID 112 MCG TABLET: 112 | 90 days supply | Qty: 90 | Fill #0

## 2018-08-09 ENCOUNTER — Ambulatory Visit (HOSPITAL_COMMUNITY)
Admission: RE | Admit: 2018-08-09 | Discharge: 2018-08-09 | Disposition: A | Discharge status: Home / Self Care | Payer: 59 | Source: Ambulatory Visit | Attending: Physician Assistant | Admitting: Physician Assistant

## 2018-08-09 DIAGNOSIS — C6932 Malignant neoplasm of left choroid: Secondary | ICD-10-CM | POA: Insufficient documentation

## 2018-08-09 DIAGNOSIS — R0989 Other specified symptoms and signs involving the circulatory and respiratory systems: Secondary | ICD-10-CM | POA: Diagnosis not present

## 2018-10-03 DIAGNOSIS — H35413 Lattice degeneration of retina, bilateral: Secondary | ICD-10-CM | POA: Diagnosis not present

## 2018-10-04 MED FILL — SYNTHROID 112 MCG TABLET: 112 | 90 days supply | Qty: 90 | Fill #1

## 2018-12-21 ENCOUNTER — Other Ambulatory Visit: Payer: Self-pay | Admitting: Adult Health

## 2018-12-21 MED FILL — SYNTHROID 112 MCG TABLET: 112 | 90 days supply | Qty: 90 | Fill #0

## 2019-03-05 DIAGNOSIS — C6932 Malignant neoplasm of left choroid: Secondary | ICD-10-CM | POA: Diagnosis not present

## 2019-03-12 ENCOUNTER — Other Ambulatory Visit: Payer: Self-pay

## 2019-03-12 ENCOUNTER — Ambulatory Visit: Payer: 59 | Admitting: Adult Health Nurse Practitioner

## 2019-03-12 ENCOUNTER — Encounter: Payer: Self-pay | Admitting: Adult Health Nurse Practitioner

## 2019-03-12 VITALS — Temp 98.5°F

## 2019-03-12 DIAGNOSIS — J301 Allergic rhinitis due to pollen: Secondary | ICD-10-CM | POA: Diagnosis not present

## 2019-03-12 DIAGNOSIS — R059 Cough, unspecified: Secondary | ICD-10-CM

## 2019-03-12 DIAGNOSIS — R05 Cough: Secondary | ICD-10-CM

## 2019-03-12 DIAGNOSIS — Z7189 Other specified counseling: Secondary | ICD-10-CM | POA: Diagnosis not present

## 2019-03-12 DIAGNOSIS — H9203 Otalgia, bilateral: Secondary | ICD-10-CM

## 2019-03-12 NOTE — Progress Notes (Signed)
Assessment and Plan:  Tracy Kidd was seen today for cough.  Diagnoses and all orders for this visit:  Allergic rhinitis due to pollen, unspecified seasonality Discussed changing antihistamines, OTC xyzal nightly Discussed adding claritin or allegra if symptoms persist Discussed nasal spray or Neti Pot, reports not able to tolerate anything up her nose. Discussed vasaline, apply with cotton tip applicator to moisten mucosa  Cough Productive, likely from allergies Continue mucinex PRN Should resolve with allergy symptoms control Dicussed OTC Delsym if symptoms persist  Otalgia of both ears Has improve since appointment was made Continue to monitor Should improve with reduction of allergy symptoms  Advice Given About Covid-19 Virus by Telephone Concerned about COVID19, onset has been gradual    Follow Up Instructions  Discussed assessment and treatment plan with the patient. The patient was provided an opportunity to ask questions and all were answered. The patient agrees with the plan of care and demonstrates an understanding of the instructions.   The patient was advised to call back or seek an in-person evaluation if the symptoms worsen or if the condition fails to improve as anticipated.   I provided 20 minutes of non-face-to-face time during this encounter including interview, counseling, chart review, and critical decision making was preformed.     Future Appointments  Date Time Provider [place]  03/12/2019  3:45 PM Garnet Sierras, NP [facility] None  06/19/2019  3:00 PM Vicie Mutters, PA-C [facility] None    ------------------------------------------------------------------------------------------------------------------   HPI Tracy Kidd presents for evaluation for scratchy sore throat that started one week ago that was gradual with onset..  Tracy Kidd has been having and increase in productive cough the next day with low grade fever.  Tracy Kidd had nasal  congestion and ear pain that improved after getting up.  Tracy Kidd has taken mucinex Max with associated productive coughing.  Friday Tracy Kidd had swollen lymph and increase in coughing with fever and fatigue.  Sunday her symptoms persisted with increasing sinus pressure and right ear felt clogged.  Yesterday Tracy Kidd had instance of bloody nose and continued to feel tired and fatigued. Today no achy joints, but still have productive cough.  Reports that Tracy Kidd will get a tickle in the back of her throat that causes coughing.  Tracy Kidd denies myalgias, amnosia, headaches, change in vision, shortness of breath or chest pains.   Past Medical History:  Diagnosis Date  . Arthritis    hands and feet  . Cancer ([place])    ocular melanoma  . Hypothyroidism   . PONV (postoperative nausea and vomiting)    past surgery Scop patch and propofol helped  . Thyroid disease      Allergies  Allergen Reactions  . Oxycodone Nausea And Vomiting    Can take morphine and possibly Vicodin    Current Outpatient Medications on File Prior to Visit  Medication Sig  . carboxymethylcellulose (REFRESH PLUS) 0.5 % SOLN Apply to eye.  . Cetirizine HCl (ZYRTEC ALLERGY) 10 MG CAPS Zyrtec  . cholecalciferol (VITAMIN D) 1000 UNITS tablet Take 2,000 Units by mouth daily.  . Cholecalciferol (VITAMIN D3) 1000 UNIT/SPRAY LIQD Vitamin D3  . Cyanocobalamin (VITAMIN B-12 PO) Take 1 tablet by mouth daily.  Marland Kitchen MELATONIN PO Take 1 tablet by mouth at bedtime.  Marland Kitchen SYNTHROID 112 MCG tablet TAKE 1 TABLET BY MOUTH DAILY BEFORE BREAKFAST.  Marland Kitchen vitamin B-12 (CYANOCOBALAMIN) 100 MCG tablet Vitamin B12   No current facility-administered medications on file prior to visit.     ROS: all negative except above.  Physical Exam:  Tracy Kidd does not have supplies to check blood pressure.  General : Well sounding patient in no apparent distress HEENT: no hoarseness, no cough for duration of visit Lungs: speaks in complete sentences, no audible wheezing, no apparent  distress Neurological: alert, oriented x 3 Psychiatric: pleasant, judgement appropriate   Garnet Sierras, NP 4:15PM [facility]

## 2019-03-15 NOTE — Patient Instructions (Addendum)
Here is a list of antihistamines to try:  Start with Xyzal, if it makes you sleepy take at night. your drivers license to purchase this   Choose an antihistamine to help dry up nasal drainage.  Zyrtec / Cetirizine Take 10mg  by mouth May cause drowsiness, take nightly Be sure to drink plenty of water If this is not effective, try Xyzal OR Allegra  OR  Xyzal / Levocetirazine  Take 5mg  by mouth May cause drowsiness, take nightly Be sure to drink plenty of water If this is not effective try Allegra OR Zyrtec  OR  Allegra / fexofenadine Take 180mg  by mouth If this is not effective try Zyrtec OR Xyzal   *If you battle with chronic allergies you may need to change the antihistamine you currently use to find most effective.  You can add Claritin 10mg  daily OR singular as a second agent to one in the above category.  Cough If you continue to have a cough at night try some Delsym cough syrup. You can get this at any local pharmacy.  Ibuprofen / Advil / Motrin - Will also help with inflammation  600mg  every six hours OR 800mg  every 8 hours If you have this at home each tablet is 200mg .  For Sinus Pain:  Tylenol Extra Strength 500mg  Take 1-2 tablets every 8 hours as needed for fever or pain Do not take more than 4,000mg  to tylenol in 24 hours  May rotate between Tylenol and ibuprofen.  They are different.    Please contact us if you have any new or worsening symptoms  Here is some general information about COVID19     COVID-19 Labs  No results for input(s): DDIMER, FERRITIN, LDH, CRP in the last 72 hours.  No results found for: SARSCOV2NAA  Coronavirus (COVID-19) Are you at risk?  Are you at risk for the Coronavirus (COVID-19)?  To be considered HIGH RISK for Coronavirus (COVID-19), you have to meet the following criteria:  . Traveled to [place], [place], [place], [place] or [place]; or in the [place] to [place], [place], [place], or [place]; and  have fever, cough, and shortness of breath within the last 2 weeks of travel OR . Been in close contact with a person diagnosed with COVID-19 within the last 2 weeks and have fever, cough, and shortness of breath . IF YOU DO NOT MEET THESE CRITERIA, YOU ARE CONSIDERED LOW RISK FOR COVID-19.  What to do if you are HIGH RISK for COVID-19?  Marland Kitchen If you are having a medical emergency, call 911. . Seek medical care right away. Before you go to a doctor's office, urgent care or emergency department, call ahead and tell them about your recent travel, contact with someone diagnosed with COVID-19, and your symptoms. You should receive instructions from your physician's office regarding next steps of care.  . When you arrive at healthcare provider, tell the healthcare staff immediately you have returned from visiting [place], [place], [place], [place] or [place]; or traveled in the [place] to [place], [place], [place], or [place]; in the last two weeks or you have been in close contact with a person diagnosed with COVID-19 in the last 2 weeks.   . Tell the health care staff about your symptoms: fever, cough and shortness of breath. . After you have been seen by a medical provider, you will be either: o Tested for (COVID-19) and discharged home on quarantine except to seek medical care if symptoms worsen,  and asked to  - Stay home and avoid contact with others until you get your results (4-5 days)  - Avoid travel on public transportation if possible (such as bus, train, or airplane) or o Sent to the Emergency Department by EMS for evaluation, COVID-19 testing, and possible admission depending on your condition and test results.  What to do if you are LOW RISK for COVID-19?  Reduce your risk of any infection by using the same precautions used for avoiding the common cold or flu:  Marland Kitchen Wash your hands often with soap and warm water for at least 20 seconds.  If soap and water are not readily available,  use an alcohol-based hand sanitizer with at least 60% alcohol.  . If coughing or sneezing, cover your mouth and nose by coughing or sneezing into the elbow areas of your shirt or coat, into a tissue or into your sleeve (not your hands). . Avoid shaking hands with others and consider head nods or verbal greetings only. . Avoid touching your eyes, nose, or mouth with unwashed hands.  . Avoid close contact with people who are sick. . Avoid places or events with large numbers of people in one location, like concerts or sporting events. . Carefully consider travel plans you have or are making. . If you are planning any travel outside or inside the [place], visit the CDC's Travelers' Health webpage for the latest health notices. . If you have some symptoms but not all symptoms, continue to monitor at home and seek medical attention if your symptoms worsen. . If you are having a medical emergency, call 911.   [place] / e-Visit: [email or web address]         [facility]: [facility]: [phone]                   [facility]: [phone]

## 2019-04-02 MED FILL — SYNTHROID 112 MCG TABLET: 112 | 90 days supply | Qty: 90 | Fill #0

## 2019-04-03 DIAGNOSIS — H524 Presbyopia: Secondary | ICD-10-CM | POA: Diagnosis not present

## 2019-04-03 DIAGNOSIS — H04221 Epiphora due to insufficient drainage, right lacrimal gland: Secondary | ICD-10-CM | POA: Diagnosis not present

## 2019-04-09 ENCOUNTER — Telehealth: Payer: Self-pay

## 2019-04-09 ENCOUNTER — Other Ambulatory Visit: Payer: 59

## 2019-04-09 ENCOUNTER — Ambulatory Visit: Payer: 59 | Admitting: Adult Health Nurse Practitioner

## 2019-04-09 ENCOUNTER — Encounter: Payer: Self-pay | Admitting: Adult Health Nurse Practitioner

## 2019-04-09 ENCOUNTER — Other Ambulatory Visit: Payer: Self-pay

## 2019-04-09 VITALS — Temp 98.9°F | Ht 63.0 in

## 2019-04-09 DIAGNOSIS — R52 Pain, unspecified: Secondary | ICD-10-CM

## 2019-04-09 DIAGNOSIS — J301 Allergic rhinitis due to pollen: Secondary | ICD-10-CM | POA: Diagnosis not present

## 2019-04-09 DIAGNOSIS — J324 Chronic pansinusitis: Secondary | ICD-10-CM | POA: Diagnosis not present

## 2019-04-09 DIAGNOSIS — Z20822 Contact with and (suspected) exposure to covid-19: Secondary | ICD-10-CM

## 2019-04-09 MED ORDER — AZITHROMYCIN 250 MG PO TABS: ORAL_TABLET | ORAL | 1 refills | Status: DC

## 2019-04-09 MED ORDER — PREDNISONE 10 MG (21) PO TBPK: ORAL_TABLET | Freq: Every day | ORAL | 0 refills | Status: DC

## 2019-04-09 MED FILL — AZITHROMYCIN 250 MG TABLET: 250 | 5 days supply | Qty: 6 | Fill #0

## 2019-04-09 MED FILL — predniSONE 10 MG TABS: 10 | 6 days supply | Qty: 21 | Fill #0

## 2019-04-09 NOTE — Telephone Encounter (Signed)
Pt has an appt today 04/09/2019 at 345 pm across from [facility]

## 2019-04-09 NOTE — Progress Notes (Signed)
Virtual Visit via Telephone Note  I connected with Tracy Kidd on 04/11/19 at 11:30 AM EDT by telephone and verified that I am speaking with the correct person using two identifiers.   I discussed the limitations, risks, security and privacy concerns of performing an evaluation and management service by telephone and the availability of in person appointments. I also discussed with the patient that there may be a patient responsible charge related to this service. The patient expressed understanding and agreed to proceed.   Assessment and Plan:  Tracy Kidd was seen today for acute visit, sinus problem and fever.  Diagnoses and all orders for this visit: Previously concerned of COVID symptoms last visit.  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  Generalized body aches May use tylenol 1,000mg , TID OR Ibuprofen 600mg  every 6 hours for pain/inflammation.  Take this with food  -Sent for COVID testing/resulted Negative.   Pansinusitis, unspecified chronicity -     azithromycin (ZITHROMAX) 250 MG tablet; Take 2 tablets (500 mg) on  Day 1,  followed by 1 tablet (250 mg) once daily on Days 2 through 5. -     predniSONE (STERAPRED UNI-PAK 21 TAB) 10 MG (21) TBPK tablet; Take by mouth daily. Follow directions on Taper pack May continue Mucinex Sinus Max if effective with symptoms  Allergic rhinitis due to pollen, unspecified seasonality Continue xyzal Discussed switching if not effective    Follow Up Instructions:  Discussed assessment and treatment plan with the patient. The patient was provided an opportunity to ask questions and all were answered. The patient agrees with the plan of care and demonstrates an understanding of the instructions.   The patient was advised to call back or seek an in-person evaluation if the symptoms worsen or if the condition fails to improve as anticipated. I provided 20 minutes of non-face-to-face time during  this encounter including interview, counseling, chart review, and critical decision making was preformed.   Future Appointments  Date Time Provider [place]  06/19/2019  3:00 PM Vicie Mutters, PA-C [facility] None     ------------------------------------------------------------------------------------------------------------------   HPI 48 y.o.female presents for evaluation of sinus symptoms.  She was last seen on 03/10/19 for allergic rhinitis.  Reports her symptoms resolved after two weeks.  She had decreased symptoms for one week.  Then two days ago she started having myalgias and joint pains.  She had facial pressure, eye pressure, ear pressure with popping and cracking.  She has taking Mucinex Sinus max, that has helped some.   Denies any runny nose, she is taking xyzal to help with this.  She works in the [employer] and is not in close quarters and distancing at work as well as masking.  Her husband also works in [employer] treasure[employer].  Past Medical History:  Diagnosis Date  . Arthritis    hands and feet  . 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 Hypothyroidism   . PONV (postoperative nausea and vomiting)    past surgery Scop patch and propofol helped  . Thyroid disease      Allergies  Allergen Reactions  . Oxycodone Nausea And Vomiting    Can take morphine and possibly Vicodin    Current Outpatient Medications on File Prior to Visit  Medication Sig  . carboxymethylcellulose (REFRESH PLUS) 0.5 % SOLN Apply to eye.  . Cetirizine HCl (ZYRTEC ALLERGY) 10 MG CAPS Zyrtec  . cholecalciferol (VITAMIN D) 1000 UNITS tablet Take  2,000 Units by mouth daily.  . Cyanocobalamin (VITAMIN B-12 PO) Take 1 tablet by mouth daily.  . Ferrous Sulfate (IRON) 28 MG TABS Take by mouth daily.  Marland Kitchen KRILL OIL PO Take by mouth daily.  Marland Kitchen levocetirizine (XYZAL) 5 MG tablet Take 5 mg by mouth every evening.  Marland Kitchen MELATONIN PO Take 1 tablet by mouth at bedtime.  Marland Kitchen SYNTHROID 112 MCG tablet TAKE 1 TABLET BY  MOUTH DAILY BEFORE BREAKFAST.  Marland Kitchen TURMERIC PO Take by mouth daily.  . vitamin B-12 (CYANOCOBALAMIN) 100 MCG tablet Vitamin B12  . Cholecalciferol (VITAMIN D3) 1000 UNIT/SPRAY LIQD Vitamin D3   No current facility-administered medications on file prior to visit.     ROS: all negative except above.   Physical Exam:  Temp 98.9 F (37.2 C)   Ht 5\' 3"  (1.6 m)   BMI 33.13 kg/m  General : Well sounding patient in no apparent distress HEENT: no hoarseness, no cough for duration of visit Lungs: speaks in complete sentences, no audible wheezing, no apparent distress Neurological: alert, oriented x 3 Psychiatric: pleasant, judgement appropriate    Garnet Sierras, NP 12:27 PM [facility]

## 2019-04-09 NOTE — Telephone Encounter (Addendum)
Patient called on cell and home number listed, left VM to return the call to [phone] between 0700-1900 Monday-Friday to schedule covid testing. Order placed.    ----- Message from Garnet Sierras, NP sent at 04/09/2019 10:27 AM EDT ----- Regarding: Tracy Kidd September 02, 1971 - COVID19 testing symptomatic Please contact for scheduling of COVID19 testing due to symptoms: fever, body aches.  Thank you for your attention to this matter.   Sincerely,  Garnet Sierras, NP   [facility] Office # [phone] Mobile # [phone]

## 2019-06-01 IMAGING — CT CT ABD-PELV W/ CM
2 of 5 series · 14 of 46 positions shown, 16 images · IV contrast (iopamidol)
Comparison: Multiple prior CT scans.  The most recent is 05/31/2017

CLINICAL DATA: History of left ocular melanoma in 8224.

EXAM:
CT ABDOMEN AND PELVIS WITH CONTRAST
TECHNIQUE: Multidetector CT imaging of the abdomen and pelvis was performed
using the standard protocol following bolus administration of
intravenous contrast.
CONTRAST:  100mL 65PM1M-BGG IOPAMIDOL (65PM1M-BGG) INJECTION 61%

[Series 2: abd pelvis 5.00 br40 s3 ax · axial · 0.69mm/px · z∈[+1318,+1743]mm · 11 of 97 slices shown, 13 images]
[im 6/97  soft-tissue]
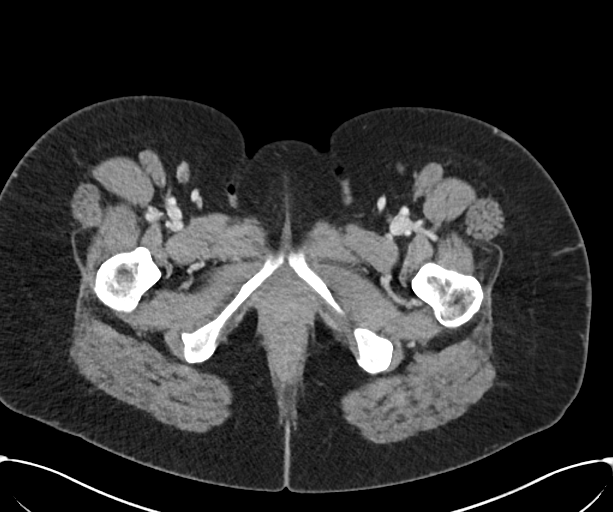
[im 6/97  bone]
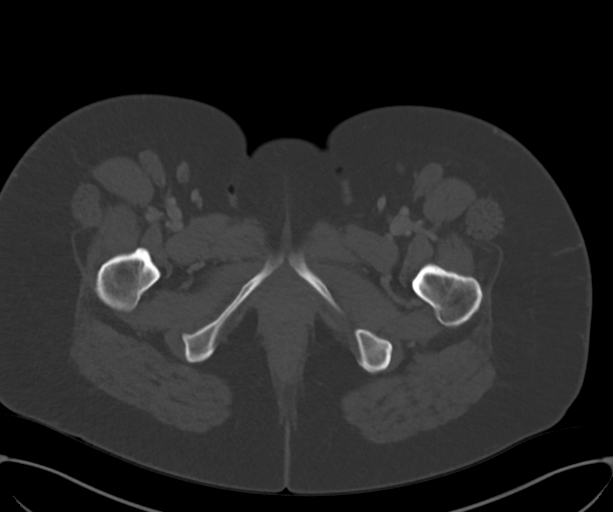
[im 16/97  soft-tissue]
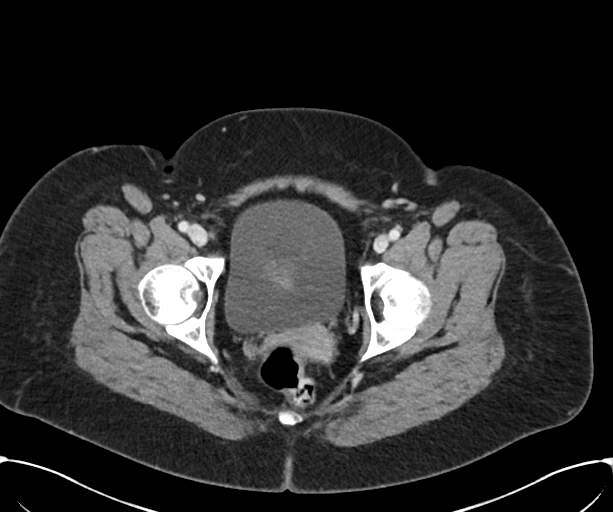
[im 26/97  soft-tissue]
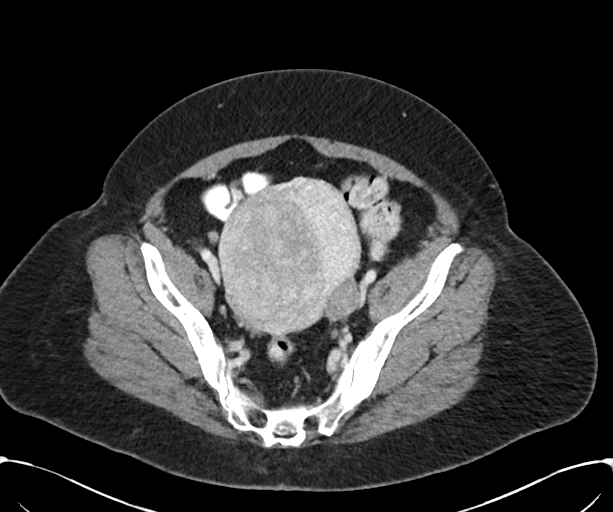
[im 31/97  soft-tissue]
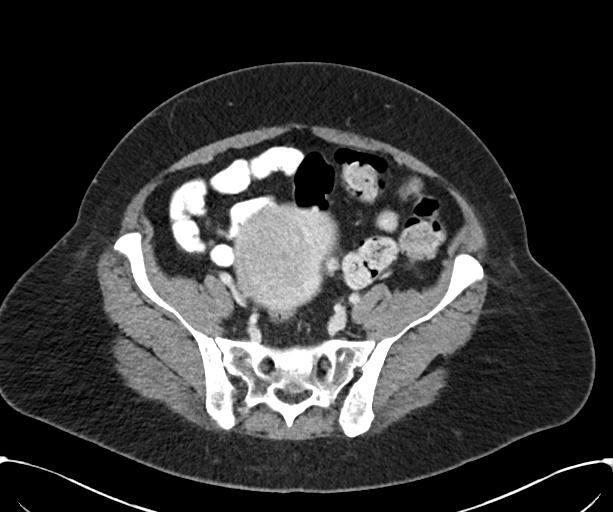
[im 41/97  soft-tissue]
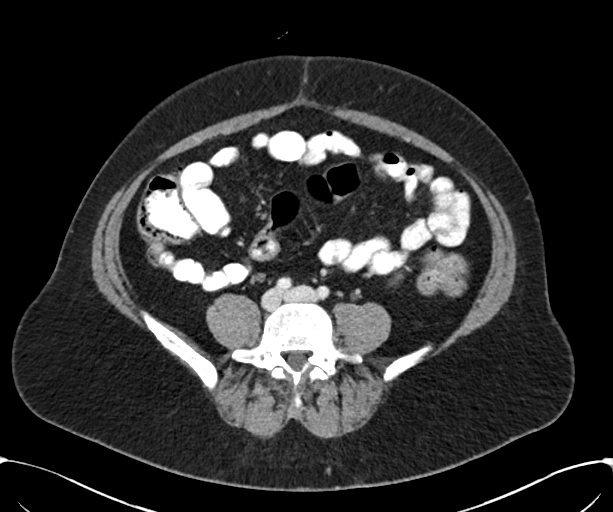
[im 51/97  soft-tissue]
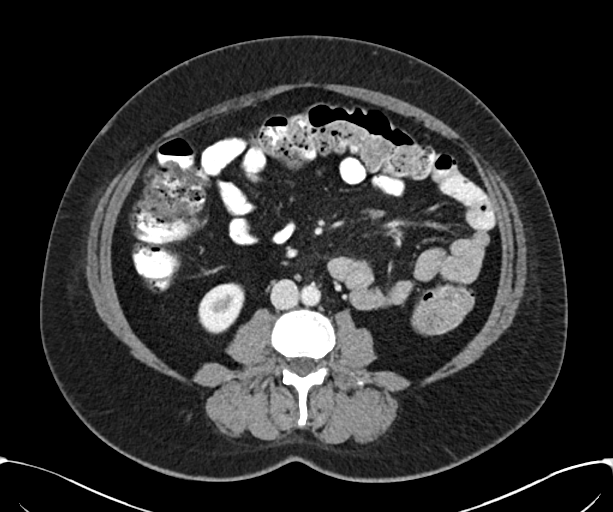
[im 56/97  soft-tissue]
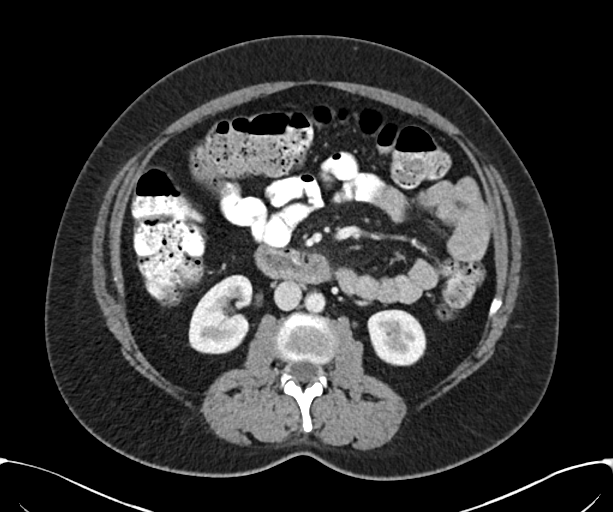
[im 66/97  soft-tissue]
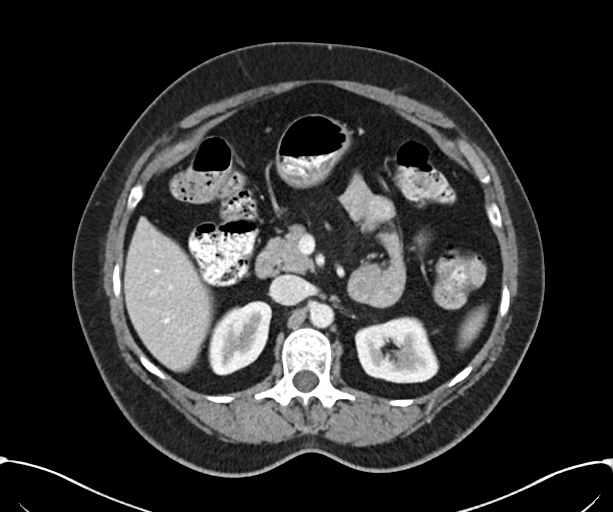
[im 71/97  soft-tissue]
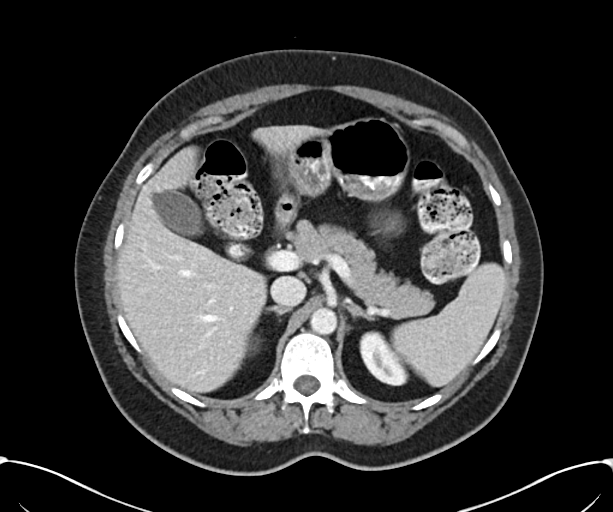
[im 71/97  bone]
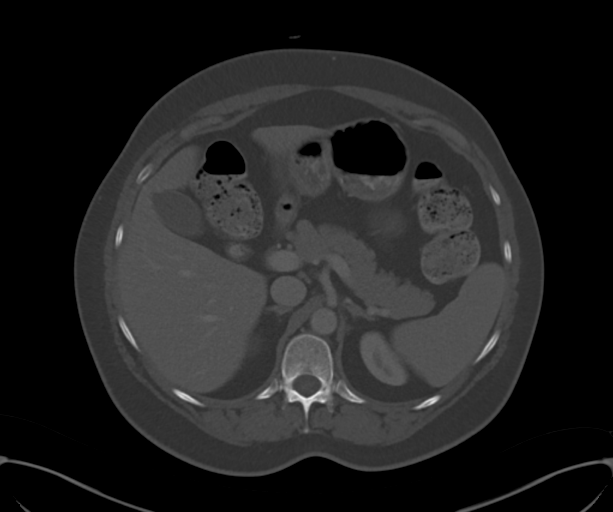
[im 81/97  soft-tissue]
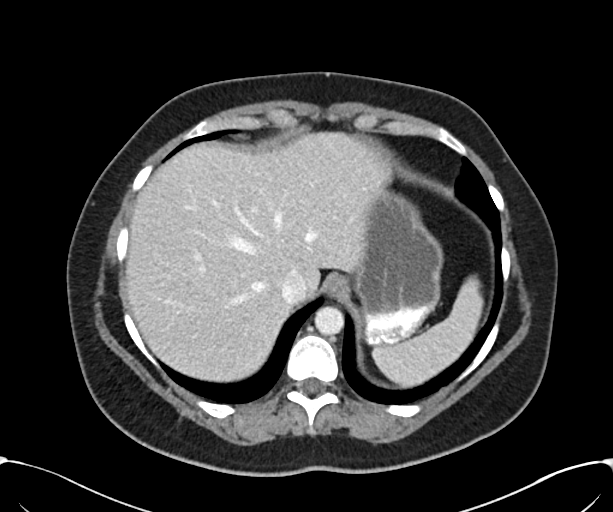
[im 91/97  soft-tissue]
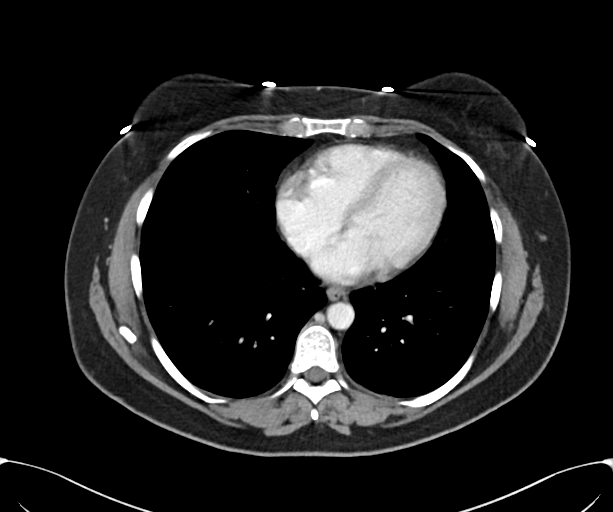

[Series 6: abd pelvis 2.00 br40 s3 cor · coronal · 0.83mm/px · 3 of 160 slices shown]
[im 54/160  soft-tissue]
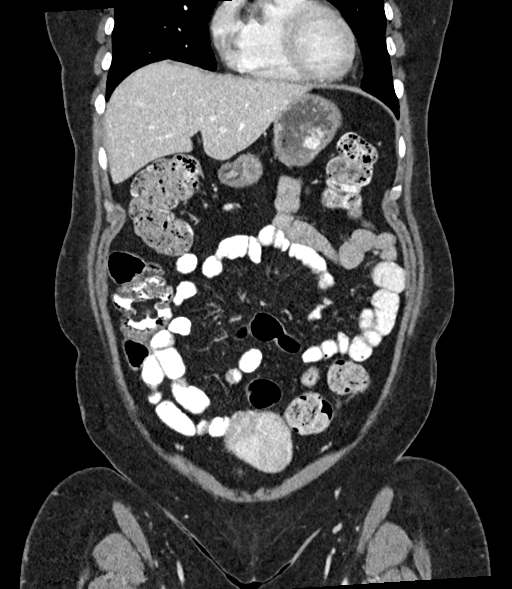
[im 71/160  soft-tissue]
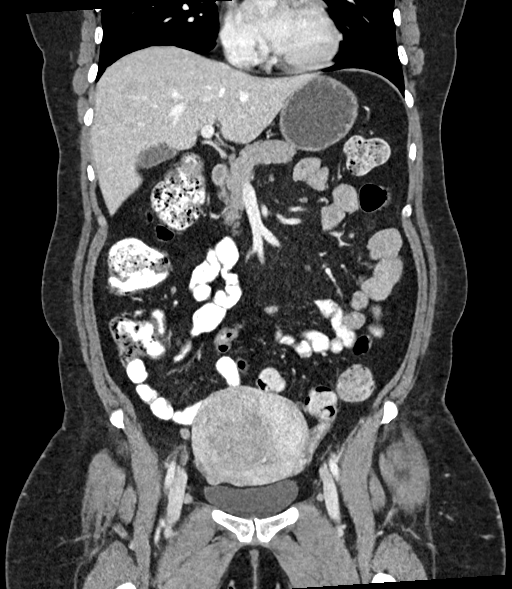
[im 89/160  soft-tissue]
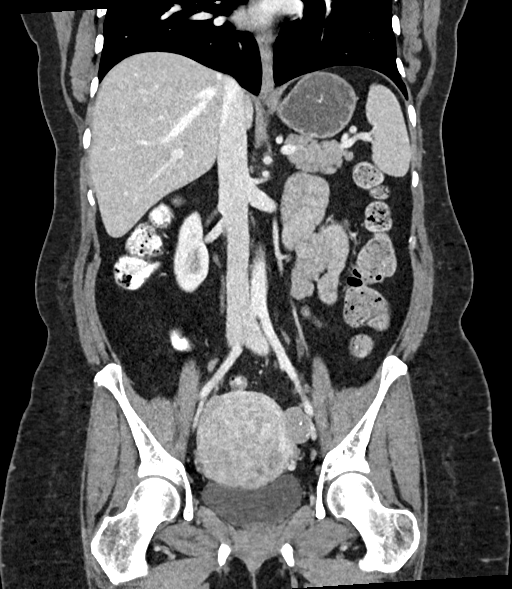

[14 of 46 positions shown; findings below may reference images not displayed]

FINDINGS: Lower chest: The lung bases are clear of acute process. No pleural
effusion or pulmonary lesions. The heart is normal in size. No
pericardial effusion. The distal esophagus and aorta are
unremarkable.

Hepatobiliary: No focal hepatic lesions to suggest metastatic
disease. The gallbladder is normal. No intra or extrahepatic biliary
dilatation. The portal and hepatic veins are patent.

Pancreas: No mass, inflammation or ductal dilatation.

Spleen: Normal size.  No focal lesions.

Adrenals/Urinary Tract: The adrenal glands and kidneys are normal.
The bladder is normal.

Stomach/Bowel: The stomach, duodenum, small bowel and colon are
unremarkable. No acute inflammatory changes, mass lesions or
obstructive findings. The terminal ileum is normal. The appendix is
normal.

Vascular/Lymphatic: The aorta is normal in caliber. No dissection.
The branch vessels are patent. The major venous structures are
patent. No mesenteric or retroperitoneal mass or adenopathy. Small
scattered lymph nodes are noted.

Reproductive: Markedly enlarged fibroid uterus with a dominant
posterior myometrial fibroid measuring approximately 10 x 7 cm. Both
ovaries appear normal. Prominent parametrial vessels may suggest
pulmonary

Other: No pelvic mass or adenopathy. No free pelvic fluid
collections. No inguinal mass or adenopathy. No abdominal wall
hernia or subcutaneous lesions.

Musculoskeletal: No significant bony findings.
IMPRESSION: 1. Stable CT appearance of the abdomen and pelvis. No findings
suspicious for metastatic disease.
2. Stable enlarged fibroid uterus.

## 2019-06-17 NOTE — Progress Notes (Signed)
Complete Physical  Assessment and Plan: Hypothyroidism, unspecified hypothyroidism type Hypothyroidism-check TSH level, continue medications the same, reminded to take on an empty stomach 30-60mins before food.  - TSH  Malignant neoplasm of choroid, left Will send labs to Dr. Rito Ehrlich, [place] 2014 - Hepatic function panel - DG Chest 2 View; Future - Gamma GT - [place] AB  Vitamin D deficiency - Vit D  25 hydroxy (rtn osteoporosis monitoring)   Medication management - CBC with Differential/Platelet - BASIC METABOLIC PANEL WITH GFR - Magnesium  Anemia, unspecified anemia type - monitor, continue iron supp with Vitamin C and increase green leafy veggies   B12 deficiency - Vitamin B12   Corticosteroid-induced glaucoma, left, stage unspecified Resolved, continue eye doctor  Psoriasis No rash at this time, monitor   Arthropathia Monitor, controlled for now  Routine general medical examination at a health care facility 1 year  Aortic atherosclerosis (HCC) Control blood pressure, cholesterol, glucose, increase exercise.   Hyperlipidemia, unspecified hyperlipidemia type -        check lipids decrease fatty foods increase activity.   Screening for diabetes mellitus -     Hemoglobin A1c  OCD/anxiety Try the prozac, if this does not help will do trazodone at night  Discussed med's effects and SE's. Screening labs and tests as requested with regular follow-up as recommended.  HPI 48 y.o. female  presents for a complete physical. Her blood pressure has been controlled at home, today their BP is BP: 122/70   She has trouble sleeping and anxiety, work has been very stressful.  Mom is on prozac and she is interested in trying it. States also has some OCD tendencies.   BMI is Body mass index is 29.87 kg/m., she is working on diet and exercise. She is counting calories and working out when she can. She has had a lot of stress from work, has been there 23 years and may leave,  works lab at [employer]. Walking 2-3 days a week, 1500 calories a day.  Wt Readings from Last 3 Encounters:  06/19/19 168 lb 9.6 oz (76.5 kg)  06/13/18 187 lb (84.8 kg)  11/21/17 182 lb (82.6 kg)   She does not workout. She denies chest pain, shortness of breath, dizziness.  She is not on cholesterol medication and denies myalgias. Her cholesterol is at goal. The cholesterol last visit was:  Lab Results  Component Value Date   CHOL 208 (H) 06/13/2018   HDL 40 (L) 06/13/2018   LDLCALC 137 (H) 06/13/2018   TRIG 175 (H) 06/13/2018   CHOLHDL 5.2 (H) 06/13/2018    Last A1C in the office was:  Lab Results  Component Value Date   HGBA1C 5.4 06/13/2018   Patient is on Vitamin D supplement, 10,000 daily, will miss some.  Lab Results  Component Value Date   VD25OH 25 (L) 06/13/2018   She has a B12 but does not take it regularly, takes sublingual takes 2 a day.  Lab Results  Component Value Date   [id number] 06/13/2018   She is on thyroid medication. Her medication was not changed last visit.  She is not on biotin.  Lab Results  Component Value Date   TSH 1.43 06/13/2018  .  She was diagnosed with ocular melanoma, left eye in 2014 and has been seeing a specialist at [facility], Dr. Rito Ehrlich but now following with Dr. Daralene Milch, she had radiation and subsequent glaucoma, following with Dr. Celene Squibb.  Had cataract surgery in June 2019.   She gets  METS surveillance yearly and is due for CT AB with contrast NOW THAT IT IS 5 YEAR PAST DX, WILL SWITCH TO AB [place] , LFTs/GTT q 6 months and CXR (04/2015).  DX code 190.6 Had Pap with Dr. Marvel Plan.  She is also seeing Dr. [place] every 8 months for skin checks.   Current Medications:  Current Outpatient Medications on File Prior to Visit  Medication Sig Dispense Refill  . carboxymethylcellulose (REFRESH PLUS) 0.5 % SOLN Apply to eye.    . Cetirizine HCl (ZYRTEC ALLERGY) 10 MG CAPS Zyrtec    . cholecalciferol (VITAMIN D) 1000 UNITS tablet Take 2,000  Units by mouth daily.    . Cyanocobalamin (VITAMIN B-12 PO) Take 1 tablet by mouth daily.    . Ferrous Sulfate (IRON) 28 MG TABS Take by mouth daily.    Marland Kitchen KRILL OIL PO Take by mouth daily.    Marland Kitchen levocetirizine (XYZAL) 5 MG tablet Take 5 mg by mouth every evening.    Marland Kitchen MELATONIN PO Take 1 tablet by mouth at bedtime.    Marland Kitchen SYNTHROID 112 MCG tablet TAKE 1 TABLET BY MOUTH DAILY BEFORE BREAKFAST. 90 tablet 1  . TURMERIC PO Take by mouth daily.    . vitamin B-12 (CYANOCOBALAMIN) 100 MCG tablet Vitamin B12     No current facility-administered medications on file prior to visit.    Health Maintenance:   Immunization History  Administered Date(s) Administered  . Tdap 08/12/2014   TDAP: 2015 Pneumovax: N/A Prevnar 13: N/A Flu vaccine: 2014 Zostavax: N/A Pap: 2016 q 5 years MGM: 2019, due this year, gets at Dr. Marvel Plan DEXA: N/A Colonoscopy: EGD:  Medical History:  Past Medical History:  Diagnosis Date  . Arthritis    hands and feet  . Cancer ([place])    ocular melanoma  . Hypothyroidism   . PONV (postoperative nausea and vomiting)    past surgery Scop patch and propofol helped  . Thyroid disease    Allergies Allergies  Allergen Reactions  . Oxycodone Nausea And Vomiting    Can take morphine and possibly Vicodin   SURGICAL HISTORY She  has a past surgical history that includes Wisdom tooth extraction; Eye surgery; and laparoscopy (N/A, 09/13/2013). FAMILY HISTORY Her family history includes Arthritis in her mother; Autoimmune disease in her father; Cancer in her maternal grandmother and mother; Cancer (age of onset: 72) in her maternal aunt; Heart disease in her maternal grandfather; Hyperlipidemia in her father; Nephrolithiasis in her father; Varicose Veins in her mother. SOCIAL HISTORY She  reports that she has never smoked. She has never used smokeless tobacco. She reports current alcohol use. She reports that she does not use drugs.  Review of Systems  Constitutional:  Negative.   HENT: Positive for congestion (allergies, on allegra). Negative for ear discharge, ear pain, hearing loss, nosebleeds, sore throat and tinnitus.   Eyes: Positive for blurred vision. Negative for double vision, photophobia, pain, discharge and redness.  Respiratory: Negative.  Negative for stridor.   Cardiovascular: Negative.   Gastrointestinal: Negative.   Genitourinary: Negative.   Musculoskeletal: Positive for joint pain (hands and feet occ). Negative for back pain, falls, myalgias and neck pain.  Skin: Negative.  Negative for itching and rash.  Neurological: Negative.  Negative for headaches.  Endo/Heme/Allergies: Negative.   Psychiatric/Behavioral: Negative.     Physical Exam: Estimated body mass index is 29.87 kg/m as calculated from the following:   Height as of this encounter: 5\' 3"  (1.6 m).   Weight as of  this encounter: 168 lb 9.6 oz (76.5 kg). BP 122/70   Pulse 78   Temp (!) 97.5 F (36.4 C)   Ht 5\' 3"  (1.6 m)   Wt 168 lb 9.6 oz (76.5 kg)   LMP 05/28/2019   SpO2 99%   BMI 29.87 kg/m  General Appearance: Well nourished, in no apparent distress. Eyes: PERRLA, EOMs, left eye ptosis improved, conjunctiva no swelling or erythema Sinuses: No Frontal/maxillary tenderness ENT/Mouth: Ext aud canals clear, normal light reflex with TMs without erythema, bulging.  Good dentition. No erythema, swelling, or exudate on post pharynx. Tonsils not swollen or erythematous. Hearing normal.  Neck: Supple, thyroid normal. No bruits Respiratory: Respiratory effort normal, BS equal bilaterally without rales, rhonchi, wheezing or stridor. Cardio: RRR without murmurs, rubs or gallops. Brisk peripheral pulses without edema.  Chest: symmetric, with normal excursions and percussion. Breasts: defer Abdomen: Soft, +BS. Non tender, no guarding, rebound, hernias, masses, or organomegaly.  Lymphatics: Non tender without lymphadenopathy.  Genitourinary: defer Musculoskeletal: Full ROM all  peripheral extremities,5/5 strength, negative squeeze test on left foot, no redness, swelling, some fibrous tissue at 2 MTP Skin:  Warm, dry without rashes, lesions, ecchymosis.  Neuro: Cranial nerves intact, reflexes equal bilaterally. Normal muscle tone, no cerebellar symptoms. Sensation intact.  Psych: Awake and oriented X 3, normal affect, Insight and Judgment appropriate.   EKG: WNL, no ST hanges   Vicie Mutters 3:19 PM

## 2019-06-19 ENCOUNTER — Ambulatory Visit (INDEPENDENT_AMBULATORY_CARE_PROVIDER_SITE_OTHER): Payer: 59 | Admitting: Physician Assistant

## 2019-06-19 ENCOUNTER — Other Ambulatory Visit: Payer: Self-pay

## 2019-06-19 ENCOUNTER — Encounter: Payer: Self-pay | Admitting: Physician Assistant

## 2019-06-19 VITALS — BP 122/70 | HR 78 | Temp 97.5°F | Ht 63.0 in | Wt 168.6 lb

## 2019-06-19 DIAGNOSIS — T380X5A Adverse effect of glucocorticoids and synthetic analogues, initial encounter: Secondary | ICD-10-CM

## 2019-06-19 DIAGNOSIS — E785 Hyperlipidemia, unspecified: Secondary | ICD-10-CM

## 2019-06-19 DIAGNOSIS — D649 Anemia, unspecified: Secondary | ICD-10-CM | POA: Diagnosis not present

## 2019-06-19 DIAGNOSIS — E559 Vitamin D deficiency, unspecified: Secondary | ICD-10-CM

## 2019-06-19 DIAGNOSIS — C6932 Malignant neoplasm of left choroid: Secondary | ICD-10-CM

## 2019-06-19 DIAGNOSIS — Z1389 Encounter for screening for other disorder: Secondary | ICD-10-CM | POA: Diagnosis not present

## 2019-06-19 DIAGNOSIS — Z79899 Other long term (current) drug therapy: Secondary | ICD-10-CM | POA: Diagnosis not present

## 2019-06-19 DIAGNOSIS — Z136 Encounter for screening for cardiovascular disorders: Secondary | ICD-10-CM

## 2019-06-19 DIAGNOSIS — H4060X0 Glaucoma secondary to drugs, unspecified eye, stage unspecified: Secondary | ICD-10-CM

## 2019-06-19 DIAGNOSIS — F429 Obsessive-compulsive disorder, unspecified: Secondary | ICD-10-CM

## 2019-06-19 DIAGNOSIS — E890 Postprocedural hypothyroidism: Secondary | ICD-10-CM

## 2019-06-19 DIAGNOSIS — Z131 Encounter for screening for diabetes mellitus: Secondary | ICD-10-CM | POA: Diagnosis not present

## 2019-06-19 DIAGNOSIS — Z0000 Encounter for general adult medical examination without abnormal findings: Secondary | ICD-10-CM | POA: Diagnosis not present

## 2019-06-19 DIAGNOSIS — E538 Deficiency of other specified B group vitamins: Secondary | ICD-10-CM

## 2019-06-19 DIAGNOSIS — L409 Psoriasis, unspecified: Secondary | ICD-10-CM

## 2019-06-19 DIAGNOSIS — I700 Atherosclerosis of aorta: Secondary | ICD-10-CM

## 2019-06-19 MED ORDER — FLUOXETINE HCL 20 MG PO CAPS: 20.0000 mg | ORAL_CAPSULE | Freq: Every day | ORAL | 2 refills | Status: DC

## 2019-06-19 MED FILL — FLUoxetine HCL 20 MG CAPS: 20 | 90 days supply | Qty: 90 | Fill #0

## 2019-06-19 NOTE — Patient Instructions (Addendum)
Check out  Mini habits for weight loss book  2 apps for tracking food is myfitness pal  loseit OR can take picture of your food  Try the prozac 20 mg first at night If this does not help than we can try the trazodone  TRAZODONE  Try the trazodone This is not habit forming, you will not build a tolerance to it Please start out at 25mg  or 1/2 pill, can increase to 50mg  at night, and the max of this medication is 150mg  at night.   Side effects can be odd dreams, dry mouth, and drowsiness.   Try the melatonin 5mg -20mg  dissolvable or gummy 30 mins before bed Can try magnesium or vitamin E at night for sleep   Morton Neuralgia  Morton neuralgia is foot pain that affects the ball of the foot and the area near the toes. Morton neuralgia occurs when part of a nerve in the foot (digital nerve) is under too much pressure (compressed). When this happens over a long period of time, the nerve can thicken (neuroma) and cause pain. Pain usually occurs between the third and fourth toes.  Morton neuralgia can come and go but may get worse over time. What are the causes? This condition is caused by doing the same things over and over with your foot, such as:  Activities such as running or jumping.  Wearing shoes that are too tight. What increases the risk? You may be at higher risk for Morton neuralgia if you:  Are female.  Wear high heels.  Wear shoes that are narrow or tight.  Do activities that repeatedly stretch your toes, such as: ? Running. ? [place]. ? Long-distance walking. What are the signs or symptoms? The first symptom of Morton neuralgia is pain that spreads from the ball of the foot to the toes. It may feel like you are walking on a marble. Pain usually gets worse with walking and goes away at night. Other symptoms may include numbness and cramping of your toes. Both feet are equally affected, but rarely at the same time. How is this diagnosed? This condition is diagnosed  based on your symptoms, your medical history, and a physical exam. Your health care provider may:  Squeeze your foot just behind your toe.  Ask you to move your toes to check for pain.  Ask about your physical activity level. You also may have imaging tests, such as an X-ray, ultrasound, or MRI. How is this treated? Treatment depends on how severe your condition is and what causes it. Treatment may involve:  Wearing different shoes that are not too tight, are low-heeled, and provide good support. For some people, this is the only treatment needed.  Wearing an over-the-counter or custom supportive pad (orthotic) under the front of your foot.  Getting injections of numbing medicine and anti-inflammatory medicine (steroid) in the nerve.  Having surgery to remove part of the thickened nerve. Follow these instructions at home: Managing pain, stiffness, and swelling   Massage your foot as needed.  Wear orthotics as told by your health care provider.  If directed, put ice on your foot: ? Put ice in a plastic bag. ? Place a towel between your skin and the bag. ? Leave the ice on for 20 minutes, 2-3 times a day.  Avoid activities that cause pain or make pain worse. If you play sports, ask your health care provider when it is safe for you to return to sports.  Raise (elevate) your foot above the  level of your heart while lying down and, when possible, while sitting. General instructions  Take over-the-counter and prescription medicines only as told by your health care provider.  Do not drive or use heavy machinery while taking prescription pain medicine.  Wear shoes that: ? Have soft soles. ? Have a wide toe area. ? Provide arch support. ? Do not pinch or squeeze your feet. ? Have room for your orthotics, if applicable.  Keep all follow-up visits as told by your health care provider. This is important. Contact a health care provider if:  Your symptoms get worse or do not get  better with treatment and home care. Summary  Morton neuralgia is foot pain that affects the ball of the foot and the area near the toes. Pain usually occurs between the third and fourth toes, gets worse with walking, and goes away at night.  Morton neuralgia occurs when part of a nerve in the foot (digital nerve) is under too much pressure. When this happens over a long period of time, the nerve can thicken (neuroma) and cause pain.  This condition is caused by doing the same things over and over with your foot, such as running or jumping, wearing shoes that are too tight, or wearing high heels.  Treatment may involve wearing low-heeled shoes that are not too tight, wearing a supportive pad (orthotic) under the front of your foot, getting injections in the nerve, or having surgery to remove part of the thickened nerve. This information is not intended to replace advice given to you by your health care provider. Make sure you discuss any questions you have with your health care provider. Document Released: 12/19/2000 Document Revised: 09/26/2017 Document Reviewed: 09/26/2017 Elsevier Patient Education  [place].  8 Critical Weight-Loss Tips That Aren't Diet and Exercise  1. STARVE THE DISTRACTIONS  All too often when we eat, we're also multitasking: watching TV, answering emails, scrolling through social media. These habits are detrimental to having a strong, clear, healthy relationship with food, and they can hinder our ability to make dietary changes.  In order to truly focus on what you're eating, how much you're eating, why you're eating those specific foods and, most importantly, how those foods make you feel, you need to starve the distractions. That means when you eat, just eat. Focus on your food, the process it went through to end up on your plate, where it came from and how it nourishes you. With this technique, you're more likely to finish a meal feeling satiated.  2.   CONSIDER WHAT YOU'RE NOT WILLING TO DO  This might sound counterintuitive, but it can help provide a "why" when motivation is waning. Declare, in writing, what you are unwilling to do, for example "I am unwilling to be the old dad who cannot play sports with my children".  So consider what you're not willing to accept, write it down, and keep it at the ready.  3.  STOP LABELING FOOD "GOOD" AND "BAD"  You've probably heard someone say they ate something "bad." Maybe you've even said it yourself.  The trouble with 'bad' foods isn't that they'll send you to the grave after a bite or two. The trouble comes when we eat excessive portions of really calorie-dense foods meal after meal, day after day.  Instead of labeling foods as good or bad, think about which foods you can eat a lot of, and which ones you should just eat a little of. Then, plan ways to  eat the foods you really like in portions that fit with your overall goals. A good example of this would be having a slice of pizza alongside a club salad with chicken breast, avocado and a bit of dressing. This is vastly different than 3 slices of pizza, 4 breadsticks with cheese sauce and half of a liter of regular soda.  4.  BRUSH YOUR TEETH AFTER YOU EAT  Getting your mindset in order is important, but sometimes small habits can make a big difference. After eating, you still have the taste of food in their mouth, which often causes people to eat more even if they are full or engage in a nibble or two of dessert.  Brushing your teeth will remove the taste of food from your mouth, and the clean, minty freshness will serve as a cue that mealtime is over.  5.  FOCUS ON CROWDING NOT CUTTING  The most common first step during 'dieting' is to cut. We cut our portion sizes down, we cut out 'bad' foods, we cut out entire food groups. This act of cutting puts [place] and our minds into scarcity mode.  When something is off-limits, even if you're able to avoid it  for a while, you could end up bingeing on it later because you've gone so long without it. So, instead of cutting, focus on crowding. If you crowd your plate and fill it up with more foods like veggies and protein, it simply allows less room for the other stuff. In other words, shift your focus away from what you can't eat, and celebrate the foods that will help you reach your goals.  6.  TAKE TRACKING A STEP FURTHER  Track what you eat, when you ate it, how much you ate and how that food made you feel. Being completely honest with yourself and writing down every single thing that passes through your lips will help you start to notice that maybe you actually do snack, possibly take in more sugar than you thought, eat when you're bored rather than just hungry or maybe that you have a habit of snacking before bed while watching TV.  The difference from simply tracking your food intake is you're taking into account how food makes you feel, as well as what you're doing while you're eating. This is about becoming more mindful of what, when and why you eat.  7.  PRIORITIZE GOOD SLEEP  One of the strongest risk factors for being overweight is poor sleep. When you're feeling tired, you're more likely to choose unhealthy comfort foods and to skip your workout. Additionally, sleep deprivation may slow down your metabolism. [employer]! Therefore, sleeping 7-8 hours per night can help with weight loss without having to change your diet or increase your physical activity. And if you feel you snore and still wake up tired, talk with me about sleep apnea.  8.  SET ASIDE TIME TO DISCONNECT  Just get out there. Disconnect from the electronics and connect to the elements. Not only will this help reduce stress (a major factor in weight gain) by giving your mind a break from the constant stimulation we've all become so accustomed to, but it may also reprogram your brain to connect with yourself and what you're feeling.

## 2019-06-20 LAB — VITAMIN B12: Vitamin B-12: 1421 pg/mL — ABNORMAL HIGH (ref 200–1100)

## 2019-06-20 LAB — COMPLETE METABOLIC PANEL WITH GFR
AG Ratio: 1.9 (calc) (ref 1.0–2.5)
ALT: 9 U/L (ref 6–29)
AST: 14 U/L (ref 10–35)
Albumin: 4.4 g/dL (ref 3.6–5.1)
Alkaline phosphatase (APISO): 69 U/L (ref 31–125)
BUN: 14 mg/dL (ref 7–25)
CO2: 24 mmol/L (ref 20–32)
Calcium: 9.6 mg/dL (ref 8.6–10.2)
Chloride: 105 mmol/L (ref 98–110)
Creat: 0.66 mg/dL (ref 0.50–1.10)
GFR, Est African American: 123 mL/min/{1.73_m2} (ref >=60)
GFR, Est Non African American: 106 mL/min/{1.73_m2} (ref >=60)
Globulin: 2.3 g/dL (calc) (ref 1.9–3.7)
Glucose, Bld: 87 mg/dL (ref 65–99)
Potassium: 3.9 mmol/L (ref 3.5–5.3)
Sodium: 138 mmol/L (ref 135–146)
Total Bilirubin: 0.3 mg/dL (ref 0.2–1.2)
Total Protein: 6.7 g/dL (ref 6.1–8.1)

## 2019-06-20 LAB — CBC WITH DIFFERENTIAL/PLATELET
Absolute Monocytes: 288 cells/uL (ref 200–950)
Basophils Absolute: 38 cells/uL (ref 0–200)
Basophils Relative: 0.6 %
Eosinophils Absolute: 32 cells/uL (ref 15–500)
Eosinophils Relative: 0.5 %
HCT: 36.1 % (ref 35.0–45.0)
Hemoglobin: 12 g/dL (ref 11.7–15.5)
Lymphs Abs: 2234 cells/uL (ref 850–3900)
MCH: 28.6 pg (ref 27.0–33.0)
MCHC: 33.2 g/dL (ref 32.0–36.0)
MCV: 86 fL (ref 80.0–100.0)
MPV: 10.3 fL (ref 7.5–12.5)
Monocytes Relative: 4.5 %
Neutro Abs: 3808 cells/uL (ref 1500–7800)
Neutrophils Relative %: 59.5 %
Platelets: 291 10*3/uL (ref 140–400)
RBC: 4.2 10*6/uL (ref 3.80–5.10)
RDW: 13.6 % (ref 11.0–15.0)
Total Lymphocyte: 34.9 %
WBC: 6.4 10*3/uL (ref 3.8–10.8)

## 2019-06-20 LAB — LIPID PANEL
Cholesterol: 159 mg/dL (ref <200)
HDL: 38 mg/dL — ABNORMAL LOW (ref >=50)
LDL Cholesterol (Calc): 100 mg/dL (calc) — ABNORMAL HIGH
Non-HDL Cholesterol (Calc): 121 mg/dL (calc) (ref <130)
Total CHOL/HDL Ratio: 4.2 (calc) (ref <5.0)
Triglycerides: 113 mg/dL (ref <150)

## 2019-06-20 LAB — URINALYSIS, ROUTINE W REFLEX MICROSCOPIC
Bilirubin Urine: NEGATIVE
Glucose, UA: NEGATIVE
Hgb urine dipstick: NEGATIVE
Ketones, ur: NEGATIVE
Leukocytes,Ua: NEGATIVE
Nitrite: NEGATIVE
Protein, ur: NEGATIVE
Specific Gravity, Urine: 1.006 (ref 1.001–1.03)
pH: 6 (ref 5.0–8.0)

## 2019-06-20 LAB — HEMOGLOBIN A1C
Hgb A1c MFr Bld: 5.2 % of total Hgb (ref <5.7)
Mean Plasma Glucose: 103 (calc)
eAG (mmol/L): 5.7 (calc)

## 2019-06-20 LAB — MICROALBUMIN / CREATININE URINE RATIO
Creatinine, Urine: 30 mg/dL (ref 20–275)
Microalb, Ur: <0.2 mg/dL

## 2019-06-20 LAB — IRON, TOTAL/TOTAL IRON BINDING CAP
%SAT: 11 % (calc) — ABNORMAL LOW (ref 16–45)
Iron: 39 ug/dL — ABNORMAL LOW (ref 40–190)
TIBC: 360 mcg/dL (calc) (ref 250–450)

## 2019-06-20 LAB — VITAMIN D 25 HYDROXY (VIT D DEFICIENCY, FRACTURES): Vit D, 25-Hydroxy: 45 ng/mL (ref 30–100)

## 2019-06-20 LAB — GAMMA GT: GGT: 10 U/L (ref 3–55)

## 2019-06-20 LAB — TSH: TSH: 0.54 mIU/L

## 2019-06-20 LAB — MAGNESIUM: Magnesium: 1.8 mg/dL (ref 1.5–2.5)

## 2019-07-01 ENCOUNTER — Other Ambulatory Visit: Payer: Self-pay | Admitting: Physician Assistant

## 2019-07-01 MED FILL — SYNTHROID 112 MCG TABLET: 112 | 90 days supply | Qty: 90 | Fill #0

## 2019-07-11 DIAGNOSIS — Z1231 Encounter for screening mammogram for malignant neoplasm of breast: Secondary | ICD-10-CM | POA: Diagnosis not present

## 2019-07-11 DIAGNOSIS — Z6829 Body mass index (BMI) 29.0-29.9, adult: Secondary | ICD-10-CM | POA: Diagnosis not present

## 2019-07-11 DIAGNOSIS — Z01419 Encounter for gynecological examination (general) (routine) without abnormal findings: Secondary | ICD-10-CM | POA: Diagnosis not present

## 2019-07-11 DIAGNOSIS — Z124 Encounter for screening for malignant neoplasm of cervix: Secondary | ICD-10-CM | POA: Diagnosis not present

## 2019-07-15 ENCOUNTER — Other Ambulatory Visit: Payer: 59

## 2019-07-17 ENCOUNTER — Ambulatory Visit
Admission: RE | Admit: 2019-07-17 | Discharge: 2019-07-17 | Disposition: A | Discharge status: Home / Self Care | Payer: 59 | Source: Ambulatory Visit | Attending: Physician Assistant | Admitting: Physician Assistant

## 2019-07-17 DIAGNOSIS — C6932 Malignant neoplasm of left choroid: Secondary | ICD-10-CM

## 2019-07-17 DIAGNOSIS — Z85840 Personal history of malignant neoplasm of eye: Secondary | ICD-10-CM | POA: Diagnosis not present

## 2019-07-30 ENCOUNTER — Encounter: Payer: Self-pay | Admitting: Internal Medicine

## 2019-08-19 DIAGNOSIS — D2261 Melanocytic nevi of right upper limb, including shoulder: Secondary | ICD-10-CM | POA: Diagnosis not present

## 2019-08-19 DIAGNOSIS — L821 Other seborrheic keratosis: Secondary | ICD-10-CM | POA: Diagnosis not present

## 2019-08-19 DIAGNOSIS — L858 Other specified epidermal thickening: Secondary | ICD-10-CM | POA: Diagnosis not present

## 2019-08-19 DIAGNOSIS — D1801 Hemangioma of skin and subcutaneous tissue: Secondary | ICD-10-CM | POA: Diagnosis not present

## 2019-08-19 DIAGNOSIS — D225 Melanocytic nevi of trunk: Secondary | ICD-10-CM | POA: Diagnosis not present

## 2019-08-19 DIAGNOSIS — D2262 Melanocytic nevi of left upper limb, including shoulder: Secondary | ICD-10-CM | POA: Diagnosis not present

## 2019-08-19 DIAGNOSIS — D2271 Melanocytic nevi of right lower limb, including hip: Secondary | ICD-10-CM | POA: Diagnosis not present

## 2019-08-19 DIAGNOSIS — D2372 Other benign neoplasm of skin of left lower limb, including hip: Secondary | ICD-10-CM | POA: Diagnosis not present

## 2019-08-28 DIAGNOSIS — R0789 Other chest pain: Secondary | ICD-10-CM | POA: Diagnosis not present

## 2019-08-28 DIAGNOSIS — H4312 Vitreous hemorrhage, left eye: Secondary | ICD-10-CM | POA: Diagnosis not present

## 2019-08-28 DIAGNOSIS — Z79899 Other long term (current) drug therapy: Secondary | ICD-10-CM | POA: Diagnosis not present

## 2019-08-28 DIAGNOSIS — C6932 Malignant neoplasm of left choroid: Secondary | ICD-10-CM | POA: Diagnosis not present

## 2019-08-28 DIAGNOSIS — H3522 Other non-diabetic proliferative retinopathy, left eye: Secondary | ICD-10-CM | POA: Diagnosis not present

## 2019-09-04 DIAGNOSIS — H0289 Other specified disorders of eyelid: Secondary | ICD-10-CM | POA: Diagnosis not present

## 2019-09-04 DIAGNOSIS — C6932 Malignant neoplasm of left choroid: Secondary | ICD-10-CM | POA: Diagnosis not present

## 2019-09-04 DIAGNOSIS — H3562 Retinal hemorrhage, left eye: Secondary | ICD-10-CM | POA: Diagnosis not present

## 2019-09-04 DIAGNOSIS — H35412 Lattice degeneration of retina, left eye: Secondary | ICD-10-CM | POA: Diagnosis not present

## 2019-09-04 DIAGNOSIS — H4312 Vitreous hemorrhage, left eye: Secondary | ICD-10-CM | POA: Diagnosis not present

## 2019-09-04 DIAGNOSIS — H33102 Unspecified retinoschisis, left eye: Secondary | ICD-10-CM | POA: Diagnosis not present

## 2019-09-09 DIAGNOSIS — H4312 Vitreous hemorrhage, left eye: Secondary | ICD-10-CM | POA: Diagnosis not present

## 2019-09-09 DIAGNOSIS — C6932 Malignant neoplasm of left choroid: Secondary | ICD-10-CM | POA: Diagnosis not present

## 2019-09-17 DIAGNOSIS — H3589 Other specified retinal disorders: Secondary | ICD-10-CM | POA: Diagnosis not present

## 2019-09-17 DIAGNOSIS — C6932 Malignant neoplasm of left choroid: Secondary | ICD-10-CM | POA: Diagnosis not present

## 2019-09-17 DIAGNOSIS — H4312 Vitreous hemorrhage, left eye: Secondary | ICD-10-CM | POA: Diagnosis not present

## 2019-09-18 MED FILL — SYNTHROID 112 MCG TABLET: 112 | 90 days supply | Qty: 90 | Fill #1

## 2019-09-18 MED FILL — FLUoxetine HCL 20 MG CAPS: 20 | 90 days supply | Qty: 90 | Fill #1

## 2019-09-23 DIAGNOSIS — H2000 Unspecified acute and subacute iridocyclitis: Secondary | ICD-10-CM | POA: Diagnosis not present

## 2019-09-23 MED FILL — PREDNISOLONE AC 1% EYE DROP: 1 | 6 days supply | Qty: 5 | Fill #0

## 2019-09-23 MED FILL — ATROPINE 1% EYE DROPS: 1 | 75 days supply | Qty: 5 | Fill #0

## 2019-09-25 DIAGNOSIS — H4312 Vitreous hemorrhage, left eye: Secondary | ICD-10-CM | POA: Diagnosis not present

## 2019-09-25 MED FILL — ACETAZOLAMIDE ER 500 MG CAP: 500 | 5 days supply | Qty: 10 | Fill #0

## 2019-09-25 MED FILL — OXYCODONE-ACETAMINOPHEN 5-3: 5-325 | 4 days supply | Qty: 20 | Fill #0

## 2019-09-25 MED FILL — DORZOLAMIDE-TIMOLOL EYE DRP: 22.3-6.8 | 75 days supply | Qty: 10 | Fill #0

## 2019-09-25 MED FILL — BRIMONIDINE 0.2% EYE DROP: 0.2 | 37 days supply | Qty: 5 | Fill #0

## 2019-09-25 MED FILL — LATANOPROST 0.005% EYE DRP: 0.005 | 38 days supply | Qty: 3 | Fill #0

## 2019-09-26 DIAGNOSIS — H4312 Vitreous hemorrhage, left eye: Secondary | ICD-10-CM | POA: Diagnosis not present

## 2019-09-26 DIAGNOSIS — H4052X2 Glaucoma secondary to other eye disorders, left eye, moderate stage: Secondary | ICD-10-CM | POA: Diagnosis not present

## 2019-09-27 DIAGNOSIS — Z9001 Acquired absence of eye: Secondary | ICD-10-CM

## 2019-09-27 HISTORY — DX: Acquired absence of eye: Z90.01

## 2019-09-27 HISTORY — PX: VITRECTOMY: SHX106

## 2019-09-27 HISTORY — PX: ENUCLEATION: SHX628

## 2019-09-28 DIAGNOSIS — H4312 Vitreous hemorrhage, left eye: Secondary | ICD-10-CM | POA: Diagnosis not present

## 2019-09-30 DIAGNOSIS — H2102 Hyphema, left eye: Secondary | ICD-10-CM | POA: Diagnosis not present

## 2019-09-30 DIAGNOSIS — H4312 Vitreous hemorrhage, left eye: Secondary | ICD-10-CM | POA: Diagnosis not present

## 2019-10-01 DIAGNOSIS — Z20828 Contact with and (suspected) exposure to other viral communicable diseases: Secondary | ICD-10-CM | POA: Diagnosis not present

## 2019-10-01 DIAGNOSIS — H4312 Vitreous hemorrhage, left eye: Secondary | ICD-10-CM | POA: Diagnosis not present

## 2019-10-01 DIAGNOSIS — C6932 Malignant neoplasm of left choroid: Secondary | ICD-10-CM | POA: Diagnosis not present

## 2019-10-01 DIAGNOSIS — H3589 Other specified retinal disorders: Secondary | ICD-10-CM | POA: Diagnosis not present

## 2019-10-03 DIAGNOSIS — C6932 Malignant neoplasm of left choroid: Secondary | ICD-10-CM | POA: Diagnosis not present

## 2019-10-03 DIAGNOSIS — Z79899 Other long term (current) drug therapy: Secondary | ICD-10-CM | POA: Diagnosis not present

## 2019-10-03 DIAGNOSIS — H4312 Vitreous hemorrhage, left eye: Secondary | ICD-10-CM | POA: Diagnosis not present

## 2019-10-03 DIAGNOSIS — H3562 Retinal hemorrhage, left eye: Secondary | ICD-10-CM | POA: Diagnosis not present

## 2019-10-03 DIAGNOSIS — E039 Hypothyroidism, unspecified: Secondary | ICD-10-CM | POA: Diagnosis not present

## 2019-10-03 DIAGNOSIS — H35413 Lattice degeneration of retina, bilateral: Secondary | ICD-10-CM | POA: Diagnosis not present

## 2019-10-03 DIAGNOSIS — C6992 Malignant neoplasm of unspecified site of left eye: Secondary | ICD-10-CM | POA: Diagnosis not present

## 2019-10-03 DIAGNOSIS — H338 Other retinal detachments: Secondary | ICD-10-CM | POA: Diagnosis not present

## 2019-10-03 DIAGNOSIS — H33102 Unspecified retinoschisis, left eye: Secondary | ICD-10-CM | POA: Diagnosis not present

## 2019-10-03 HISTORY — PX: FINE NEEDLE ASPIRATION: SHX406

## 2019-10-03 MED FILL — ERYTHROMYCIN EYE OINTMENT: 5 | 7 days supply | Qty: 4 | Fill #0

## 2019-10-03 MED FILL — OFLOXACIN 0.3% EYE DROPS: 0.3 | 18 days supply | Qty: 5 | Fill #0

## 2019-10-03 MED FILL — PREDNISOLONE AC 1% EYE DROP: 1 | 28 days supply | Qty: 10 | Fill #0

## 2019-10-10 DIAGNOSIS — C6932 Malignant neoplasm of left choroid: Secondary | ICD-10-CM | POA: Diagnosis not present

## 2019-10-16 DIAGNOSIS — Z20822 Contact with and (suspected) exposure to covid-19: Secondary | ICD-10-CM | POA: Diagnosis not present

## 2019-10-16 DIAGNOSIS — C6932 Malignant neoplasm of left choroid: Secondary | ICD-10-CM | POA: Diagnosis not present

## 2019-10-18 DIAGNOSIS — E039 Hypothyroidism, unspecified: Secondary | ICD-10-CM | POA: Diagnosis not present

## 2019-10-18 DIAGNOSIS — Z801 Family history of malignant neoplasm of trachea, bronchus and lung: Secondary | ICD-10-CM | POA: Diagnosis not present

## 2019-10-18 DIAGNOSIS — Z79899 Other long term (current) drug therapy: Secondary | ICD-10-CM | POA: Diagnosis not present

## 2019-10-18 DIAGNOSIS — C6932 Malignant neoplasm of left choroid: Secondary | ICD-10-CM | POA: Diagnosis not present

## 2019-10-18 DIAGNOSIS — C6990 Malignant neoplasm of unspecified site of unspecified eye: Secondary | ICD-10-CM | POA: Diagnosis not present

## 2019-10-18 MED FILL — ERYTHROMYCIN EYE OINTMENT: 5 | 7 days supply | Qty: 4 | Fill #0

## 2019-10-18 MED FILL — ONDANSETRON HCL 4 MG TABLET: 4 | 6 days supply | Qty: 20 | Fill #0

## 2019-10-22 DIAGNOSIS — C6932 Malignant neoplasm of left choroid: Secondary | ICD-10-CM | POA: Diagnosis not present

## 2019-11-08 ENCOUNTER — Encounter: Payer: Self-pay | Admitting: Internal Medicine

## 2019-11-08 DIAGNOSIS — C6932 Malignant neoplasm of left choroid: Secondary | ICD-10-CM | POA: Diagnosis not present

## 2019-11-11 DIAGNOSIS — C6932 Malignant neoplasm of left choroid: Secondary | ICD-10-CM | POA: Diagnosis not present

## 2019-12-20 ENCOUNTER — Other Ambulatory Visit: Payer: Self-pay | Admitting: Internal Medicine

## 2019-12-20 MED FILL — FLUoxetine HCL 20 MG CAPS: 20 | 90 days supply | Qty: 90 | Fill #2

## 2019-12-20 MED FILL — SYNTHROID 112 MCG TABLET: 112 | 90 days supply | Qty: 90 | Fill #0

## 2019-12-26 DIAGNOSIS — Z4420 Encounter for fitting and adjustment of artificial eye, unspecified: Secondary | ICD-10-CM | POA: Diagnosis not present

## 2020-01-30 DIAGNOSIS — H02135 Senile ectropion of left lower eyelid: Secondary | ICD-10-CM | POA: Diagnosis not present

## 2020-01-30 DIAGNOSIS — Q111 Other anophthalmos: Secondary | ICD-10-CM | POA: Diagnosis not present

## 2020-01-30 DIAGNOSIS — C6932 Malignant neoplasm of left choroid: Secondary | ICD-10-CM | POA: Diagnosis not present

## 2020-02-14 DIAGNOSIS — R590 Localized enlarged lymph nodes: Secondary | ICD-10-CM | POA: Diagnosis not present

## 2020-02-14 DIAGNOSIS — R918 Other nonspecific abnormal finding of lung field: Secondary | ICD-10-CM | POA: Diagnosis not present

## 2020-02-14 DIAGNOSIS — C6932 Malignant neoplasm of left choroid: Secondary | ICD-10-CM | POA: Diagnosis not present

## 2020-02-17 DIAGNOSIS — C6932 Malignant neoplasm of left choroid: Secondary | ICD-10-CM | POA: Diagnosis not present

## 2020-03-19 ENCOUNTER — Other Ambulatory Visit: Payer: Self-pay | Admitting: Physician Assistant

## 2020-03-19 ENCOUNTER — Other Ambulatory Visit: Payer: Self-pay | Admitting: Adult Health

## 2020-03-19 ENCOUNTER — Other Ambulatory Visit: Payer: Self-pay | Admitting: Internal Medicine

## 2020-03-19 DIAGNOSIS — F429 Obsessive-compulsive disorder, unspecified: Secondary | ICD-10-CM

## 2020-03-19 MED FILL — SYNTHROID 112 MCG TABLET: 112 | 90 days supply | Qty: 90 | Fill #0

## 2020-03-19 MED FILL — FLUoxetine HCL 20 MG CAPS: 20 | 90 days supply | Qty: 90 | Fill #0

## 2020-05-13 DIAGNOSIS — C6932 Malignant neoplasm of left choroid: Secondary | ICD-10-CM | POA: Diagnosis not present

## 2020-05-13 DIAGNOSIS — R911 Solitary pulmonary nodule: Secondary | ICD-10-CM | POA: Diagnosis not present

## 2020-05-13 DIAGNOSIS — C439 Malignant melanoma of skin, unspecified: Secondary | ICD-10-CM | POA: Diagnosis not present

## 2020-05-20 DIAGNOSIS — C6932 Malignant neoplasm of left choroid: Secondary | ICD-10-CM | POA: Diagnosis not present

## 2020-05-20 DIAGNOSIS — Z91041 Radiographic dye allergy status: Secondary | ICD-10-CM | POA: Diagnosis not present

## 2020-05-20 MED FILL — predniSONE 50 MG TABS: 50 | 1 days supply | Qty: 3 | Fill #0

## 2020-06-03 DIAGNOSIS — C6990 Malignant neoplasm of unspecified site of unspecified eye: Secondary | ICD-10-CM | POA: Diagnosis not present

## 2020-06-03 DIAGNOSIS — H02135 Senile ectropion of left lower eyelid: Secondary | ICD-10-CM | POA: Diagnosis not present

## 2020-06-03 DIAGNOSIS — Z85840 Personal history of malignant neoplasm of eye: Secondary | ICD-10-CM | POA: Diagnosis not present

## 2020-06-03 DIAGNOSIS — Z6832 Body mass index (BMI) 32.0-32.9, adult: Secondary | ICD-10-CM | POA: Diagnosis not present

## 2020-06-03 DIAGNOSIS — E669 Obesity, unspecified: Secondary | ICD-10-CM | POA: Diagnosis not present

## 2020-06-03 DIAGNOSIS — C6992 Malignant neoplasm of unspecified site of left eye: Secondary | ICD-10-CM | POA: Diagnosis not present

## 2020-06-03 DIAGNOSIS — Z79899 Other long term (current) drug therapy: Secondary | ICD-10-CM | POA: Diagnosis not present

## 2020-06-03 DIAGNOSIS — Z9001 Acquired absence of eye: Secondary | ICD-10-CM | POA: Diagnosis not present

## 2020-06-03 DIAGNOSIS — H05422 Enophthalmos due to trauma or surgery, left eye: Secondary | ICD-10-CM | POA: Diagnosis not present

## 2020-06-15 IMAGING — US US ABDOMEN COMPLETE
1 series · 14 of 25 positions shown · non-contrast
Comparison: None.

CLINICAL DATA: History of ocular melanoma

EXAM:
ABDOMEN ULTRASOUND COMPLETE

[Series 1: us abdomen complete · 0.20mm/px · 14 of 83 slices shown]
[im 1/83]
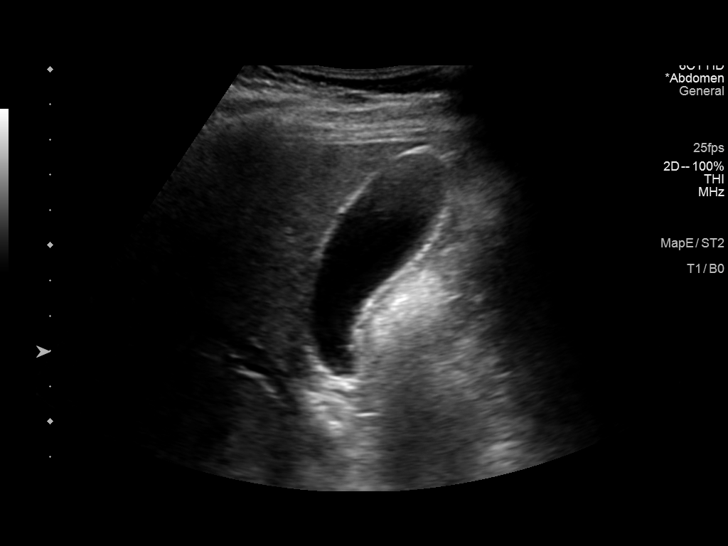
[im 7/83]
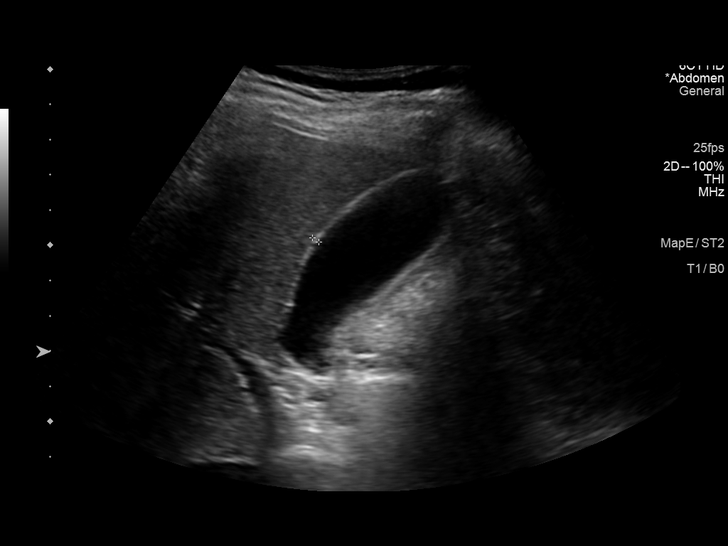
[im 14/83]
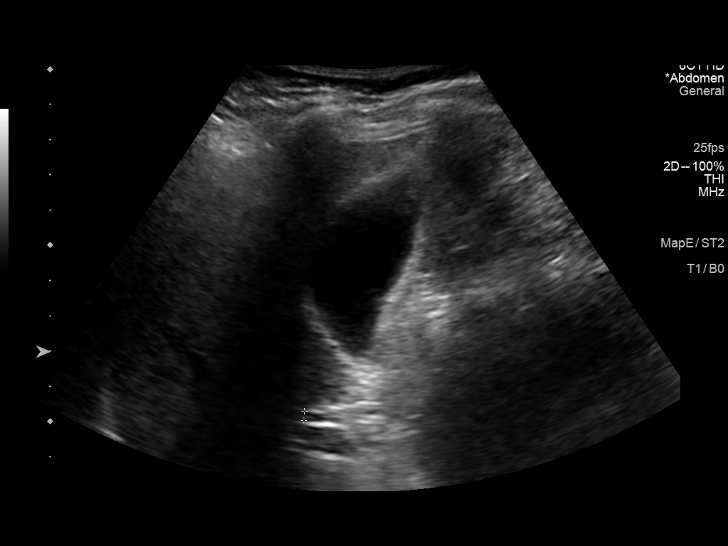
[im 21/83]
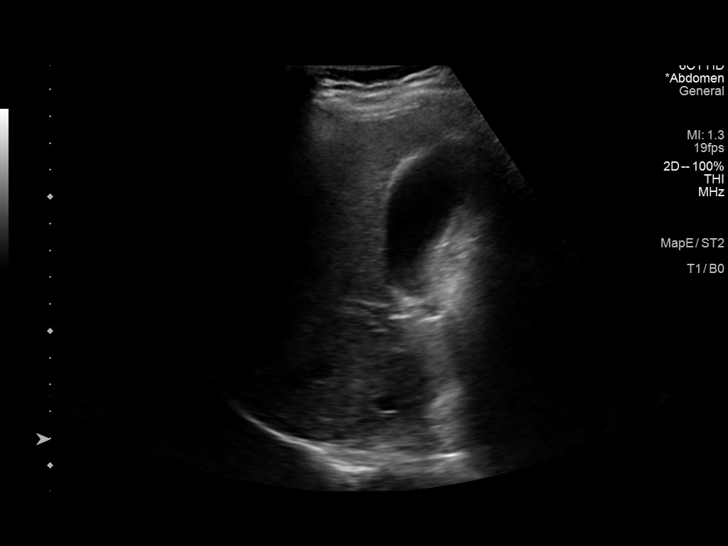
[im 28/83]
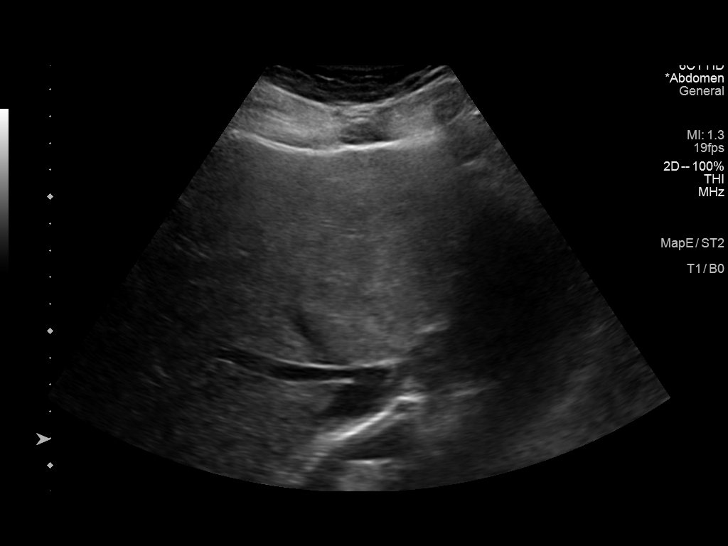
[im 31/83]
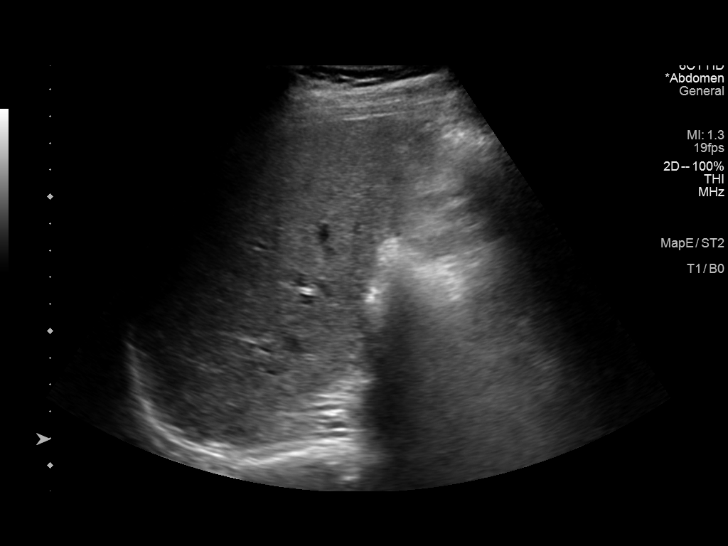
[im 38/83]
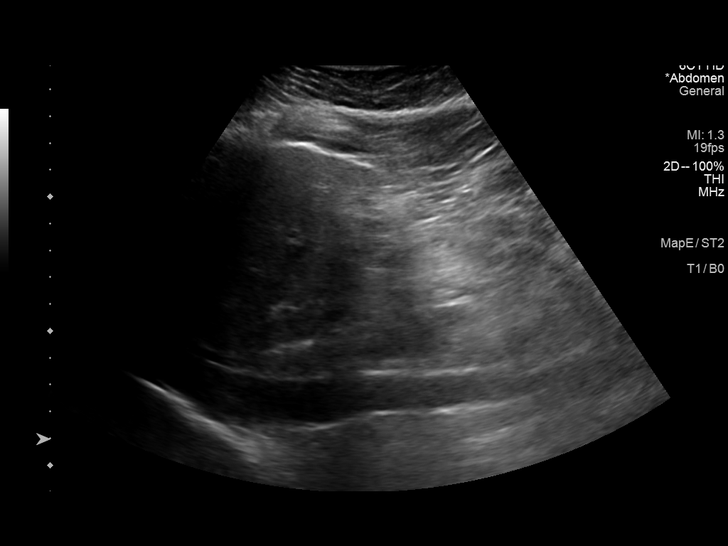
[im 45/83]
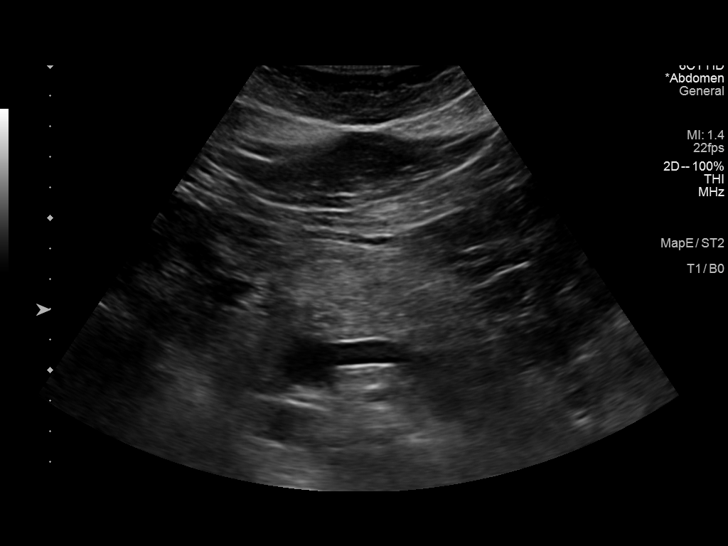
[im 52/83]
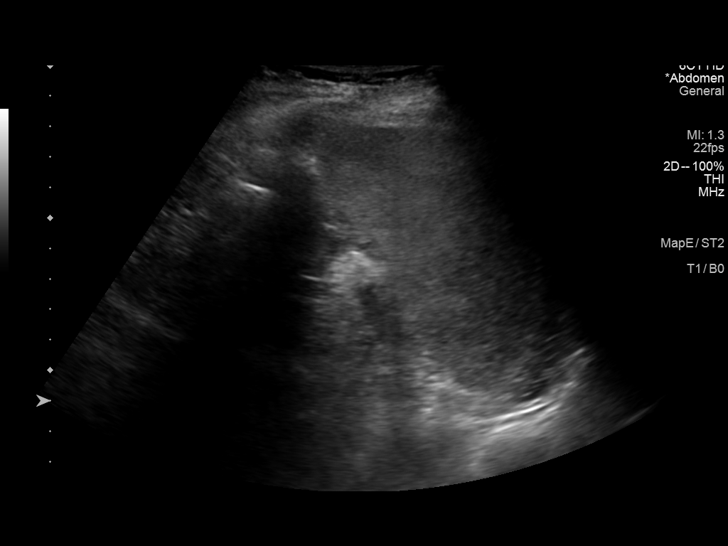
[im 55/83]
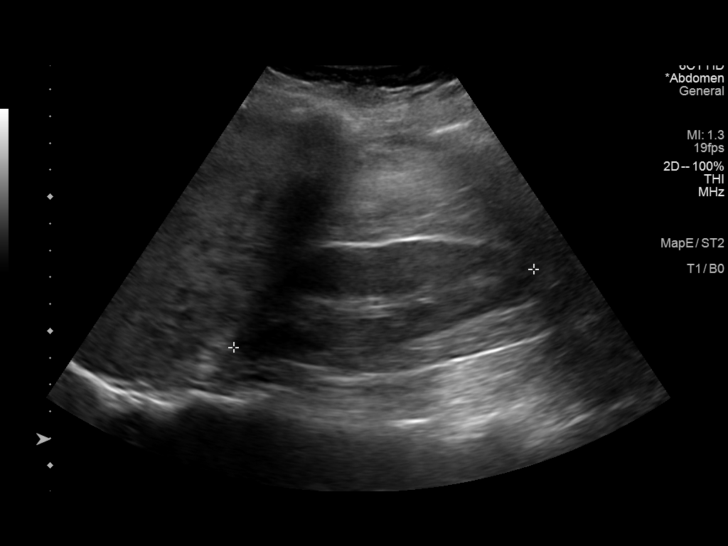
[im 62/83]
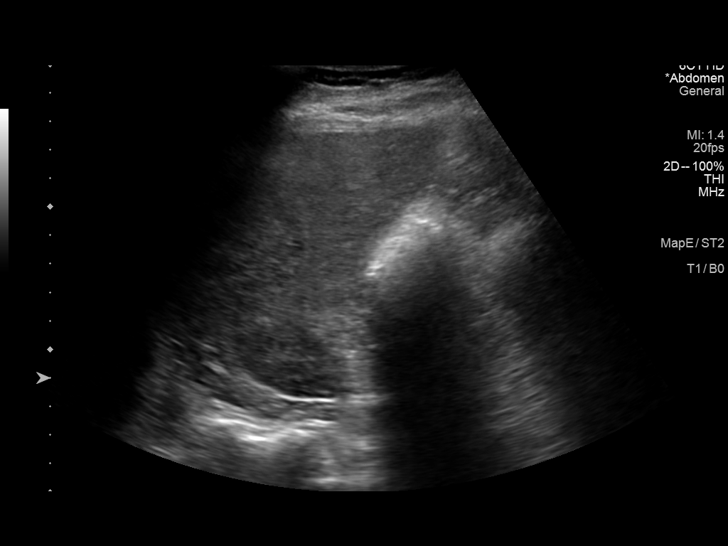
[im 69/83]
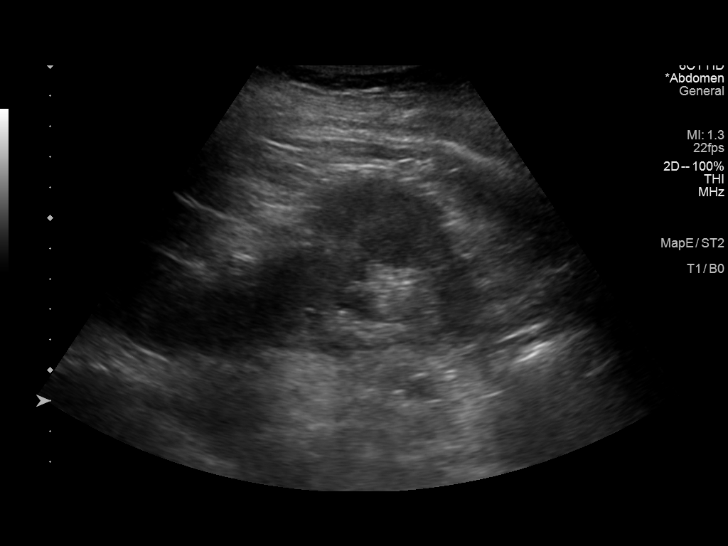
[im 76/83]
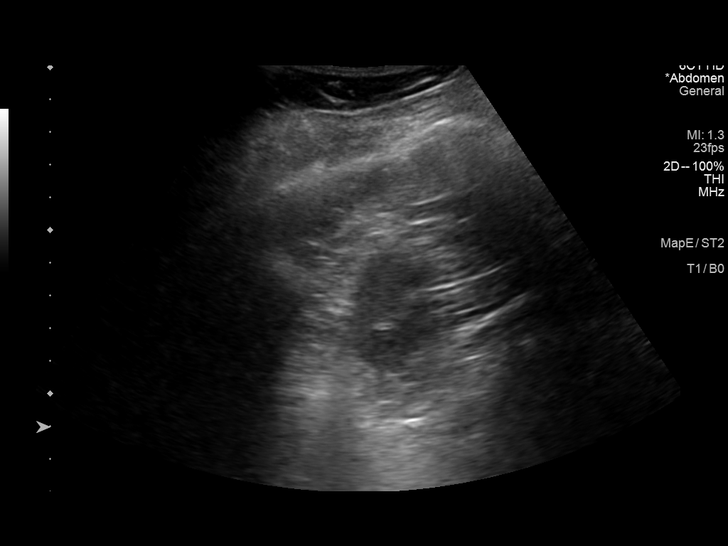
[im 83/83]
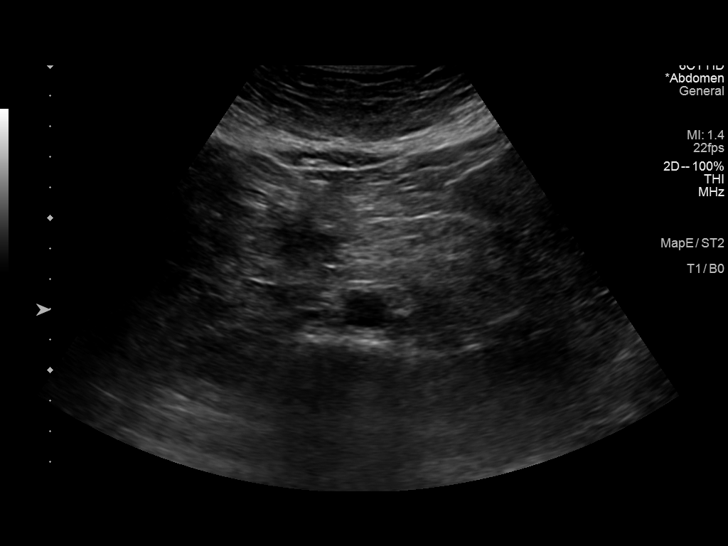

[14 of 25 positions shown; findings below may reference images not displayed]

FINDINGS: Gallbladder: No gallstones or wall thickening visualized. There is
no pericholecystic fluid. No sonographic Murphy sign noted by
sonographer.

Common bile duct: Diameter: 3 mm. No intrahepatic, common hepatic,
or common bile duct dilatation.

Liver: No focal lesion identified. Within normal limits in
parenchymal echogenicity. Portal vein is patent on color Doppler
imaging with normal direction of blood flow towards the liver.

IVC: No abnormality visualized.

Pancreas: No pancreatic mass or inflammatory focus.

Spleen: Size and appearance within normal limits.

Right Kidney: Length: 11.8 cm. Echogenicity within normal limits. No
mass or hydronephrosis visualized.

Left Kidney: Length: 10.6 cm. Echogenicity within normal limits. No
mass or hydronephrosis visualized.

Abdominal aorta: No aneurysm visualized.

Other findings: No demonstrable ascites.
IMPRESSION: Study within normal limits.

## 2020-06-17 MED FILL — FLUoxetine HCL 20 MG CAPS: 20 | 90 days supply | Qty: 90 | Fill #1

## 2020-06-18 ENCOUNTER — Ambulatory Visit (INDEPENDENT_AMBULATORY_CARE_PROVIDER_SITE_OTHER): Payer: 59 | Admitting: Physician Assistant

## 2020-06-18 ENCOUNTER — Other Ambulatory Visit: Payer: Self-pay

## 2020-06-18 VITALS — BP 122/80 | HR 85 | Temp 97.3°F | Ht 63.0 in | Wt 176.2 lb

## 2020-06-18 DIAGNOSIS — Z0001 Encounter for general adult medical examination with abnormal findings: Secondary | ICD-10-CM

## 2020-06-18 DIAGNOSIS — E785 Hyperlipidemia, unspecified: Secondary | ICD-10-CM | POA: Diagnosis not present

## 2020-06-18 DIAGNOSIS — L409 Psoriasis, unspecified: Secondary | ICD-10-CM

## 2020-06-18 DIAGNOSIS — I700 Atherosclerosis of aorta: Secondary | ICD-10-CM

## 2020-06-18 DIAGNOSIS — E890 Postprocedural hypothyroidism: Secondary | ICD-10-CM

## 2020-06-18 DIAGNOSIS — D649 Anemia, unspecified: Secondary | ICD-10-CM

## 2020-06-18 DIAGNOSIS — C6932 Malignant neoplasm of left choroid: Secondary | ICD-10-CM

## 2020-06-18 DIAGNOSIS — E538 Deficiency of other specified B group vitamins: Secondary | ICD-10-CM

## 2020-06-18 DIAGNOSIS — Z1211 Encounter for screening for malignant neoplasm of colon: Secondary | ICD-10-CM | POA: Diagnosis not present

## 2020-06-18 DIAGNOSIS — Z79899 Other long term (current) drug therapy: Secondary | ICD-10-CM | POA: Diagnosis not present

## 2020-06-18 DIAGNOSIS — Z0000 Encounter for general adult medical examination without abnormal findings: Secondary | ICD-10-CM | POA: Diagnosis not present

## 2020-06-18 DIAGNOSIS — Z1389 Encounter for screening for other disorder: Secondary | ICD-10-CM | POA: Diagnosis not present

## 2020-06-18 DIAGNOSIS — Z9001 Acquired absence of eye: Secondary | ICD-10-CM | POA: Insufficient documentation

## 2020-06-18 DIAGNOSIS — E559 Vitamin D deficiency, unspecified: Secondary | ICD-10-CM

## 2020-06-18 DIAGNOSIS — R7309 Other abnormal glucose: Secondary | ICD-10-CM

## 2020-06-18 NOTE — Progress Notes (Signed)
Complete Physical  Assessment and Plan: Hypothyroidism, unspecified hypothyroidism type Hypothyroidism-check TSH level, continue medications the same, reminded to take on an empty stomach 30-36mins before food.  - TSH  Malignant neoplasm of choroid, left S/p eye enucleation.   Vitamin D deficiency - Vit D  25 hydroxy (rtn osteoporosis monitoring)   Medication management - CBC with Differential/Platelet - BASIC METABOLIC PANEL WITH GFR - Magnesium  Anemia, unspecified anemia type - monitor, continue iron supp with Vitamin C and increase green leafy veggies   B12 deficiency - Vitamin B12  Psoriasis No rash at this time, monitor  Routine general medical examination at a health care facility 1 year  Aortic atherosclerosis ([place]) Control blood pressure, cholesterol, glucose, increase exercise.   Hyperlipidemia, unspecified hyperlipidemia type -        check lipids decrease fatty foods increase activity.   Screening for diabetes mellitus -     Hemoglobin A1c  OCD/anxiety Continue prozac, doing well  Screen for colon cancer Will do FIT test Patient has no fm hx, does not want more procedures at this time, will do FIT test and discuss further next year   Discussed med's effects and SE's. Screening labs and tests as requested with regular follow-up as recommended.  HPI 49 y.o. female  presents for a complete physical. Her blood pressure has been controlled at home, today their BP is BP: 122/80   She was diagnosed with ocular melanoma, left eye in 2014 and has been seeing a specialist at [facility], Dr. Rito Ehrlich but now following with Dr. Daralene Milch, she had radiation and subsequent glaucoma, following with Dr. Celene Squibb.  Had cataract surgery in June 2019.   This in Dec of this year she had bleeding into her left eye, and ended up having a reoccurence s/p enucleation of her left eye 10/18/2019. Now having CT AB with contrast q 3 months and following closely with [facility].  She had  recent grafting of liposection of AB tissue for her left eye. She gets METS surveillance yearly and is due for CT AB with contrast.  Had Pap with Dr. Marvel Plan.  She is also seeing Dr. [place] every 6 months for skin checks.  She has trouble sleeping and anxiety, was placed on prozac and states it seems to be helping.   BMI is Body mass index is 31.21 kg/m., she is working on diet and exercise. She is counting calories and working out when she can but with her recent health issues has not been working out. Works lab at [employer].  Wt Readings from Last 3 Encounters:  06/18/20 176 lb 3.2 oz (79.9 kg)  06/19/19 168 lb 9.6 oz (76.5 kg)  06/13/18 187 lb (84.8 kg)   She does not workout. She denies chest pain, shortness of breath, dizziness.  She is not on cholesterol medication and denies myalgias. Her cholesterol is at goal. The cholesterol last visit was:  Lab Results  Component Value Date   CHOL 159 06/19/2019   HDL 38 (L) 06/19/2019   LDLCALC 100 (H) 06/19/2019   TRIG 113 06/19/2019   CHOLHDL 4.2 06/19/2019    Last A1C in the office was:  Lab Results  Component Value Date   HGBA1C 5.2 06/19/2019   Patient is on Vitamin D supplement, 10,000 daily, will miss some.  Lab Results  Component Value Date   VD25OH 45 06/19/2019   She has a B12 but does not take it regularly, takes sublingual takes 2 a day.  Lab Results  Component Value  Date   VITAMINB12 1,421 (H) 06/19/2019   She is on thyroid medication. Her medication was not changed last visit.  She is not on biotin.  Lab Results  Component Value Date   TSH 0.54 06/19/2019  .  She is not on iron regularlly Lab Results  Component Value Date   IRON 39 (L) 06/19/2019   TIBC 360 06/19/2019   FERRITIN 13 04/01/2015     Current Medications:   Current Outpatient Medications (Endocrine & Metabolic):  .  SYNTHROID 112 MCG tablet, TAKE 1 TABLET DAILY ON AN EMPTY STOMACH WITH ONLY WATER FOR 30 MINUTES & NO ANTACID MEDS, CALCIUM OR  MAGNESIUM FOR 4 HOURS & AVOID BIOTIN   Current Outpatient Medications (Respiratory):  Marland Kitchen  Cetirizine HCl (ZYRTEC ALLERGY) 10 MG CAPS, Zyrtec .  levocetirizine (XYZAL) 5 MG tablet, Take 5 mg by mouth every evening.   Current Outpatient Medications (Hematological):  Marland Kitchen  Cyanocobalamin (VITAMIN B-12 PO), Take 1 tablet by mouth daily. .  Ferrous Sulfate (IRON) 28 MG TABS, Take by mouth daily. .  vitamin B-12 (CYANOCOBALAMIN) 100 MCG tablet, Vitamin B12  Current Outpatient Medications (Other):  .  carboxymethylcellulose (REFRESH PLUS) 0.5 % SOLN, Apply to eye. .  cholecalciferol (VITAMIN D) 1000 UNITS tablet, Take 2,000 Units by mouth daily. Marland Kitchen  FLUoxetine (PROZAC) 20 MG capsule, TAKE 1 CAPSULE (20 MG TOTAL) BY MOUTH DAILY. Marland Kitchen  KRILL OIL PO, Take by mouth daily. Marland Kitchen  MELATONIN PO, Take 1 tablet by mouth at bedtime. .  TURMERIC PO, Take by mouth daily.  Health Maintenance:   Immunization History  Administered Date(s) Administered  . Influenza,inj,Quad PF,6+ Mos 05/28/2015  . PFIZER SARS-COV-2 Vaccination 01/17/2020, 02/14/2020  . Tdap 08/12/2014   TDAP: 2015  Pneumovax: N/A Prevnar 13: N/A Flu vaccine: 2014 Zostavax: N/A COVID vaccines complete Pap: 2016 q 5 years due this year MGM: 2020, due this year, gets at Dr. Marvel Plan DEXA: N/A Colonoscopy: due age 75 EGD:  Medical History:  Past Medical History:  Diagnosis Date  . Arthritis    hands and feet  . Cancer ([place])    ocular melanoma  . Hypothyroidism   . PONV (postoperative nausea and vomiting)    past surgery Scop patch and propofol helped  . Thyroid disease    Allergies Allergies  Allergen Reactions  . Oxycodone Nausea And Vomiting    Can take morphine and possibly Vicodin   SURGICAL HISTORY She  has a past surgical history that includes Wisdom tooth extraction; Eye surgery; and laparoscopy (N/A, 09/13/2013).   FAMILY HISTORY Her family history includes Arthritis in her mother; Autoimmune disease in her father;  Cancer in her maternal grandmother and mother; Cancer (age of onset: 31) in her maternal aunt; Heart disease in her maternal grandfather; Hyperlipidemia in her father; Nephrolithiasis in her father; Varicose Veins in her mother.   SOCIAL HISTORY She  reports that she has never smoked. She has never used smokeless tobacco. She reports current alcohol use. She reports that she does not use drugs.  Review of Systems  Constitutional: Negative.   HENT: Positive for congestion (allergies, on allegra). Negative for ear discharge, ear pain, hearing loss, nosebleeds, sore throat and tinnitus.   Eyes: Negative for blurred vision, double vision, photophobia, pain, discharge and redness.  Respiratory: Negative.  Negative for stridor.   Cardiovascular: Negative.   Gastrointestinal: Negative.   Genitourinary: Negative.   Musculoskeletal: Positive for joint pain (hands and feet occ). Negative for back pain, falls, myalgias and neck pain.  Skin: Negative.  Negative for itching and rash.  Neurological: Negative.  Negative for headaches.  Endo/Heme/Allergies: Negative.   Psychiatric/Behavioral: Negative.     Physical Exam: Estimated body mass index is 31.21 kg/m as calculated from the following:   Height as of this encounter: 5\' 3"  (1.6 m).   Weight as of this encounter: 176 lb 3.2 oz (79.9 kg). BP 122/80   Pulse 85   Temp (!) 97.3 F (36.3 C)   Ht 5\' 3"  (1.6 m)   Wt 176 lb 3.2 oz (79.9 kg)   LMP 06/16/2020   SpO2 97%   BMI 31.21 kg/m  General Appearance: Well nourished, in no apparent distress. Eyes: PERRLA, EOMs, left eye ptosis improved, conjunctiva no swelling or erythema Sinuses: No Frontal/maxillary tenderness ENT/Mouth: Ext aud canals clear, normal light reflex with TMs without erythema, bulging.  Good dentition. No erythema, swelling, or exudate on post pharynx. Tonsils not swollen or erythematous. Hearing normal.  Neck: Supple, thyroid normal. No bruits Respiratory: Respiratory effort  normal, BS equal bilaterally without rales, rhonchi, wheezing or stridor. Cardio: RRR without murmurs, rubs or gallops. Brisk peripheral pulses without edema.  Chest: symmetric, with normal excursions and percussion. Breasts: defer Abdomen: Soft, +BS. Non tender, no guarding, rebound, hernias, masses, or organomegaly.  Lymphatics: Non tender without lymphadenopathy.  Genitourinary: defer Musculoskeletal: Full ROM all peripheral extremities,5/5 strength, negative squeeze test on left foot, no redness, swelling, some fibrous tissue at 2 MTP Skin:  Warm, dry without rashes, lesions, ecchymosis.  Neuro: Cranial nerves intact, reflexes equal bilaterally. Normal muscle tone, no cerebellar symptoms. Sensation intact.  Psych: Awake and oriented X 3, normal affect, Insight and Judgment appropriate.   EKG: defer   Vicie Mutters 3:17 PM

## 2020-06-19 LAB — FERRITIN: Ferritin: 11 ng/mL — ABNORMAL LOW (ref 16–232)

## 2020-06-19 LAB — COMPLETE METABOLIC PANEL WITH GFR
AG Ratio: 1.9 (calc) (ref 1.0–2.5)
ALT: 10 U/L (ref 6–29)
AST: 13 U/L (ref 10–35)
Albumin: 4.3 g/dL (ref 3.6–5.1)
Alkaline phosphatase (APISO): 95 U/L (ref 31–125)
BUN: 14 mg/dL (ref 7–25)
CO2: 28 mmol/L (ref 20–32)
Calcium: 9.2 mg/dL (ref 8.6–10.2)
Chloride: 105 mmol/L (ref 98–110)
Creat: 0.61 mg/dL (ref 0.50–1.10)
GFR, Est African American: 125 mL/min/{1.73_m2} (ref >=60)
GFR, Est Non African American: 108 mL/min/{1.73_m2} (ref >=60)
Globulin: 2.3 g/dL (calc) (ref 1.9–3.7)
Glucose, Bld: 84 mg/dL (ref 65–99)
Potassium: 4.1 mmol/L (ref 3.5–5.3)
Sodium: 138 mmol/L (ref 135–146)
Total Bilirubin: 0.4 mg/dL (ref 0.2–1.2)
Total Protein: 6.6 g/dL (ref 6.1–8.1)

## 2020-06-19 LAB — CBC WITH DIFFERENTIAL/PLATELET
Absolute Monocytes: 338 cells/uL (ref 200–950)
Basophils Absolute: 39 cells/uL (ref 0–200)
Basophils Relative: 0.6 %
Eosinophils Absolute: 78 cells/uL (ref 15–500)
Eosinophils Relative: 1.2 %
HCT: 36.1 % (ref 35.0–45.0)
Hemoglobin: 12.1 g/dL (ref 11.7–15.5)
Lymphs Abs: 1833 cells/uL (ref 850–3900)
MCH: 29 pg (ref 27.0–33.0)
MCHC: 33.5 g/dL (ref 32.0–36.0)
MCV: 86.6 fL (ref 80.0–100.0)
MPV: 9.8 fL (ref 7.5–12.5)
Monocytes Relative: 5.2 %
Neutro Abs: 4212 cells/uL (ref 1500–7800)
Neutrophils Relative %: 64.8 %
Platelets: 278 10*3/uL (ref 140–400)
RBC: 4.17 10*6/uL (ref 3.80–5.10)
RDW: 13.2 % (ref 11.0–15.0)
Total Lymphocyte: 28.2 %
WBC: 6.5 10*3/uL (ref 3.8–10.8)

## 2020-06-19 LAB — TSH: TSH: 0.21 mIU/L — ABNORMAL LOW

## 2020-06-19 LAB — LIPID PANEL
Cholesterol: 202 mg/dL — ABNORMAL HIGH (ref <200)
HDL: 42 mg/dL — ABNORMAL LOW (ref >=50)
LDL Cholesterol (Calc): 136 mg/dL (calc) — ABNORMAL HIGH
Non-HDL Cholesterol (Calc): 160 mg/dL (calc) — ABNORMAL HIGH (ref <130)
Total CHOL/HDL Ratio: 4.8 (calc) (ref <5.0)
Triglycerides: 119 mg/dL (ref <150)

## 2020-06-19 LAB — URINALYSIS, ROUTINE W REFLEX MICROSCOPIC
Bilirubin Urine: NEGATIVE
Glucose, UA: NEGATIVE
Hgb urine dipstick: NEGATIVE
Ketones, ur: NEGATIVE
Leukocytes,Ua: NEGATIVE
Nitrite: NEGATIVE
Protein, ur: NEGATIVE
Specific Gravity, Urine: 1.011 (ref 1.001–1.03)
pH: 7 (ref 5.0–8.0)

## 2020-06-19 LAB — IRON, TOTAL/TOTAL IRON BINDING CAP
%SAT: 14 % (calc) — ABNORMAL LOW (ref 16–45)
Iron: 53 ug/dL (ref 40–190)
TIBC: 389 mcg/dL (calc) (ref 250–450)

## 2020-06-19 LAB — MICROALBUMIN / CREATININE URINE RATIO
Creatinine, Urine: 55 mg/dL (ref 20–275)
Microalb, Ur: <0.2 mg/dL

## 2020-06-19 LAB — VITAMIN D 25 HYDROXY (VIT D DEFICIENCY, FRACTURES): Vit D, 25-Hydroxy: 30 ng/mL (ref 30–100)

## 2020-06-19 LAB — MAGNESIUM: Magnesium: 2 mg/dL (ref 1.5–2.5)

## 2020-06-19 LAB — HEMOGLOBIN A1C
Hgb A1c MFr Bld: 5.3 % of total Hgb (ref <5.7)
Mean Plasma Glucose: 105 (calc)
eAG (mmol/L): 5.8 (calc)

## 2020-07-02 ENCOUNTER — Other Ambulatory Visit: Payer: Self-pay | Admitting: Adult Health

## 2020-07-02 MED FILL — SYNTHROID 112 MCG TABLET: 112 | 90 days supply | Qty: 90 | Fill #0

## 2020-09-04 DIAGNOSIS — Z4420 Encounter for fitting and adjustment of artificial eye, unspecified: Secondary | ICD-10-CM | POA: Diagnosis not present

## 2020-09-05 DIAGNOSIS — C439 Malignant melanoma of skin, unspecified: Secondary | ICD-10-CM | POA: Diagnosis not present

## 2020-09-05 DIAGNOSIS — C6932 Malignant neoplasm of left choroid: Secondary | ICD-10-CM | POA: Diagnosis not present

## 2020-09-05 DIAGNOSIS — R911 Solitary pulmonary nodule: Secondary | ICD-10-CM | POA: Diagnosis not present

## 2020-09-09 DIAGNOSIS — C6932 Malignant neoplasm of left choroid: Secondary | ICD-10-CM | POA: Diagnosis not present

## 2020-09-10 MED FILL — predniSONE 50 MG TABS: 50 | 1 days supply | Qty: 3 | Fill #0

## 2020-09-16 MED FILL — FLUoxetine HCL 20 MG CAPS: 20 | 90 days supply | Qty: 90 | Fill #2

## 2020-10-08 ENCOUNTER — Other Ambulatory Visit: Payer: Self-pay | Admitting: Adult Health Nurse Practitioner

## 2020-10-08 ENCOUNTER — Other Ambulatory Visit: Payer: Self-pay | Admitting: Adult Health

## 2020-10-08 MED FILL — SYNTHROID 112 MCG TABLET: 112 | 90 days supply | Qty: 90 | Fill #0

## 2020-12-14 DIAGNOSIS — C6932 Malignant neoplasm of left choroid: Secondary | ICD-10-CM | POA: Diagnosis not present

## 2020-12-14 DIAGNOSIS — R911 Solitary pulmonary nodule: Secondary | ICD-10-CM | POA: Diagnosis not present

## 2020-12-16 DIAGNOSIS — C6932 Malignant neoplasm of left choroid: Secondary | ICD-10-CM | POA: Diagnosis not present

## 2020-12-30 ENCOUNTER — Other Ambulatory Visit (HOSPITAL_COMMUNITY): Payer: Self-pay

## 2020-12-30 ENCOUNTER — Other Ambulatory Visit: Payer: Self-pay | Admitting: Adult Health

## 2020-12-30 DIAGNOSIS — F429 Obsessive-compulsive disorder, unspecified: Secondary | ICD-10-CM

## 2020-12-30 MED ORDER — FLUOXETINE HCL 20 MG PO CAPS
20.0000 mg | ORAL_CAPSULE | Freq: Every day | ORAL | 1 refills | Status: DC
  Filled 2020-12-30: qty 90, 90d supply, fill #0
  Filled 2021-04-05: qty 90, 90d supply, fill #1

## 2021-01-21 ENCOUNTER — Other Ambulatory Visit: Payer: Self-pay

## 2021-01-22 ENCOUNTER — Other Ambulatory Visit: Payer: Self-pay | Admitting: Adult Health Nurse Practitioner

## 2021-01-22 ENCOUNTER — Other Ambulatory Visit (HOSPITAL_COMMUNITY): Payer: Self-pay

## 2021-01-22 MED ORDER — SYNTHROID 112 MCG PO TABS
ORAL_TABLET | ORAL | 1 refills | Status: DC
  Filled 2021-01-22: qty 90, 90d supply, fill #0
  Filled 2021-04-27: qty 90, 90d supply, fill #1

## 2021-01-25 ENCOUNTER — Other Ambulatory Visit (HOSPITAL_COMMUNITY): Payer: Self-pay

## 2021-03-19 DIAGNOSIS — Z4420 Encounter for fitting and adjustment of artificial eye, unspecified: Secondary | ICD-10-CM | POA: Diagnosis not present

## 2021-03-22 DIAGNOSIS — C6932 Malignant neoplasm of left choroid: Secondary | ICD-10-CM | POA: Diagnosis not present

## 2021-03-23 DIAGNOSIS — C6932 Malignant neoplasm of left choroid: Secondary | ICD-10-CM | POA: Diagnosis not present

## 2021-03-24 NOTE — Progress Notes (Addendum)
[facility] Note  03/30/2021     CHIEF COMPLAINT Patient presents for Retina Follow Up   HISTORY OF PRESENT ILLNESS: Tracy Kidd is a 50 y.o. female who presents to the clinic today for:   HPI     Retina Follow Up   Patient presents with  Other.  In left eye.  Since onset it is rapidly worsening.  I, the attending physician,  performed the HPI with the patient and updated documentation appropriately.        Comments   At times at night she will notice a FOL.  Only occasionally x1- 1/2 yrs. Vision appears stable OD.  Since last visit the melanoma returned OS and ended up removing the eye at [facility] 10/2019. This is the reason she missed her last appts with [place].       Last edited by Bernarda Caffey, MD on 04/01/2021  2:03 PM.    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 Underwent vitrectomy and ultimately end up with enucleation OS. Patient has had eyelid sx as well on left side.   Referring physician: Luberta Mutter, MD [place],  [place] [address]  HISTORICAL INFORMATION:   Selected notes from the MEDICAL RECORD NUMBER Referred by Dr. Albina Billet for cataract surery clearance OS;  LEE- 03.01.19 (C. McCuen) [BCVA OD: 20/20 OS: 20/50 MRx: OD: -7.00+1.00x030 OS: -5.50 sph] Ocular Hx- hx ocular melanoma OS (treated [facility] with radiation in 2014), s/p laser retinopexy OD for lattice degen, cataract OS;  PMH-    CURRENT MEDICATIONS: Current Outpatient Medications (Ophthalmic Drugs)  Medication Sig   carboxymethylcellulose (REFRESH PLUS) 0.5 % SOLN Apply to eye.   No current facility-administered medications for this visit. (Ophthalmic Drugs)   Current Outpatient Medications (Other)  Medication Sig   cholecalciferol (VITAMIN D) 1000 UNITS tablet Take 2,000 Units by mouth daily.   Cyanocobalamin (VITAMIN B-12 PO) Take 1 tablet by mouth daily.   Ferrous Sulfate (IRON) 28 MG TABS Take by mouth daily.    FLUoxetine (PROZAC) 20 MG capsule Take  1 capsule  Daily  for Mood   KRILL OIL PO Take by mouth daily.   levocetirizine (XYZAL) 5 MG tablet Take 5 mg by mouth every evening.   MELATONIN PO Take 1 tablet by mouth at bedtime.   SYNTHROID 112 MCG tablet TAKE 1 TABLET BY MOUTH DAILY ON AN EMPTY STOMACH WITH ONLY WATER FOR 30 MINUTES. NO ANTACID MEDS, CALCIUM OR MAGNESIUM FOR 4 HOURS. AVOID BIOTIN.   TURMERIC PO Take by mouth daily.   vitamin B-12 (CYANOCOBALAMIN) 100 MCG tablet Vitamin B12   Cetirizine HCl 10 MG CAPS Zyrtec (Patient not taking: Reported on 03/30/2021)   No current facility-administered medications for this visit. (Other)      REVIEW OF SYSTEMS: ROS   Positive for: Eyes, Heme/Lymph Negative for: Constitutional, Gastrointestinal, Neurological, Skin, Genitourinary, Musculoskeletal, HENT, Endocrine, Cardiovascular, Respiratory, Psychiatric, Allergic/Imm Last edited by Leonie Douglas, COA on 03/30/2021  3:09 PM.        ALLERGIES Allergies  Allergen Reactions   Povidone-Iodine Swelling    Only in EYE, tolerates on skin   Latanoprost Other (See Comments)    Burns and stings, severe tearing   Oxycodone Nausea And Vomiting    Can take morphine and possibly Vicodin Can take morphine and possibly Vicodin    PAST MEDICAL HISTORY Past Medical History:  Diagnosis Date   Arthritis    hands and feet  Cancer ([place])    ocular melanoma   Hypothyroidism    PONV (postoperative nausea and vomiting)    past surgery Scop patch and propofol helped   Thyroid disease    Past Surgical History:  Procedure Laterality Date   EYE SURGERY     radiation plaque placement and removal   LAPAROSCOPY N/A 09/13/2013   Procedure: LAPAROSCOPY OPERATIVE, removal of pelvic mass;  Surgeon: Logan Bores, MD;  Location: [facility];  Service: Gynecology;  Laterality: N/A;   WISDOM TOOTH EXTRACTION      FAMILY HISTORY Family History  Problem Relation Age of Onset   Varicose Veins Mother     Arthritis Mother    Cancer Mother        Melanoma   Nephrolithiasis Father    Hyperlipidemia Father    Autoimmune disease Father        atrophic gastritis/B12 def   Cancer Maternal Grandmother        Lung   Heart disease Maternal Grandfather        CHF   Cancer Maternal Aunt 70       melanoma    SOCIAL HISTORY Social History   Tobacco Use   Smoking status: Never   Smokeless tobacco: Never  Vaping Use   Vaping Use: Never used  Substance Use Topics   Alcohol use: Yes    Comment: rare   Drug use: No         OPHTHALMIC EXAM:  Base Eye Exam     Visual Acuity (Snellen - Linear)       Right Left   Dist cc 20/20 -2      Correction: Contacts         Tonometry (Tonopen, 3:14 PM)       Right Left   Pressure 19          Pupils       Dark Light Shape React   Right 4 3 Round Brisk   Left             Visual Fields (Counting fingers)       Left Right     Full         Extraocular Movement       Right Left    Full     -- -- --  --  --  -- -- --   -- -- --  --  --  -- -- --           Neuro/Psych     Oriented x3: Yes   Mood/Affect: Normal         Dilation     Right eye: 1.0% Mydriacyl, 2.5% Phenylephrine @ 3:14 PM           Slit Lamp and Fundus Exam     Slit Lamp Exam       Right Left   Lids/Lashes Dermatochalasis - upper lid normal   Conjunctiva/Sclera White and quiet prosthetic   Cornea trace PEE    Anterior Chamber Deep and quiet    Iris Round and dilated    Lens 1-2+ Nuclear sclerosis, 1+ Cortical cataract    Vitreous Mild Vitreous syneresis          Fundus Exam       Right Left   Disc Normal    C/D Ratio 0.35    Macula Flat, Good foveal reflex, No heme or edema    Vessels mild attenuation    Periphery Attached, Lattice degeneration  with good laser surrounding at 0730 and 1030-1130, no new RT/RD             IMAGING AND PROCEDURES  Imaging and Procedures for 12/05/17  OCT, Retina - OU - Both Eyes        Right Eye Quality was good. Central Foveal Thickness: 233. Progression has been stable. Findings include normal foveal contour, no IRF, no SRF, vitreomacular adhesion .   Notes *Images captured and stored on drive  Diagnosis / Impression:  OD: NFP, No IRF/SRF OS: no images -- prosthetic, s/p enucleation  Clinical management:  See below  Abbreviations: NFP - Normal foveal profile. CME - cystoid macular edema. PED - pigment epithelial detachment. IRF - intraretinal fluid. SRF - subretinal fluid. EZ - ellipsoid zone. ERM - epiretinal membrane. ORA - outer retinal atrophy. ORT - outer retinal tubulation. SRHM - subretinal hyper-reflective material                ASSESSMENT/PLAN:    ICD-10-CM   1. 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 H/O enucleation of left eyeball  Z90.01     3. 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 Lattice degeneration of peripheral retina, right  H35.411     5. Retinal edema  H35.81 OCT, Retina - OU - Both Eyes    6. Nuclear sclerosis, right  H25.11        1-3. 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 Boyd Kerbs - developed recurrence of choroidal melanoma with vitreous hemorrhage in December 2020 - underwent vitrectomy and then enucleation OS in Jan of 2021 - prosthetic in place -- looks great - recommend polycarbonate glasses - monitor  4,5. Lattice degeneration, OD - patches at 0730 and 1030-1130 - s/p laser retinopexy with Dr. Zigmund Daniel - good laser in place - no new lattice, RT/RD - monitor  6. Nuclear sclerosis, OD - The symptoms of cataract, surgical options, and treatments and risks were discussed with patient. - discussed diagnosis and progression - not yet visually significant - monitor for now    Ophthalmic Meds Ordered this visit:  No  orders of the defined types were placed in this encounter.      Return Return in 1 year DFE, OCT.  There are no Patient Instructions on file for this visit.   Explained the diagnoses, plan, and follow up with the patient and they expressed understanding.  Patient expressed understanding of the importance of proper follow up care.   This document serves as a record of services personally performed by Gardiner Sleeper, MD, PhD. It was created on their behalf by Estill Bakes, COT an ophthalmic technician. The creation of this record is the provider's dictation and/or activities during the visit.    Electronically signed by: Estill Bakes, COT 6.29.22 @ 2:20 PM   Gardiner Sleeper, M.D., Ph.D. Diseases & Surgery of the Retina and [place]  I have reviewed the above documentation for accuracy and completeness, and I agree with the above. Gardiner Sleeper, M.D., Ph.D. 04/01/21 2:20 PM  Abbreviations: M myopia (nearsighted); A astigmatism; H hyperopia (farsighted); P presbyopia; Mrx spectacle prescription;  CTL contact lenses; OD right eye; OS left eye; OU both eyes  XT exotropia; ET esotropia; PEK punctate epithelial  keratitis; PEE punctate epithelial erosions; DES dry eye syndrome; MGD meibomian gland dysfunction; ATs artificial tears; PFAT's preservative free artificial tears; [place] nuclear sclerotic cataract; PSC posterior subcapsular cataract; ERM epi-retinal membrane; PVD posterior vitreous detachment; RD retinal detachment; DM diabetes mellitus; DR diabetic retinopathy; NPDR non-proliferative diabetic retinopathy; PDR proliferative diabetic retinopathy; CSME clinically significant macular edema; DME diabetic macular edema; dbh dot blot hemorrhages; CWS cotton wool spot; POAG primary open angle glaucoma; C/D cup-to-disc ratio; HVF humphrey visual field; GVF goldmann visual field; OCT optical coherence tomography; IOP intraocular pressure; BRVO Branch retinal vein  occlusion; CRVO central retinal vein occlusion; CRAO central retinal artery occlusion; BRAO branch retinal artery occlusion; RT retinal tear; SB scleral buckle; PPV pars plana vitrectomy; VH Vitreous hemorrhage; PRP panretinal laser photocoagulation; IVK intravitreal kenalog; VMT vitreomacular traction; MH Macular hole;  NVD neovascularization of the disc; NVE neovascularization elsewhere; AREDS age related eye disease study; ARMD age related macular degeneration; POAG primary open angle glaucoma; EBMD epithelial/anterior basement membrane dystrophy; ACIOL anterior chamber intraocular lens; IOL intraocular lens; PCIOL posterior chamber intraocular lens; Phaco/IOL phacoemulsification with intraocular lens placement; [place] photorefractive keratectomy; LASIK laser assisted in situ keratomileusis; HTN hypertension; DM diabetes mellitus; COPD chronic obstructive pulmonary disease

## 2021-03-30 ENCOUNTER — Ambulatory Visit (INDEPENDENT_AMBULATORY_CARE_PROVIDER_SITE_OTHER): Payer: 59 | Admitting: Ophthalmology

## 2021-03-30 ENCOUNTER — Other Ambulatory Visit: Payer: Self-pay

## 2021-03-30 DIAGNOSIS — Z9001 Acquired absence of eye: Secondary | ICD-10-CM

## 2021-03-30 DIAGNOSIS — C6932 Malignant neoplasm of left choroid: Secondary | ICD-10-CM

## 2021-03-30 DIAGNOSIS — H3581 Retinal edema: Secondary | ICD-10-CM | POA: Diagnosis not present

## 2021-03-30 DIAGNOSIS — H35411 Lattice degeneration of retina, right eye: Secondary | ICD-10-CM | POA: Diagnosis not present

## 2021-03-30 DIAGNOSIS — Z970 Presence of artificial eye: Secondary | ICD-10-CM | POA: Diagnosis not present

## 2021-03-30 DIAGNOSIS — H2511 Age-related nuclear cataract, right eye: Secondary | ICD-10-CM

## 2021-04-01 ENCOUNTER — Encounter (INDEPENDENT_AMBULATORY_CARE_PROVIDER_SITE_OTHER): Payer: Self-pay | Admitting: Ophthalmology

## 2021-04-05 ENCOUNTER — Other Ambulatory Visit (HOSPITAL_COMMUNITY): Payer: Self-pay

## 2021-04-14 ENCOUNTER — Other Ambulatory Visit (HOSPITAL_COMMUNITY): Payer: Self-pay

## 2021-04-14 MED ORDER — PREDNISONE 50 MG PO TABS
ORAL_TABLET | ORAL | 0 refills | Status: DC
  Filled 2021-04-14: qty 3, 1d supply, fill #0

## 2021-04-22 ENCOUNTER — Other Ambulatory Visit (HOSPITAL_COMMUNITY): Payer: Self-pay

## 2021-04-27 ENCOUNTER — Other Ambulatory Visit (HOSPITAL_COMMUNITY): Payer: Self-pay

## 2021-06-17 ENCOUNTER — Encounter: Payer: 59 | Admitting: Adult Health Nurse Practitioner

## 2021-06-18 NOTE — Progress Notes (Signed)
Complete Physical  Assessment and Plan: Tracy Kidd was seen today for annual exam.  Diagnoses and all orders for this visit:  Encounter for general adult medical examination with abnormal findings Due Annually  Aortic atherosclerosis ([place]) Control blood pressure, cholesterol, glucose, increase exercise.  Hypothyroidism following radioiodine therapy -     TSH - Continue levothyroxine, will adjust dose pending results  Malignant neoplasm of left choroid ([place]) S/p eye enucleation. Continue to follow at [place] and [facility] to monitor liver  Hyperlipidemia, unspecified hyperlipidemia type -     COMPLETE METABOLIC PANEL WITH GFR -     Lipid panel - Continue diet and exercise  Medication management -     CBC with Differential/Platelet -     COMPLETE METABOLIC PANEL WITH GFR -     Magnesium -     Lipid panel -     TSH -     Hemoglobin A1c -     VITAMIN D 25 Hydroxy (Vit-D Deficiency, Fractures) -     Urinalysis, Routine w reflex microscopic -     Microalbumin / creatinine urine ratio -     EKG 12-Lead  Vitamin D deficiency -     VITAMIN D 25 Hydroxy (Vit-D Deficiency, Fractures)  Anemia, unspecified type -     CBC with Differential/Platelet  Anxiety Continue medication and behavior modifications  Screening for hematuria or proteinuria -     Urinalysis, Routine w reflex microscopic -     Microalbumin / creatinine urine ratio  Screening for diabetes mellitus -     Hemoglobin A1c  Screen for colon cancer  Considering cologuard  Screening for ischemic heart disease -     EKG 12-Lead     Discussed med's effects and SE's. Screening labs and tests as requested with regular follow-up as recommended.  HPI 50 y.o. female  presents for a complete physical. Her blood pressure has been controlled at home, today their BP is BP: 132/82   She was diagnosed with ocular melanoma, left eye in 2014 and has been seeing a specialist at [facility], Dr. Rito Ehrlich but now following  with Dr. Daralene Milch, she had radiation and subsequent glaucoma, following with Dr. Celene Squibb.  Had cataract surgery in June 2019.   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 Now having CT AB with contrast q 3 months and following closely with [facility].  She had recent grafting of liposection of AB tissue for her left eye. She gets METS surveillance yearly and is due for CT AB with contrast. Has one next week.   Had Pap with Dr. Marvel Plan.  She is also seeing Dr. [place] every 6 months for skin checks.  She has trouble sleeping and anxiety, was placed on prozac and states it seems to be helping.   BMI is Body mass index is 30.47 kg/m., she is working on diet and exercise. She is doing weight watchers and is down 20 pounds. -Works lab at [employer].  Wt Readings from Last 3 Encounters:  06/21/21 172 lb (78 kg)  06/18/20 176 lb 3.2 oz (79.9 kg)  06/19/19 168 lb 9.6 oz (76.5 kg)   She does not workout. She denies chest pain, shortness of breath, dizziness.  She is not on cholesterol medication and denies myalgias. Her cholesterol is not at goal. The cholesterol last visit was:  Lab Results  Component Value Date   CHOL 202 (H) 06/18/2020  HDL 42 (L) 06/18/2020   LDLCALC 136 (H) 06/18/2020   TRIG 119 06/18/2020   CHOLHDL 4.8 06/18/2020    Last A1C in the office was:  Lab Results  Component Value Date   HGBA1C 5.3 06/18/2020   Patient is on Vitamin D supplement, 10,000 daily, will miss some.  Lab Results  Component Value Date   VD25OH 30 06/18/2020   She has a B12 but does not take it regularly, takes sublingual takes 2 a day.  Lab Results  Component Value Date   MBTDHRCB63 8,453 (H) 06/19/2019   She is on thyroid medication. Her medication was not changed last visit.  She is not on biotin.  Lab Results  Component Value Date   TSH 0.21 (L) 06/18/2020  .  She is not on iron regularlly Lab Results  Component  Value Date   IRON 53 06/18/2020   TIBC 389 06/18/2020   FERRITIN 11 (L) 06/18/2020     Current Medications:   Current Outpatient Medications (Endocrine & Metabolic):    SYNTHROID 646 MCG tablet, TAKE 1 TABLET BY MOUTH DAILY ON AN EMPTY STOMACH WITH ONLY WATER FOR 30 MINUTES. NO ANTACID MEDS, CALCIUM OR MAGNESIUM FOR 4 HOURS. AVOID BIOTIN.   predniSONE (DELTASONE) 50 MG tablet, Take 1 tablet by mouth every 6 hours for three total doses. Take one 13 hours, 7 hours and 1 hour prior to the study. (Patient not taking: Reported on 06/21/2021)   Current Outpatient Medications (Respiratory):    levocetirizine (XYZAL) 5 MG tablet, Take 5 mg by mouth every evening.   Cetirizine HCl 10 MG CAPS, Zyrtec (Patient not taking: Reported on 03/30/2021)   Current Outpatient Medications (Hematological):    Cyanocobalamin (VITAMIN B-12 PO), Take 1 tablet by mouth daily.   Ferrous Sulfate (IRON) 28 MG TABS, Take by mouth daily.   vitamin B-12 (CYANOCOBALAMIN) 100 MCG tablet, Vitamin B12  Current Outpatient Medications (Other):    carboxymethylcellulose (REFRESH PLUS) 0.5 % SOLN, Apply to eye.   cholecalciferol (VITAMIN D) 1000 UNITS tablet, Take 2,000 Units by mouth daily.   FLUoxetine (PROZAC) 20 MG capsule, Take  1 capsule  Daily  for Mood   MELATONIN PO, Take 1 tablet by mouth at bedtime.   TURMERIC PO, Take by mouth daily.   KRILL OIL PO, Take by mouth daily. (Patient not taking: Reported on 06/21/2021)  Health Maintenance:   Immunization History  Administered Date(s) Administered   Influenza,inj,Quad PF,6+ Mos 05/28/2015   PFIZER(Purple Top)SARS-COV-2 Vaccination 01/17/2020, 02/14/2020   Tdap 08/12/2014   TDAP: 2015  Pneumovax: N/A Prevnar 13: N/A Flu vaccine: 2014 Zostavax: N/A COVID vaccines complete Pap: 2016 q 5 years due this year, to call Dr. Marvel Plan to schedule MGM: 2020, due this year, gets at Dr. Marvel Plan calling this week Colonoscopy: due age 74, considering  cologuard EGD:  Medical History:  Past Medical History:  Diagnosis Date   Arthritis    hands and feet   Cancer ([place])    ocular melanoma   Hypothyroidism    PONV (postoperative nausea and vomiting)    past surgery Scop patch and propofol helped   Thyroid disease    Allergies Allergies  Allergen Reactions   Povidone-Iodine Swelling    Only in EYE, tolerates on skin   Latanoprost Other (See Comments)    Burns and stings, severe tearing   Oxycodone Nausea And Vomiting    Can take morphine and possibly Vicodin Can take morphine and possibly Vicodin   SURGICAL HISTORY She  has a past surgical history that includes Wisdom tooth extraction; Eye surgery; and laparoscopy (N/A, 09/13/2013).   FAMILY HISTORY Her family history includes Arthritis in her mother; Autoimmune disease in her father; Cancer in her maternal grandmother and mother; Cancer (age of onset: 59) in her maternal aunt; Heart disease in her maternal grandfather; Hyperlipidemia in her father; Nephrolithiasis in her father; Varicose Veins in her mother.   SOCIAL HISTORY She  reports that she has never smoked. She has never used smokeless tobacco. She reports current alcohol use. She reports that she does not use drugs.  Review of Systems  Constitutional: Negative.  Negative for chills and fever.  HENT:  Negative for congestion (allergies, on allegra), ear discharge, ear pain, hearing loss, nosebleeds, sinus pain, sore throat and tinnitus.   Eyes:  Negative for blurred vision, double vision, photophobia, pain, discharge and redness.  Respiratory: Negative.  Negative for cough, hemoptysis, sputum production, shortness of breath, wheezing and stridor.   Cardiovascular:  Positive for palpitations (rare). Negative for chest pain and leg swelling.  Gastrointestinal: Negative.  Negative for abdominal pain, constipation, diarrhea, heartburn, nausea and vomiting.  Genitourinary: Negative.  Negative for dysuria and urgency.   Musculoskeletal:  Negative for back pain, falls, joint pain, myalgias and neck pain.  Skin: Negative.  Negative for itching and rash.  Neurological: Negative.  Negative for dizziness, tingling, tremors, weakness and headaches.  Endo/Heme/Allergies: Negative.  Does not bruise/bleed easily.  Psychiatric/Behavioral: Negative.  Negative for depression and suicidal ideas. The patient is not nervous/anxious and does not have insomnia.    Physical Exam: Estimated body mass index is 30.47 kg/m as calculated from the following:   Height as of this encounter: _0  (1.6 m).   Weight as of this encounter: 172 lb (78 kg). BP 132/82   Pulse 80   Temp 97.7 F (36.5 C)   Ht _1  (1.6 m)   Wt 172 lb (78 kg)   SpO2 98%   BMI 30.47 kg/m  General Appearance: Well nourished, in no apparent distress. Eyes: PERRLA, EOMs, left  no ptosis noted, conjunctiva no swelling or erythema Sinuses: No Frontal/maxillary tenderness ENT/Mouth: Ext aud canals clear, normal light reflex with TMs without erythema, bulging.  Good dentition. No erythema, swelling, or exudate on post pharynx. Tonsils not swollen or erythematous. Hearing normal.  Neck: Supple, thyroid normal. No bruits Respiratory: Respiratory effort normal, BS equal bilaterally without rales, rhonchi, wheezing or stridor. Cardio: RRR without murmurs, rubs or gallops. Brisk peripheral pulses without edema.  Chest: symmetric, with normal excursions and percussion. Breasts: defer Abdomen: Soft, +BS. Non tender, no guarding, rebound, hernias, masses, or organomegaly.  Lymphatics: Non tender without lymphadenopathy.  Genitourinary: defer Musculoskeletal: Full ROM all peripheral extremities,5/5 strength, Skin:  Warm, dry without rashes, lesions, ecchymosis.  Neuro: Cranial nerves intact, reflexes equal bilaterally. Normal muscle tone, no cerebellar symptoms. Sensation intact.  Psych: Awake and oriented X 3, normal affect, Insight and Judgment appropriate.    EKG: No ST changes, normal sinus rhythm   Teddie Curd [place] Minteer 3:18 PM

## 2021-06-21 ENCOUNTER — Encounter: Payer: Self-pay | Admitting: Nurse Practitioner

## 2021-06-21 ENCOUNTER — Other Ambulatory Visit: Payer: Self-pay

## 2021-06-21 ENCOUNTER — Ambulatory Visit (INDEPENDENT_AMBULATORY_CARE_PROVIDER_SITE_OTHER): Payer: 59 | Admitting: Nurse Practitioner

## 2021-06-21 VITALS — BP 132/82 | HR 80 | Temp 97.7°F | Ht 63.0 in | Wt 172.0 lb

## 2021-06-21 DIAGNOSIS — L409 Psoriasis, unspecified: Secondary | ICD-10-CM

## 2021-06-21 DIAGNOSIS — Z0000 Encounter for general adult medical examination without abnormal findings: Secondary | ICD-10-CM | POA: Diagnosis not present

## 2021-06-21 DIAGNOSIS — Z131 Encounter for screening for diabetes mellitus: Secondary | ICD-10-CM

## 2021-06-21 DIAGNOSIS — I700 Atherosclerosis of aorta: Secondary | ICD-10-CM

## 2021-06-21 DIAGNOSIS — Z79899 Other long term (current) drug therapy: Secondary | ICD-10-CM

## 2021-06-21 DIAGNOSIS — E559 Vitamin D deficiency, unspecified: Secondary | ICD-10-CM

## 2021-06-21 DIAGNOSIS — F419 Anxiety disorder, unspecified: Secondary | ICD-10-CM

## 2021-06-21 DIAGNOSIS — E890 Postprocedural hypothyroidism: Secondary | ICD-10-CM

## 2021-06-21 DIAGNOSIS — C6932 Malignant neoplasm of left choroid: Secondary | ICD-10-CM | POA: Diagnosis not present

## 2021-06-21 DIAGNOSIS — D649 Anemia, unspecified: Secondary | ICD-10-CM

## 2021-06-21 DIAGNOSIS — Z1389 Encounter for screening for other disorder: Secondary | ICD-10-CM | POA: Diagnosis not present

## 2021-06-21 DIAGNOSIS — E785 Hyperlipidemia, unspecified: Secondary | ICD-10-CM

## 2021-06-21 DIAGNOSIS — I10 Essential (primary) hypertension: Secondary | ICD-10-CM

## 2021-06-21 DIAGNOSIS — E538 Deficiency of other specified B group vitamins: Secondary | ICD-10-CM

## 2021-06-21 DIAGNOSIS — Z0001 Encounter for general adult medical examination with abnormal findings: Secondary | ICD-10-CM

## 2021-06-21 DIAGNOSIS — Z1211 Encounter for screening for malignant neoplasm of colon: Secondary | ICD-10-CM

## 2021-06-21 DIAGNOSIS — Z136 Encounter for screening for cardiovascular disorders: Secondary | ICD-10-CM | POA: Diagnosis not present

## 2021-06-21 NOTE — Patient Instructions (Signed)

## 2021-06-22 LAB — HEMOGLOBIN A1C
Hgb A1c MFr Bld: 5.3 % of total Hgb (ref <5.7)
Mean Plasma Glucose: 105 mg/dL
eAG (mmol/L): 5.8 mmol/L

## 2021-06-22 LAB — LIPID PANEL
Cholesterol: 184 mg/dL (ref <200)
HDL: 43 mg/dL — ABNORMAL LOW (ref >=50)
LDL Cholesterol (Calc): 120 mg/dL (calc) — ABNORMAL HIGH
Non-HDL Cholesterol (Calc): 141 mg/dL (calc) — ABNORMAL HIGH (ref <130)
Total CHOL/HDL Ratio: 4.3 (calc) (ref <5.0)
Triglycerides: 100 mg/dL (ref <150)

## 2021-06-22 LAB — CBC WITH DIFFERENTIAL/PLATELET
Absolute Monocytes: 302 cells/uL (ref 200–950)
Basophils Absolute: 34 cells/uL (ref 0–200)
Basophils Relative: 0.5 %
Eosinophils Absolute: 40 cells/uL (ref 15–500)
Eosinophils Relative: 0.6 %
HCT: 39.1 % (ref 35.0–45.0)
Hemoglobin: 12.6 g/dL (ref 11.7–15.5)
Lymphs Abs: 1796 cells/uL (ref 850–3900)
MCH: 28.2 pg (ref 27.0–33.0)
MCHC: 32.2 g/dL (ref 32.0–36.0)
MCV: 87.5 fL (ref 80.0–100.0)
MPV: 10.1 fL (ref 7.5–12.5)
Monocytes Relative: 4.5 %
Neutro Abs: 4529 cells/uL (ref 1500–7800)
Neutrophils Relative %: 67.6 %
Platelets: 231 10*3/uL (ref 140–400)
RBC: 4.47 10*6/uL (ref 3.80–5.10)
RDW: 13.2 % (ref 11.0–15.0)
Total Lymphocyte: 26.8 %
WBC: 6.7 10*3/uL (ref 3.8–10.8)

## 2021-06-22 LAB — COMPLETE METABOLIC PANEL WITH GFR
AG Ratio: 1.6 (calc) (ref 1.0–2.5)
ALT: 8 U/L (ref 6–29)
AST: 11 U/L (ref 10–35)
Albumin: 4.2 g/dL (ref 3.6–5.1)
Alkaline phosphatase (APISO): 95 U/L (ref 31–125)
BUN: 13 mg/dL (ref 7–25)
CO2: 28 mmol/L (ref 20–32)
Calcium: 8.9 mg/dL (ref 8.6–10.2)
Chloride: 104 mmol/L (ref 98–110)
Creat: 0.63 mg/dL (ref 0.50–0.99)
Globulin: 2.6 g/dL (calc) (ref 1.9–3.7)
Glucose, Bld: 80 mg/dL (ref 65–99)
Potassium: 3.9 mmol/L (ref 3.5–5.3)
Sodium: 139 mmol/L (ref 135–146)
Total Bilirubin: 0.4 mg/dL (ref 0.2–1.2)
Total Protein: 6.8 g/dL (ref 6.1–8.1)
eGFR: 109 mL/min/{1.73_m2} (ref >=60)

## 2021-06-22 LAB — MICROALBUMIN / CREATININE URINE RATIO
Creatinine, Urine: 21 mg/dL (ref 20–275)
Microalb, Ur: <0.2 mg/dL

## 2021-06-22 LAB — URINALYSIS, ROUTINE W REFLEX MICROSCOPIC
Bilirubin Urine: NEGATIVE
Glucose, UA: NEGATIVE
Hgb urine dipstick: NEGATIVE
Ketones, ur: NEGATIVE
Leukocytes,Ua: NEGATIVE
Nitrite: NEGATIVE
Protein, ur: NEGATIVE
Specific Gravity, Urine: 1.006 (ref 1.001–1.035)
pH: 6.5 (ref 5.0–8.0)

## 2021-06-22 LAB — GAMMA GT: GGT: 9 U/L (ref 3–55)

## 2021-06-22 LAB — MAGNESIUM: Magnesium: 1.9 mg/dL (ref 1.5–2.5)

## 2021-06-22 LAB — VITAMIN D 25 HYDROXY (VIT D DEFICIENCY, FRACTURES): Vit D, 25-Hydroxy: 31 ng/mL (ref 30–100)

## 2021-06-22 LAB — TSH: TSH: 2.03 mIU/L

## 2021-06-30 DIAGNOSIS — C6930 Malignant neoplasm of unspecified choroid: Secondary | ICD-10-CM | POA: Diagnosis not present

## 2021-06-30 DIAGNOSIS — R911 Solitary pulmonary nodule: Secondary | ICD-10-CM | POA: Diagnosis not present

## 2021-07-05 ENCOUNTER — Other Ambulatory Visit (HOSPITAL_COMMUNITY): Payer: Self-pay

## 2021-07-05 DIAGNOSIS — C6932 Malignant neoplasm of left choroid: Secondary | ICD-10-CM | POA: Diagnosis not present

## 2021-07-05 MED ORDER — PREDNISONE 50 MG PO TABS
ORAL_TABLET | ORAL | 0 refills | Status: DC
  Filled 2021-07-05: qty 3, 1d supply, fill #0

## 2021-07-13 ENCOUNTER — Other Ambulatory Visit (HOSPITAL_COMMUNITY): Payer: Self-pay

## 2021-07-16 ENCOUNTER — Other Ambulatory Visit: Payer: Self-pay | Admitting: Internal Medicine

## 2021-07-16 ENCOUNTER — Other Ambulatory Visit (HOSPITAL_COMMUNITY): Payer: Self-pay

## 2021-07-16 DIAGNOSIS — F429 Obsessive-compulsive disorder, unspecified: Secondary | ICD-10-CM

## 2021-07-16 MED ORDER — FLUOXETINE HCL 20 MG PO CAPS
20.0000 mg | ORAL_CAPSULE | Freq: Every day | ORAL | 1 refills | Status: DC
  Filled 2021-07-16: qty 90, 90d supply, fill #0
  Filled 2021-10-21: qty 90, 90d supply, fill #1

## 2021-07-30 ENCOUNTER — Other Ambulatory Visit (HOSPITAL_COMMUNITY): Payer: Self-pay

## 2021-07-30 ENCOUNTER — Other Ambulatory Visit: Payer: Self-pay | Admitting: Adult Health

## 2021-07-30 MED ORDER — SYNTHROID 112 MCG PO TABS
112.0000 ug | ORAL_TABLET | Freq: Every day | ORAL | 1 refills | Status: DC
  Filled 2021-07-30: qty 90, 90d supply, fill #0
  Filled 2021-11-04: qty 90, 90d supply, fill #1

## 2021-08-24 DIAGNOSIS — H5213 Myopia, bilateral: Secondary | ICD-10-CM | POA: Diagnosis not present

## 2021-10-13 DIAGNOSIS — Z4420 Encounter for fitting and adjustment of artificial eye, unspecified: Secondary | ICD-10-CM | POA: Diagnosis not present

## 2021-10-18 DIAGNOSIS — C6932 Malignant neoplasm of left choroid: Secondary | ICD-10-CM | POA: Diagnosis not present

## 2021-10-18 DIAGNOSIS — R911 Solitary pulmonary nodule: Secondary | ICD-10-CM | POA: Diagnosis not present

## 2021-10-19 DIAGNOSIS — C6932 Malignant neoplasm of left choroid: Secondary | ICD-10-CM | POA: Diagnosis not present

## 2021-10-21 ENCOUNTER — Other Ambulatory Visit (HOSPITAL_COMMUNITY): Payer: Self-pay

## 2021-11-04 ENCOUNTER — Other Ambulatory Visit (HOSPITAL_COMMUNITY): Payer: Self-pay

## 2021-11-22 DIAGNOSIS — Z130 Encounter for screening for diseases of the blood and blood-forming organs and certain disorders involving the immune mechanism: Secondary | ICD-10-CM | POA: Diagnosis not present

## 2021-11-22 DIAGNOSIS — Z1389 Encounter for screening for other disorder: Secondary | ICD-10-CM | POA: Diagnosis not present

## 2021-11-22 DIAGNOSIS — Z01419 Encounter for gynecological examination (general) (routine) without abnormal findings: Secondary | ICD-10-CM | POA: Diagnosis not present

## 2021-11-24 DIAGNOSIS — Z1231 Encounter for screening mammogram for malignant neoplasm of breast: Secondary | ICD-10-CM | POA: Diagnosis not present

## 2022-01-20 ENCOUNTER — Other Ambulatory Visit (HOSPITAL_COMMUNITY): Payer: Self-pay

## 2022-01-20 ENCOUNTER — Other Ambulatory Visit: Payer: Self-pay | Admitting: Adult Health

## 2022-01-20 DIAGNOSIS — F429 Obsessive-compulsive disorder, unspecified: Secondary | ICD-10-CM

## 2022-01-20 MED ORDER — FLUOXETINE HCL 20 MG PO CAPS
20.0000 mg | ORAL_CAPSULE | Freq: Every day | ORAL | 1 refills | Status: DC
  Filled 2022-01-20: qty 90, 90d supply, fill #0
  Filled 2022-05-04: qty 90, 90d supply, fill #1

## 2022-02-14 DIAGNOSIS — C6932 Malignant neoplasm of left choroid: Secondary | ICD-10-CM | POA: Diagnosis not present

## 2022-02-14 DIAGNOSIS — Z85840 Personal history of malignant neoplasm of eye: Secondary | ICD-10-CM | POA: Diagnosis not present

## 2022-02-14 DIAGNOSIS — R918 Other nonspecific abnormal finding of lung field: Secondary | ICD-10-CM | POA: Diagnosis not present

## 2022-02-15 ENCOUNTER — Other Ambulatory Visit: Payer: Self-pay | Admitting: Adult Health

## 2022-02-15 ENCOUNTER — Other Ambulatory Visit (HOSPITAL_COMMUNITY): Payer: Self-pay

## 2022-02-15 MED ORDER — SYNTHROID 112 MCG PO TABS
112.0000 ug | ORAL_TABLET | Freq: Every day | ORAL | 1 refills | Status: DC
  Filled 2022-02-15: qty 90, 90d supply, fill #0
  Filled 2022-05-24: qty 90, 90d supply, fill #1

## 2022-02-16 DIAGNOSIS — R911 Solitary pulmonary nodule: Secondary | ICD-10-CM | POA: Diagnosis not present

## 2022-02-16 DIAGNOSIS — C6932 Malignant neoplasm of left choroid: Secondary | ICD-10-CM | POA: Diagnosis not present

## 2022-02-28 ENCOUNTER — Telehealth: Payer: 59 | Admitting: Physician Assistant

## 2022-02-28 DIAGNOSIS — B9689 Other specified bacterial agents as the cause of diseases classified elsewhere: Secondary | ICD-10-CM

## 2022-02-28 DIAGNOSIS — J019 Acute sinusitis, unspecified: Secondary | ICD-10-CM | POA: Diagnosis not present

## 2022-02-28 MED ORDER — FLUTICASONE PROPIONATE 50 MCG/ACT NA SUSP: 2.0000 | Freq: Every day | NASAL | 0 refills | Status: DC

## 2022-02-28 MED ORDER — AMOXICILLIN-POT CLAVULANATE 875-125 MG PO TABS: 1.0000 | ORAL_TABLET | Freq: Two times a day (BID) | ORAL | 0 refills | Status: DC

## 2022-02-28 NOTE — Patient Instructions (Signed)
Tracy Kidd, thank you for joining Mar Daring, PA-C for today's virtual visit.  While this provider is not your primary care provider (PCP), if your PCP is located in our provider database this encounter information will be shared with them immediately following your visit.  Consent: (Patient) Tracy Kidd provided verbal consent for this virtual visit at the beginning of the encounter.  Current Medications:  Current Outpatient Medications:    amoxicillin-clavulanate (AUGMENTIN) 875-125 MG tablet, Take 1 tablet by mouth 2 (two) times daily., Disp: 14 tablet, Rfl: 0   fluticasone (FLONASE) 50 MCG/ACT nasal spray, Place 2 sprays into both nostrils daily., Disp: 16 g, Rfl: 0   carboxymethylcellulose (REFRESH PLUS) 0.5 % SOLN, Apply to eye., Disp: , Rfl:    Cetirizine HCl 10 MG CAPS, Zyrtec (Patient not taking: Reported on 03/30/2021), Disp: , Rfl:    cholecalciferol (VITAMIN D) 1000 UNITS tablet, Take 2,000 Units by mouth daily., Disp: , Rfl:    Cyanocobalamin (VITAMIN B-12 PO), Take 1 tablet by mouth daily., Disp: , Rfl:    Ferrous Sulfate (IRON) 28 MG TABS, Take by mouth daily., Disp: , Rfl:    FLUoxetine (PROZAC) 20 MG capsule, Take 1 capsule (20 mg total) by mouth daily for mood, Disp: 90 capsule, Rfl: 1   levocetirizine (XYZAL) 5 MG tablet, Take 5 mg by mouth every evening., Disp: , Rfl:    MELATONIN PO, Take 1 tablet by mouth at bedtime., Disp: , Rfl:    predniSONE (DELTASONE) 50 MG tablet, Take 1 tablet by mouth every 6 hours for three total doses. Take them 13 hours, 7 hours and 1 hour prior to the study., Disp: 3 tablet, Rfl: 0   SYNTHROID 112 MCG tablet, Take 1 tablet (112 mcg total) by mouth daily on an empty stomach with water at least 30 minutes before a meal.  NO ANTACID MEDS, CALCIUM OR MAGNESIUM FOR 4 HOURS. AVOID BIOTIN., Disp: 90 tablet, Rfl: 1   TURMERIC PO, Take by mouth daily., Disp: , Rfl:    vitamin B-12 (CYANOCOBALAMIN) 100 MCG tablet, Vitamin B12, Disp: ,  Rfl:    Medications ordered in this encounter:  Meds ordered this encounter  Medications   amoxicillin-clavulanate (AUGMENTIN) 875-125 MG tablet    Sig: Take 1 tablet by mouth 2 (two) times daily.    Dispense:  14 tablet    Refill:  0    Order Specific Question:   Supervising Provider    Answer:   MILLER, BRIAN [[id number]]   fluticasone (FLONASE) 50 MCG/ACT nasal spray    Sig: Place 2 sprays into both nostrils daily.    Dispense:  16 g    Refill:  0    Order Specific Question:   Supervising Provider    Answer:   Sabra Heck, [place]     *If you need refills on other medications prior to your next appointment, please contact your pharmacy*  Follow-Up: Call back or seek an in-person evaluation if the symptoms worsen or if the condition fails to improve as anticipated.  Other Instructions Sinus Infection, Adult A sinus infection, also called sinusitis, is inflammation of your sinuses. Sinuses are hollow spaces in the bones around your face. Your sinuses are located: Around your eyes. In the middle of your forehead. Behind your nose. In your cheekbones. Mucus normally drains out of your sinuses. When your nasal tissues become inflamed or swollen, mucus can become trapped or blocked. This allows bacteria, viruses, and fungi to grow, which leads to  infection. Most infections of the sinuses are caused by a virus. A sinus infection can develop quickly. It can last for up to 4 weeks (acute) or for more than 12 weeks (chronic). A sinus infection often develops after a cold. What are the causes? This condition is caused by anything that creates swelling in the sinuses or stops mucus from draining. This includes: Allergies. Asthma. Infection from bacteria or viruses. Deformities or blockages in your nose or sinuses. Abnormal growths in the nose (nasal polyps). Pollutants, such as chemicals or irritants in the air. Infection from fungi. This is rare. What increases the risk? You are more  likely to develop this condition if you: Have a weak body defense system (immune system). Do a lot of swimming or diving. Overuse nasal sprays. Smoke. What are the signs or symptoms? The main symptoms of this condition are pain and a feeling of pressure around the affected sinuses. Other symptoms include: Stuffy nose or congestion that makes it difficult to breathe through your nose. Thick yellow or greenish drainage from your nose. Tenderness, swelling, and warmth over the affected sinuses. A cough that may get worse at night. Decreased sense of smell and taste. Extra mucus that collects in the throat or the back of the nose (postnasal drip) causing a sore throat or bad breath. Tiredness (fatigue). Fever. How is this diagnosed? This condition is diagnosed based on: Your symptoms. Your medical history. A physical exam. Tests to find out if your condition is acute or chronic. This may include: Checking your nose for nasal polyps. Viewing your sinuses using a device that has a light (endoscope). Testing for allergies or bacteria. Imaging tests, such as an MRI or CT scan. In rare cases, a bone biopsy may be done to rule out more serious types of fungal sinus disease. How is this treated? Treatment for a sinus infection depends on the cause and whether your condition is chronic or acute. If caused by a virus, your symptoms should go away on their own within 10 days. You may be given medicines to relieve symptoms. They include: Medicines that shrink swollen nasal passages (decongestants). A spray that eases inflammation of the nostrils (topical intranasal corticosteroids). Rinses that help get rid of thick mucus in your nose (nasal saline washes). Medicines that treat allergies (antihistamines). Over-the-counter pain relievers. If caused by bacteria, your health care provider may recommend waiting to see if your symptoms improve. Most bacterial infections will get better without  antibiotic medicine. You may be given antibiotics if you have: A severe infection. A weak immune system. If caused by narrow nasal passages or nasal polyps, surgery may be needed. Follow these instructions at home: Medicines Take, use, or apply over-the-counter and prescription medicines only as told by your health care provider. These may include nasal sprays. If you were prescribed an antibiotic medicine, take it as told by your health care provider. Do not stop taking the antibiotic even if you start to feel better. Hydrate and humidify  Drink enough fluid to keep your urine pale yellow. Staying hydrated will help to thin your mucus. Use a cool mist humidifier to keep the humidity level in your home above 50%. Inhale steam for 10-15 minutes, 3-4 times a day, or as told by your health care provider. You can do this in the bathroom while a hot shower is running. Limit your exposure to cool or dry air. Rest Rest as much as possible. Sleep with your head raised (elevated). Make sure you get enough  sleep each night. General instructions  Apply a warm, moist washcloth to your face 3-4 times a day or as told by your health care provider. This will help with discomfort. Use nasal saline washes as often as told by your health care provider. Wash your hands often with soap and water to reduce your exposure to germs. If soap and water are not available, use hand sanitizer. Do not smoke. Avoid being around people who are smoking (secondhand smoke). Keep all follow-up visits. This is important. Contact a health care provider if: You have a fever. Your symptoms get worse. Your symptoms do not improve within 10 days. Get help right away if: You have a severe headache. You have persistent vomiting. You have severe pain or swelling around your face or eyes. You have vision problems. You develop confusion. Your neck is stiff. You have trouble breathing. These symptoms may be an emergency. Get  help right away. Call 911. Do not wait to see if the symptoms will go away. Do not drive yourself to the hospital. Summary A sinus infection is soreness and inflammation of your sinuses. Sinuses are hollow spaces in the bones around your face. This condition is caused by nasal tissues that become inflamed or swollen. The swelling traps or blocks the flow of mucus. This allows bacteria, viruses, and fungi to grow, which leads to infection. If you were prescribed an antibiotic medicine, take it as told by your health care provider. Do not stop taking the antibiotic even if you start to feel better. Keep all follow-up visits. This is important. This information is not intended to replace advice given to you by your health care provider. Make sure you discuss any questions you have with your health care provider. Document Revised: 08/17/2021 Document Reviewed: 08/17/2021 Elsevier Patient Education  [place].    If you have been instructed to have an in-person evaluation today at a local Urgent Care facility, please use the link below. It will take you to a list of all of our available [facility], including address, phone number and hours of operation. Please do not delay care.  [facility]  If you or a family member do not have a primary care provider, use the link below to schedule a visit and establish care. When you choose a [facility] primary care physician or advanced practice provider, you gain a long-term partner in health. Find a Primary Care Provider  Learn more about 's in-office and virtual care options: [facility]

## 2022-02-28 NOTE — Progress Notes (Signed)
Virtual Visit Consent   [name], you are scheduled for a virtual visit with a [facility] provider today. Just as with appointments in the office, your consent must be obtained to participate. Your consent will be active for this visit and any virtual visit you may have with one of our providers in the next 365 days. If you have a MyChart account, a copy of this consent can be sent to you electronically.  As this is a virtual visit, video technology does not allow for your provider to perform a traditional examination. This may limit your provider's ability to fully assess your condition. If your provider identifies any concerns that need to be evaluated in person or the need to arrange testing (such as labs, EKG, etc.), we will make arrangements to do so. Although advances in technology are sophisticated, we cannot ensure that it will always work on either your end or our end. If the connection with a video visit is poor, the visit may have to be switched to a telephone visit. With either a video or telephone visit, we are not always able to ensure that we have a secure connection.  By engaging in this virtual visit, you consent to the provision of healthcare and authorize for your insurance to be billed (if applicable) for the services provided during this visit. Depending on your insurance coverage, you may receive a charge related to this service.  I need to obtain your verbal consent now. Are you willing to proceed with your visit today? Tracy Kidd has provided verbal consent on 02/28/2022 for a virtual visit (video or telephone). Mar Daring, PA-C  Date: 02/28/2022 9:13 AM  Virtual Visit via Video Note   I, Mar Daring, connected with  Tracy Kidd  ([id number], Mar 14, 1971) on 02/28/22 at  9:00 AM EDT by a video-enabled telemedicine application and verified that I am speaking with the correct person using two identifiers.  Location: Patient: Virtual Visit Location  Patient: Home Provider: Virtual Visit Location Provider: Home Office   I discussed the limitations of evaluation and management by telemedicine and the availability of in person appointments. The patient expressed understanding and agreed to proceed.    History of Present Illness: Tracy Kidd is a 51 y.o. who identifies as a female who was assigned female at birth, and is being seen today for possible sinus infection.  HPI: Sinusitis This is a new problem. The current episode started in the past 7 days. The problem has been gradually worsening since onset. The maximum temperature recorded prior to her arrival was 100.4 - 100.9 F (100.6, temp this morning was 99.1). The fever has been present for 1 to 2 days. The pain is moderate. Associated symptoms include chills, congestion, ear pain (left ear), headaches and sinus pressure. Pertinent negatives include no coughing, hoarse voice, shortness of breath or sore throat. (Myalgias, does have purulent and bloody discharge) Past treatments include acetaminophen (excedrin). The treatment provided moderate relief.   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    Allergies:  Allergies  Allergen Reactions   Povidone-Iodine Swelling    Only in EYE, tolerates on skin   Latanoprost Other (See Comments)    Burns and stings, severe tearing   Oxycodone Nausea And  Vomiting    Can take morphine and possibly Vicodin Can take morphine and possibly Vicodin   Medications:  Current Outpatient Medications:    amoxicillin-clavulanate (AUGMENTIN) 875-125 MG tablet, Take 1 tablet by mouth 2 (two) times daily., Disp: 14 tablet, Rfl: 0   fluticasone (FLONASE) 50 MCG/ACT nasal spray, Place 2 sprays into both nostrils daily., Disp: 16 g, Rfl: 0   carboxymethylcellulose (REFRESH PLUS) 0.5 % SOLN, Apply to eye., Disp: , Rfl:    Cetirizine HCl 10 MG CAPS, Zyrtec (Patient not taking: Reported on 03/30/2021), Disp: , Rfl:    cholecalciferol (VITAMIN D) 1000 UNITS tablet, Take 2,000 Units by mouth daily., Disp: , Rfl:    Cyanocobalamin (VITAMIN B-12 PO), Take 1 tablet by mouth daily., Disp: , Rfl:    Ferrous Sulfate (IRON) 28 MG TABS, Take by mouth daily., Disp: , Rfl:    FLUoxetine (PROZAC) 20 MG capsule, Take 1 capsule (20 mg total) by mouth daily for mood, Disp: 90 capsule, Rfl: 1   levocetirizine (XYZAL) 5 MG tablet, Take 5 mg by mouth every evening., Disp: , Rfl:    MELATONIN PO, Take 1 tablet by mouth at bedtime., Disp: , Rfl:    predniSONE (DELTASONE) 50 MG tablet, Take 1 tablet by mouth every 6 hours for three total doses. Take them 13 hours, 7 hours and 1 hour prior to the study., Disp: 3 tablet, Rfl: 0   SYNTHROID 112 MCG tablet, Take 1 tablet (112 mcg total) by mouth daily on an empty stomach with water at least 30 minutes before a meal.  NO ANTACID MEDS, CALCIUM OR MAGNESIUM FOR 4 HOURS. AVOID BIOTIN., Disp: 90 tablet, Rfl: 1   TURMERIC PO, Take by mouth daily., Disp: , Rfl:    vitamin B-12 (CYANOCOBALAMIN) 100 MCG tablet, Vitamin B12, Disp: , Rfl:   Observations/Objective: Patient is well-developed, well-nourished in no acute distress.  Resting comfortably at home.  Head is normocephalic, atraumatic.  No labored breathing.  Speech is clear and coherent with logical content.  Patient is alert and oriented at baseline.    Assessment and Plan: 1. Acute  bacterial sinusitis - amoxicillin-clavulanate (AUGMENTIN) 875-125 MG tablet; Take 1 tablet by mouth 2 (two) times daily.  Dispense: 14 tablet; Refill: 0 - fluticasone (FLONASE) 50 MCG/ACT nasal spray; Place 2 sprays into both nostrils daily.  Dispense: 16 g; Refill: 0  - Worsening symptoms that have not responded to OTC medications.  - Will give Augmentin and Flonase - Continue allergy medications.  - Steam and humidifier can help - Stay well hydrated and get plenty of rest.  - Seek in person evaluation if no symptom improvement or if symptoms worsen  Follow Up Instructions: I discussed the assessment and treatment plan with the patient. The patient was provided an opportunity to ask questions and all were answered. The patient agreed with the plan and demonstrated an understanding of the instructions.  A copy of instructions were sent to the patient via MyChart unless otherwise noted below.    The patient was advised to call back or seek an in-person evaluation if the symptoms worsen or if the condition fails to improve as anticipated.  Time:  I spent 15 minutes with the patient via telehealth technology discussing the above problems/concerns.    Mar Daring, PA-C

## 2022-03-01 ENCOUNTER — Telehealth: Payer: 59 | Admitting: Physician Assistant

## 2022-03-01 DIAGNOSIS — C6932 Malignant neoplasm of left choroid: Secondary | ICD-10-CM | POA: Diagnosis not present

## 2022-03-01 DIAGNOSIS — Z83511 Family history of glaucoma: Secondary | ICD-10-CM | POA: Diagnosis not present

## 2022-03-01 DIAGNOSIS — R110 Nausea: Secondary | ICD-10-CM | POA: Diagnosis not present

## 2022-03-01 DIAGNOSIS — T50905A Adverse effect of unspecified drugs, medicaments and biological substances, initial encounter: Secondary | ICD-10-CM | POA: Diagnosis not present

## 2022-03-01 DIAGNOSIS — H35413 Lattice degeneration of retina, bilateral: Secondary | ICD-10-CM | POA: Diagnosis not present

## 2022-03-01 MED ORDER — DOXYCYCLINE HYCLATE 100 MG PO TABS: 100.0000 mg | ORAL_TABLET | Freq: Two times a day (BID) | ORAL | 0 refills | Status: DC

## 2022-03-01 NOTE — Progress Notes (Signed)
Virtual Visit Consent   Tracy Kidd, you are scheduled for a virtual visit with a [facility] provider today. Just as with appointments in the office, your consent must be obtained to participate. Your consent will be active for this visit and any virtual visit you may have with one of our providers in the next 365 days. If you have a MyChart account, a copy of this consent can be sent to you electronically.  As this is a virtual visit, video technology does not allow for your provider to perform a traditional examination. This may limit your provider's ability to fully assess your condition. If your provider identifies any concerns that need to be evaluated in person or the need to arrange testing (such as labs, EKG, etc.), we will make arrangements to do so. Although advances in technology are sophisticated, we cannot ensure that it will always work on either your end or our end. If the connection with a video visit is poor, the visit may have to be switched to a telephone visit. With either a video or telephone visit, we are not always able to ensure that we have a secure connection.  By engaging in this virtual visit, you consent to the provision of healthcare and authorize for your insurance to be billed (if applicable) for the services provided during this visit. Depending on your insurance coverage, you may receive a charge related to this service.  I need to obtain your verbal consent now. Are you willing to proceed with your visit today? Tracy Kidd has provided verbal consent on 03/01/2022 for a virtual visit (video or telephone). Tracy Kidd, [place]  Date: 03/01/2022 5:06 PM  Virtual Visit via Video Note   I, Tracy Kidd, connected with  Tracy Kidd  ([id number], [birth year]) on 03/01/22 at  5:15 PM EDT by a video-enabled telemedicine application and verified that I am speaking with the correct person using two identifiers.  Location: Patient: Virtual Visit Location  Patient: Home Provider: Virtual Visit Location Provider: Home Office   I discussed the limitations of evaluation and management by telemedicine and the availability of in person appointments. The patient expressed understanding and agreed to proceed.    History of Present Illness: Tracy Kidd is a 51 y.o. who identifies as a female who was assigned female at birth, and is being seen today for medication reaction after starting Augmentin for acute bacterial sinusitis. Notes substantial nausea with episodes of non-bloody emesis after starting the Augmentin even when taking with food. Denies diarrhea, indigestion. Is following all of the other instructions given at time of visit yesterday for her sinusitis.   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    Allergies:  Allergies  Allergen Reactions   Povidone-Iodine Swelling    Only in EYE, tolerates on skin   Latanoprost Other (See Comments)    Burns and stings, severe tearing   Oxycodone Nausea And Vomiting    Can take morphine and possibly  Vicodin Can take morphine and possibly Vicodin   Medications:  Current Outpatient Medications:    doxycycline (VIBRA-TABS) 100 MG tablet, Take 1 tablet (100 mg total) by mouth 2 (two) times daily., Disp: 20 tablet, Rfl: 0   carboxymethylcellulose (REFRESH PLUS) 0.5 % SOLN, Apply to eye., Disp: , Rfl:    Cetirizine HCl 10 MG CAPS, Zyrtec (Patient  not taking: Reported on 03/30/2021), Disp: , Rfl:    cholecalciferol (VITAMIN D) 1000 UNITS tablet, Take 2,000 Units by mouth daily., Disp: , Rfl:    Cyanocobalamin (VITAMIN B-12 PO), Take 1 tablet by mouth daily., Disp: , Rfl:    Ferrous Sulfate (IRON) 28 MG TABS, Take by mouth daily., Disp: , Rfl:    FLUoxetine (PROZAC) 20 MG capsule, Take 1 capsule (20 mg total) by mouth daily for mood, Disp: 90 capsule, Rfl: 1   fluticasone (FLONASE) 50 MCG/ACT nasal spray, Place 2 sprays into both nostrils daily., Disp: 16 g, Rfl: 0   levocetirizine (XYZAL) 5 MG tablet, Take 5 mg by mouth every evening., Disp: , Rfl:    MELATONIN PO, Take 1 tablet by mouth at bedtime., Disp: , Rfl:    predniSONE (DELTASONE) 50 MG tablet, Take 1 tablet by mouth every 6 hours for three total doses. Take them 13 hours, 7 hours and 1 hour prior to the study., Disp: 3 tablet, Rfl: 0   SYNTHROID 112 MCG tablet, Take 1 tablet (112 mcg total) by mouth daily on an empty stomach with water at least 30 minutes before a meal.  NO ANTACID MEDS, CALCIUM OR MAGNESIUM FOR 4 HOURS. AVOID BIOTIN., Disp: 90 tablet, Rfl: 1   TURMERIC PO, Take by mouth daily., Disp: , Rfl:    vitamin B-12 (CYANOCOBALAMIN) 100 MCG tablet, Vitamin B12, Disp: , Rfl:   Observations/Objective: Patient is well-developed, well-nourished in no acute distress.  Resting comfortably at home.  Head is normocephalic, atraumatic.  No labored breathing. Speech is clear and coherent with logical content.  Patient is alert and oriented at baseline.   Assessment and Plan: 1. Adverse effect of drug, initial encounter - doxycycline (VIBRA-TABS) 100 MG tablet; Take 1 tablet (100 mg total) by mouth 2 (two) times daily.  Dispense: 20 tablet; Refill: 0  Stop Augmentin. Start probiotic, BRAT diet. Doxycycline for sinusitis. Strict follow-up precautions discussed.   Follow Up Instructions: I discussed the assessment and treatment plan with the patient. The patient was provided an  opportunity to ask questions and all were answered. The patient agreed with the plan and demonstrated an understanding of the instructions.  A copy of instructions were sent to the patient via MyChart unless otherwise noted below.   The patient was advised to call back or seek an in-person evaluation if the symptoms worsen or if the condition fails to improve as anticipated.  Time:  I spent 10 minutes with the patient via telehealth technology discussing the above problems/concerns.    Tracy Rio, PA-C

## 2022-03-01 NOTE — Patient Instructions (Addendum)
  Tracy Kidd, thank you for joining [name], PA-C for today's virtual visit.  While this provider is not your primary care provider (PCP), if your PCP is located in our provider database this encounter information will be shared with them immediately following your visit.  Consent: (Patient) Tracy Kidd provided verbal consent for this virtual visit at the beginning of the encounter.  Current Medications:  Current Outpatient Medications:    amoxicillin-clavulanate (AUGMENTIN) 875-125 MG tablet, Take 1 tablet by mouth 2 (two) times daily., Disp: 14 tablet, Rfl: 0   carboxymethylcellulose (REFRESH PLUS) 0.5 % SOLN, Apply to eye., Disp: , Rfl:    Cetirizine HCl 10 MG CAPS, Zyrtec (Patient not taking: Reported on 03/30/2021), Disp: , Rfl:    cholecalciferol (VITAMIN D) 1000 UNITS tablet, Take 2,000 Units by mouth daily., Disp: , Rfl:    Cyanocobalamin (VITAMIN B-12 PO), Take 1 tablet by mouth daily., Disp: , Rfl:    Ferrous Sulfate (IRON) 28 MG TABS, Take by mouth daily., Disp: , Rfl:    FLUoxetine (PROZAC) 20 MG capsule, Take 1 capsule (20 mg total) by mouth daily for mood, Disp: 90 capsule, Rfl: 1   fluticasone (FLONASE) 50 MCG/ACT nasal spray, Place 2 sprays into both nostrils daily., Disp: 16 g, Rfl: 0   levocetirizine (XYZAL) 5 MG tablet, Take 5 mg by mouth every evening., Disp: , Rfl:    MELATONIN PO, Take 1 tablet by mouth at bedtime., Disp: , Rfl:    predniSONE (DELTASONE) 50 MG tablet, Take 1 tablet by mouth every 6 hours for three total doses. Take them 13 hours, 7 hours and 1 hour prior to the study., Disp: 3 tablet, Rfl: 0   SYNTHROID 112 MCG tablet, Take 1 tablet (112 mcg total) by mouth daily on an empty stomach with water at least 30 minutes before a meal.  NO ANTACID MEDS, CALCIUM OR MAGNESIUM FOR 4 HOURS. AVOID BIOTIN., Disp: 90 tablet, Rfl: 1   TURMERIC PO, Take by mouth daily., Disp: , Rfl:    vitamin B-12 (CYANOCOBALAMIN) 100 MCG tablet, Vitamin B12, Disp: ,  Rfl:    Medications ordered in this encounter:  No orders of the defined types were placed in this encounter.    *If you need refills on other medications prior to your next appointment, please contact your pharmacy*  Follow-Up: Call back or seek an in-person evaluation if the symptoms worsen or if the condition fails to improve as anticipated.  Other Instructions Please stop the Augmentin.  Start a daily probiotic.  Start the Doxycycline as directed. Continue other recommendations given by PA Burnette at time of yesterday's visit.    If you have been instructed to have an in-person evaluation today at a local Urgent Care facility, please use the link below. It will take you to a list of all of our available [facility], including address, phone number and hours of operation. Please do not delay care.  [facility]  If you or a family member do not have a primary care provider, use the link below to schedule a visit and establish care. When you choose a [facility] primary care physician or advanced practice provider, you gain a long-term partner in health. Find a Primary Care Provider  Learn more about [facility]'s in-office and virtual care options: [facility]

## 2022-03-04 ENCOUNTER — Other Ambulatory Visit (HOSPITAL_COMMUNITY): Payer: Self-pay

## 2022-03-04 ENCOUNTER — Encounter: Payer: Self-pay | Admitting: Physician Assistant

## 2022-03-04 DIAGNOSIS — R110 Nausea: Secondary | ICD-10-CM

## 2022-03-04 MED ORDER — ONDANSETRON HCL 4 MG PO TABS
4.0000 mg | ORAL_TABLET | Freq: Three times a day (TID) | ORAL | 0 refills | Status: DC | PRN
  Filled 2022-03-04: qty 20, 7d supply, fill #0

## 2022-03-04 NOTE — Addendum Note (Signed)
Addended by: Mar Daring on: 03/04/2022 08:40 AM   Modules accepted: Orders

## 2022-03-30 ENCOUNTER — Encounter (INDEPENDENT_AMBULATORY_CARE_PROVIDER_SITE_OTHER): Payer: 59 | Admitting: Ophthalmology

## 2022-04-06 DIAGNOSIS — Z4420 Encounter for fitting and adjustment of artificial eye, unspecified: Secondary | ICD-10-CM | POA: Diagnosis not present

## 2022-05-05 ENCOUNTER — Other Ambulatory Visit (HOSPITAL_COMMUNITY): Payer: Self-pay

## 2022-05-16 DIAGNOSIS — R911 Solitary pulmonary nodule: Secondary | ICD-10-CM | POA: Diagnosis not present

## 2022-05-16 DIAGNOSIS — R918 Other nonspecific abnormal finding of lung field: Secondary | ICD-10-CM | POA: Diagnosis not present

## 2022-05-16 DIAGNOSIS — C6932 Malignant neoplasm of left choroid: Secondary | ICD-10-CM | POA: Diagnosis not present

## 2022-05-17 DIAGNOSIS — C6932 Malignant neoplasm of left choroid: Secondary | ICD-10-CM | POA: Diagnosis not present

## 2022-05-25 ENCOUNTER — Other Ambulatory Visit (HOSPITAL_COMMUNITY): Payer: Self-pay

## 2022-06-20 NOTE — Progress Notes (Deleted)
Complete Physical  Assessment and Plan: Tracy Kidd was seen today for annual exam.  Diagnoses and all orders for this visit:  Encounter for general adult medical examination with abnormal findings Due Annually  Aortic atherosclerosis ([place]) Control blood pressure, cholesterol, glucose, increase exercise.  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 Continue to follow at [facility] and [facility] to monitor liver  Hyperlipidemia, unspecified hyperlipidemia type -     COMPLETE METABOLIC PANEL WITH GFR -     Lipid panel - Continue diet and exercise  Medication management -     CBC with Differential/Platelet -     COMPLETE METABOLIC PANEL WITH GFR -     Magnesium -     Lipid panel -     TSH -     Hemoglobin A1c -     VITAMIN D 25 Hydroxy (Vit-D Deficiency, Fractures) -     Urinalysis, Routine w reflex microscopic -     Microalbumin / creatinine urine ratio -     EKG 12-Lead  Vitamin D deficiency -     VITAMIN D 25 Hydroxy (Vit-D Deficiency, Fractures)  Anemia, unspecified type -     CBC with Differential/Platelet  Anxiety Continue medication and behavior modifications  Screening for hematuria or proteinuria -     Urinalysis, Routine w reflex microscopic -     Microalbumin / creatinine urine ratio  Screening for diabetes mellitus -     Hemoglobin A1c  Screen for colon cancer  Considering cologuard  Screening for ischemic heart disease -     EKG 12-Lead     Discussed med's effects and SE's. Screening labs and tests as requested with regular follow-up as recommended.  HPI 51 y.o. female  presents for a complete physical. Her blood pressure has been controlled at home, today their BP is     She was diagnosed with ocular melanoma, left eye in 2014 and has been seeing a specialist at [facility], Dr. Rito Ehrlich but now following with Dr.  Daralene Milch, she had radiation and subsequent glaucoma, following with Dr. Celene Squibb.  Had cataract surgery in June 2019.   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 Now having CT AB with contrast q 3 months and following closely with [facility].  She had recent grafting of liposection of AB tissue for her left eye. She gets METS surveillance yearly and is due for CT AB with contrast. Has one next week.   Had Pap with Dr. Marvel Plan.  She is also seeing Dr. [place] every 6 months for skin checks.  She has trouble sleeping and anxiety, was placed on prozac and states it seems to be helping.   BMI is There is no height or weight on file to calculate BMI., she is working on diet and exercise. She is doing weight watchers and is down 20 pounds. -Works lab at [employer].  Wt Readings from Last 3 Encounters:  06/21/21 172 lb (78 kg)  06/18/20 176 lb 3.2 oz (79.9 kg)  06/19/19 168 lb 9.6 oz (76.5 kg)   She does not workout. She denies chest pain, shortness of breath, dizziness.  She is not on cholesterol medication and denies myalgias. Her cholesterol is not at goal. The cholesterol last visit was:  Lab Results  Component Value Date  CHOL 184 06/21/2021   HDL 43 (L) 06/21/2021   LDLCALC 120 (H) 06/21/2021   TRIG 100 06/21/2021   CHOLHDL 4.3 06/21/2021    Last A1C in the office was:  Lab Results  Component Value Date   HGBA1C 5.3 06/21/2021   Patient is on Vitamin D supplement, 10,000 daily, will miss some.  Lab Results  Component Value Date   VD25OH 31 06/21/2021   She has a B12 but does not take it regularly, takes sublingual takes 2 a day.  Lab Results  Component Value Date   YYFRTMYT11 7,356 (H) 06/19/2019   She is on thyroid medication. Her medication was not changed last visit.  She is not on biotin.  Lab Results  Component Value Date   TSH 2.03 06/21/2021  .  She is not on iron regularlly Lab Results   Component Value Date   IRON 53 06/18/2020   TIBC 389 06/18/2020   FERRITIN 11 (L) 06/18/2020     Current Medications:   Current Outpatient Medications (Endocrine & Metabolic):    predniSONE (DELTASONE) 50 MG tablet, Take 1 tablet by mouth every 6 hours for three total doses. Take them 13 hours, 7 hours and 1 hour prior to the study.   SYNTHROID 112 MCG tablet, Take 1 tablet (112 mcg total) by mouth daily on an empty stomach with water at least 30 minutes before a meal.  NO ANTACID MEDS, CALCIUM OR MAGNESIUM FOR 4 HOURS. AVOID BIOTIN.   Current Outpatient Medications (Respiratory):    Cetirizine HCl 10 MG CAPS, Zyrtec (Patient not taking: Reported on 03/30/2021)   fluticasone (FLONASE) 50 MCG/ACT nasal spray, Place 2 sprays into both nostrils daily.   levocetirizine (XYZAL) 5 MG tablet, Take 5 mg by mouth every evening.   Current Outpatient Medications (Hematological):    Cyanocobalamin (VITAMIN B-12 PO), Take 1 tablet by mouth daily.   Ferrous Sulfate (IRON) 28 MG TABS, Take by mouth daily.   vitamin B-12 (CYANOCOBALAMIN) 100 MCG tablet, Vitamin B12  Current Outpatient Medications (Other):    carboxymethylcellulose (REFRESH PLUS) 0.5 % SOLN, Apply to eye.   cholecalciferol (VITAMIN D) 1000 UNITS tablet, Take 2,000 Units by mouth daily.   doxycycline (VIBRA-TABS) 100 MG tablet, Take 1 tablet (100 mg total) by mouth 2 (two) times daily.   FLUoxetine (PROZAC) 20 MG capsule, Take 1 capsule (20 mg total) by mouth daily for mood   MELATONIN PO, Take 1 tablet by mouth at bedtime.   ondansetron (ZOFRAN) 4 MG tablet, Take 1 tablet (4 mg total) by mouth every 8 (eight) hours as needed for nausea or vomiting.   TURMERIC PO, Take by mouth daily.  Health Maintenance:   Immunization History  Administered Date(s) Administered   Influenza,inj,Quad PF,6+ Mos 05/28/2015   PFIZER(Purple Top)SARS-COV-2 Vaccination 01/17/2020, 02/14/2020   Tdap 08/12/2014   TDAP: 2015  Pneumovax: N/A Prevnar  13: N/A Flu vaccine: 2014 Zostavax: N/A COVID vaccines complete Pap: 2016 q 5 years due this year, to call Dr. Marvel Plan to schedule MGM: 2020, due this year, gets at Dr. Marvel Plan calling this week Colonoscopy: due age 75, considering cologuard EGD:  Medical History:  Past Medical History:  Diagnosis Date   Arthritis    hands and feet   Cancer ([place])    ocular melanoma   Hypothyroidism    PONV (postoperative nausea and vomiting)    past surgery Scop patch and propofol helped   Thyroid disease    Allergies Allergies  Allergen Reactions   Povidone-Iodine  Swelling    Only in EYE, tolerates on skin   Latanoprost Other (See Comments)    Burns and stings, severe tearing   Oxycodone Nausea And Vomiting    Can take morphine and possibly Vicodin Can take morphine and possibly Vicodin   SURGICAL HISTORY She  has a past surgical history that includes Wisdom tooth extraction; Eye surgery; and laparoscopy (N/A, 09/13/2013).   FAMILY HISTORY Her family history includes Arthritis in her mother; Autoimmune disease in her father; Cancer in her maternal grandmother and mother; Cancer (age of onset: 39) in her maternal aunt; Heart disease in her maternal grandfather; Hyperlipidemia in her father; Nephrolithiasis in her father; Varicose Veins in her mother.   SOCIAL HISTORY She  reports that she has never smoked. She has never used smokeless tobacco. She reports current alcohol use. She reports that she does not use drugs.  Review of Systems  Constitutional: Negative.  Negative for chills and fever.  HENT:  Negative for congestion (allergies, on allegra), ear discharge, ear pain, hearing loss, nosebleeds, sinus pain, sore throat and tinnitus.   Eyes:  Negative for blurred vision, double vision, photophobia, pain, discharge and redness.  Respiratory: Negative.  Negative for cough, hemoptysis, sputum production, shortness of breath, wheezing and stridor.   Cardiovascular:  Positive for  palpitations (rare). Negative for chest pain and leg swelling.  Gastrointestinal: Negative.  Negative for abdominal pain, constipation, diarrhea, heartburn, nausea and vomiting.  Genitourinary: Negative.  Negative for dysuria and urgency.  Musculoskeletal:  Negative for back pain, falls, joint pain, myalgias and neck pain.  Skin: Negative.  Negative for itching and rash.  Neurological: Negative.  Negative for dizziness, tingling, tremors, weakness and headaches.  Endo/Heme/Allergies: Negative.  Does not bruise/bleed easily.  Psychiatric/Behavioral: Negative.  Negative for depression and suicidal ideas. The patient is not nervous/anxious and does not have insomnia.     Physical Exam: Estimated body mass index is 30.47 kg/m as calculated from the following:   Height as of 06/21/21: '5\' 3"'  (1.6 m).   Weight as of 06/21/21: 172 lb (78 kg). There were no vitals taken for this visit. General Appearance: Well nourished, in no apparent distress. Eyes: PERRLA, EOMs, left  no ptosis noted, conjunctiva no swelling or erythema Sinuses: No Frontal/maxillary tenderness ENT/Mouth: Ext aud canals clear, normal light reflex with TMs without erythema, bulging.  Good dentition. No erythema, swelling, or exudate on post pharynx. Tonsils not swollen or erythematous. Hearing normal.  Neck: Supple, thyroid normal. No bruits Respiratory: Respiratory effort normal, BS equal bilaterally without rales, rhonchi, wheezing or stridor. Cardio: RRR without murmurs, rubs or gallops. Brisk peripheral pulses without edema.  Chest: symmetric, with normal excursions and percussion. Breasts: defer Abdomen: Soft, +BS. Non tender, no guarding, rebound, hernias, masses, or organomegaly.  Lymphatics: Non tender without lymphadenopathy.  Genitourinary: defer Musculoskeletal: Full ROM all peripheral extremities,5/5 strength, Skin:  Warm, dry without rashes, lesions, ecchymosis.  Neuro: Cranial nerves intact, reflexes equal  bilaterally. Normal muscle tone, no cerebellar symptoms. Sensation intact.  Psych: Awake and oriented X 3, normal affect, Insight and Judgment appropriate.   EKG: No ST changes, normal sinus rhythm   Tracy Kidd  12:29 PM

## 2022-06-21 ENCOUNTER — Encounter: Payer: 59 | Admitting: Nurse Practitioner

## 2022-06-21 DIAGNOSIS — Z1389 Encounter for screening for other disorder: Secondary | ICD-10-CM

## 2022-06-21 DIAGNOSIS — E559 Vitamin D deficiency, unspecified: Secondary | ICD-10-CM

## 2022-06-21 DIAGNOSIS — Z136 Encounter for screening for cardiovascular disorders: Secondary | ICD-10-CM

## 2022-06-21 DIAGNOSIS — D649 Anemia, unspecified: Secondary | ICD-10-CM

## 2022-06-21 DIAGNOSIS — E785 Hyperlipidemia, unspecified: Secondary | ICD-10-CM

## 2022-06-21 DIAGNOSIS — C6932 Malignant neoplasm of left choroid: Secondary | ICD-10-CM

## 2022-06-21 DIAGNOSIS — E890 Postprocedural hypothyroidism: Secondary | ICD-10-CM

## 2022-06-21 DIAGNOSIS — F419 Anxiety disorder, unspecified: Secondary | ICD-10-CM

## 2022-06-21 DIAGNOSIS — Z131 Encounter for screening for diabetes mellitus: Secondary | ICD-10-CM

## 2022-06-21 DIAGNOSIS — I700 Atherosclerosis of aorta: Secondary | ICD-10-CM

## 2022-06-21 DIAGNOSIS — Z79899 Other long term (current) drug therapy: Secondary | ICD-10-CM

## 2022-06-21 DIAGNOSIS — Z0001 Encounter for general adult medical examination with abnormal findings: Secondary | ICD-10-CM

## 2022-07-18 NOTE — Progress Notes (Unsigned)
Complete Physical  Assessment and Plan: Mylea was seen today for annual exam.  Diagnoses and all orders for this visit:  Encounter for general adult medical examination with abnormal findings Due Annually  Aortic atherosclerosis ([place]) Control blood pressure, cholesterol, glucose, increase exercise.  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 Continue to follow at [place] and [facility] to monitor liver Is looking for new dermatologist for skin checks  Hyperlipidemia, unspecified hyperlipidemia type -     COMPLETE METABOLIC PANEL WITH GFR -     Lipid panel - Continue diet and exercise  Medication management -     CBC with Differential/Platelet -     COMPLETE METABOLIC PANEL WITH GFR -     Magnesium -     Lipid panel -     TSH -     Hemoglobin A1c -     VITAMIN D 25 Hydroxy (Vit-D Deficiency, Fractures) -     Urinalysis, Routine w reflex microscopic -     Microalbumin / creatinine urine ratio   Vitamin D deficiency Continue Vit D supplementation to maintain value in therapeutic level of 60-100  -     VITAMIN D 25 Hydroxy (Vit-D Deficiency, Fractures)  Anemia, unspecified type -     CBC with Differential/Platelet  Anxiety Feels stress is well controlled currently and would like to wean off medication Will take Prozac 10 mg daily x 7 days then every other day x 7 days If symptoms return notify the office  Screening for hematuria or proteinuria -     Urinalysis, Routine w reflex microscopic -     Microalbumin / creatinine urine ratio  Screening for diabetes mellitus -     Hemoglobin A1c   Obesity BMI 31 Long discussion about weight loss, diet, and exercise Recommended diet heavy in fruits and veggies and low in animal meats, cheeses, and dairy products, appropriate calorie intake Patient will work on decreasing simple carbs and  saturated fats, increase exercise Follow up at next visit  Chronic knee pain - Tylenol and heat as needed Monitor and if persists or worsens will order xray  Discussed med's effects and SE's. Screening labs and tests as requested with regular follow-up as recommended.  HPI 51 y.o. female  presents for a complete physical. has Malignant neoplasm of choroid ([place]); Vitamin D deficiency; Hyperlipidemia; Medication management; Anemia; B12 deficiency; Psoriasis; Corticosteroid-induced glaucoma; Arthropathia; Claustrophobia; Hypothyroidism following radioiodine therapy; Lattice degeneration; Error, refractive, myopia; Aortic atherosclerosis ([place]); and H/O enucleation of left eyeball on their problem list.   Her blood pressure has been controlled at home, today their BP is BP: 138/84  BP Readings from Last 3 Encounters:  07/20/22 138/84  06/21/21 132/82  06/18/20 122/80  She denies headaches, chest pain, shortness of breath and dizziness.    She was diagnosed with ocular melanoma, left eye in 2014 and has been seeing a specialist at [facility], Dr. Rito Ehrlich but now following with Dr. Daralene Milch, she had radiation and subsequent glaucoma, following with Dr. Celene Squibb.  Had cataract surgery in June 2019.   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 Now having CT AB with contrast q 3 months and following closely with [facility].  She had recent grafting of liposection of AB tissue for her left eye. She gets METS  surveillance yearly and is due for CT of chest and MRI of liver q 3 months- everything has been normal at this point   Had Pap with Dr. Marvel Plan. 10/2021. She continues to have heavy periods with clots and cramping.  Has fibroids and is followed by Dr. Marvel Plan.   She had been seeing Dr. Martinique every 6 months for skin checks but is off schedule.  She would like a new provider   She has been noticing some pain in both  knees intermittently.  When pressure applied to medial lower knee she does have pain.  Wore brace at one point and it did help.   She has been taking prozac for anxiety but is interested in weaning off the medication. Stress is now much less.   BMI is Body mass index is 31.32 kg/m., she is working on diet and exercise.   Wt Readings from Last 3 Encounters:  07/20/22 176 lb 12.8 oz (80.2 kg)  06/21/21 172 lb (78 kg)  06/18/20 176 lb 3.2 oz (79.9 kg)   She does not workout. She denies chest pain, shortness of breath, dizziness.  She is not on cholesterol medication and denies myalgias. Her cholesterol is not at goal. The cholesterol last visit was:  Lab Results  Component Value Date   CHOL 184 06/21/2021   HDL 43 (L) 06/21/2021   LDLCALC 120 (H) 06/21/2021   TRIG 100 06/21/2021   CHOLHDL 4.3 06/21/2021    Last A1C in the office was:  Lab Results  Component Value Date   HGBA1C 5.3 06/21/2021   Patient is on Vitamin D supplement, 10,000 daily, will miss some.  Lab Results  Component Value Date   VD25OH 31 06/21/2021   She has a B12 but does not take it regularly, takes sublingual takes 2 a day.  Lab Results  Component Value Date   ZOXWRUEA54 0,981 (H) 06/19/2019   She is on thyroid medication, levothyroxine 112 mcg daily. Her medication was not changed last visit.  She is not on biotin.  Lab Results  Component Value Date   TSH 2.03 06/21/2021  .  She is not on iron regularlly Lab Results  Component Value Date   IRON 53 06/18/2020   TIBC 389 06/18/2020   FERRITIN 11 (L) 06/18/2020     Current Medications:   Current Outpatient Medications (Endocrine & Metabolic):    SYNTHROID 191 MCG tablet, Take 1 tablet (112 mcg total) by mouth daily on an empty stomach with water at least 30 minutes before a meal.  NO ANTACID MEDS, CALCIUM OR MAGNESIUM FOR 4 HOURS. AVOID BIOTIN.   predniSONE (DELTASONE) 50 MG tablet, Take 1 tablet by mouth every 6 hours for three total doses. Take  them 13 hours, 7 hours and 1 hour prior to the study. (Patient not taking: Reported on 07/20/2022)   Current Outpatient Medications (Respiratory):    Cetirizine HCl 10 MG CAPS,    fluticasone (FLONASE) 50 MCG/ACT nasal spray, Place 2 sprays into both nostrils daily. (Patient not taking: Reported on 07/20/2022)   levocetirizine (XYZAL) 5 MG tablet, Take 5 mg by mouth every evening. (Patient not taking: Reported on 07/20/2022)   Current Outpatient Medications (Hematological):    Cyanocobalamin (VITAMIN B-12 PO), Take 1 tablet by mouth daily.   vitamin B-12 (CYANOCOBALAMIN) 100 MCG tablet, Vitamin B12   Ferrous Sulfate (IRON) 28 MG TABS, Take by mouth daily. (Patient not taking: Reported on 07/20/2022)  Current Outpatient Medications (Other):    carboxymethylcellulose (REFRESH PLUS)  0.5 % SOLN, Apply to eye.   cholecalciferol (VITAMIN D) 1000 UNITS tablet, Take 2,000 Units by mouth daily.   FLUoxetine (PROZAC) 20 MG capsule, Take 1 capsule (20 mg total) by mouth daily for mood   MELATONIN PO, Take 1 tablet by mouth at bedtime.   Polyethyl Glycol-Propyl Glycol (SYSTANE) 0.4-0.3 % SOLN, Apply to eye.   TURMERIC PO, Take by mouth daily.   doxycycline (VIBRA-TABS) 100 MG tablet, Take 1 tablet (100 mg total) by mouth 2 (two) times daily. (Patient not taking: Reported on 07/20/2022)   ondansetron (ZOFRAN) 4 MG tablet, Take 1 tablet (4 mg total) by mouth every 8 (eight) hours as needed for nausea or vomiting. (Patient not taking: Reported on 07/20/2022)  Health Maintenance:   Immunization History  Administered Date(s) Administered   Influenza,inj,Quad PF,6+ Mos 05/28/2015   Influenza-Unspecified 07/04/2022   PFIZER(Purple Top)SARS-COV-2 Vaccination 01/17/2020, 02/14/2020   Tdap 08/12/2014   TDAP: 2015  Pneumovax: N/A Prevnar 13: N/A Flu vaccine: 2014 Zostavax: N/A COVID vaccines complete Pap: 11/22/21 MGM: 12/14/21 Colonoscopy: due age 84, considering cologuard EGD:  Medical History:   Past Medical History:  Diagnosis Date   Arthritis    hands and feet   Cancer ([place])    ocular melanoma   Hypothyroidism    PONV (postoperative nausea and vomiting)    past surgery Scop patch and propofol helped   Thyroid disease    Allergies Allergies  Allergen Reactions   Povidone-Iodine Swelling    Only in EYE, tolerates on skin   Latanoprost Other (See Comments)    Burns and stings, severe tearing   Oxycodone Nausea And Vomiting    Can take morphine and possibly Vicodin Can take morphine and possibly Vicodin   SURGICAL HISTORY She  has a past surgical history that includes Wisdom tooth extraction; Eye surgery; and laparoscopy (N/A, 09/13/2013).   FAMILY HISTORY Her family history includes Arthritis in her mother; Autoimmune disease in her father; Cancer in her maternal grandmother and mother; Cancer (age of onset: 21) in her maternal aunt; Heart disease in her maternal grandfather; Hyperlipidemia in her father; Nephrolithiasis in her father; Varicose Veins in her mother.   SOCIAL HISTORY She  reports that she has never smoked. She has never used smokeless tobacco. She reports current alcohol use. She reports that she does not use drugs.  Review of Systems  Constitutional: Negative.  Negative for chills and fever.  HENT:  Negative for congestion (allergies, on allegra), ear discharge, ear pain, hearing loss, nosebleeds, sinus pain, sore throat and tinnitus.   Eyes:  Negative for blurred vision, double vision, photophobia, pain, discharge and redness.  Respiratory: Negative.  Negative for cough, hemoptysis, sputum production, shortness of breath, wheezing and stridor.   Cardiovascular:  Negative for chest pain, palpitations and leg swelling.  Gastrointestinal: Negative.  Negative for abdominal pain, constipation, diarrhea, heartburn, nausea and vomiting.  Genitourinary: Negative.  Negative for dysuria and urgency.  Musculoskeletal:  Positive for joint pain (knees  bilaterally). Negative for back pain, falls, myalgias and neck pain.  Skin: Negative.  Negative for itching and rash.  Neurological: Negative.  Negative for dizziness, tingling, tremors, weakness and headaches.  Endo/Heme/Allergies: Negative.  Does not bruise/bleed easily.  Psychiatric/Behavioral: Negative.  Negative for depression and suicidal ideas. The patient is not nervous/anxious and does not have insomnia.     Physical Exam: Estimated body mass index is 31.32 kg/m as calculated from the following:   Height as of this encounter: '5\' 3"'  (1.6 m).  Weight as of this encounter: 176 lb 12.8 oz (80.2 kg). BP 138/84   Pulse 69   Temp (!) 97.5 F (36.4 C)   Ht '5\' 3"'  (1.6 m)   Wt 176 lb 12.8 oz (80.2 kg)   LMP 07/01/2022   SpO2 98%   BMI 31.32 kg/m  General Appearance: Well nourished, in no apparent distress. Eyes: PERRLA, EOMs, left  ptosis noted-has prosthetic, conjunctiva no swelling or erythema Sinuses: No Frontal/maxillary tenderness ENT/Mouth: Ext aud canals clear, normal light reflex with TMs without erythema, bulging.  Good dentition. No erythema, swelling, or exudate on post pharynx. Tonsils not swollen or erythematous. Hearing normal.  Neck: Supple, thyroid normal. No bruits Respiratory: Respiratory effort normal, BS equal bilaterally without rales, rhonchi, wheezing or stridor. Cardio: RRR without murmurs, rubs or gallops. Brisk peripheral pulses without edema.  Chest: symmetric, with normal excursions and percussion. Breasts: defer Abdomen: Soft, +BS. Non tender, no guarding, rebound, hernias, masses, or organomegaly.  Lymphatics: Non tender without lymphadenopathy.  Genitourinary: defer Musculoskeletal: Full ROM all peripheral extremities,5/5 strength. Mild tenderness with pressure of medial collateral ligament R > L Skin:  Warm, dry without rashes, lesions, ecchymosis.  Neuro: Cranial nerves intact, reflexes equal bilaterally. Normal muscle tone, no cerebellar symptoms.  Sensation intact.  Psych: Awake and oriented X 3, normal affect, Insight and Judgment appropriate.   EKG: deferred, had baseline last year    Tracy Kidd  3:24 PM

## 2022-07-20 ENCOUNTER — Ambulatory Visit (INDEPENDENT_AMBULATORY_CARE_PROVIDER_SITE_OTHER): Payer: 59 | Admitting: Nurse Practitioner

## 2022-07-20 ENCOUNTER — Encounter: Payer: Self-pay | Admitting: Nurse Practitioner

## 2022-07-20 ENCOUNTER — Other Ambulatory Visit (HOSPITAL_COMMUNITY): Payer: Self-pay

## 2022-07-20 VITALS — BP 138/84 | HR 69 | Temp 97.5°F | Ht 63.0 in | Wt 176.8 lb

## 2022-07-20 DIAGNOSIS — E785 Hyperlipidemia, unspecified: Secondary | ICD-10-CM

## 2022-07-20 DIAGNOSIS — Z1389 Encounter for screening for other disorder: Secondary | ICD-10-CM | POA: Diagnosis not present

## 2022-07-20 DIAGNOSIS — Z79899 Other long term (current) drug therapy: Secondary | ICD-10-CM | POA: Diagnosis not present

## 2022-07-20 DIAGNOSIS — C6932 Malignant neoplasm of left choroid: Secondary | ICD-10-CM

## 2022-07-20 DIAGNOSIS — E559 Vitamin D deficiency, unspecified: Secondary | ICD-10-CM | POA: Diagnosis not present

## 2022-07-20 DIAGNOSIS — Z136 Encounter for screening for cardiovascular disorders: Secondary | ICD-10-CM

## 2022-07-20 DIAGNOSIS — F419 Anxiety disorder, unspecified: Secondary | ICD-10-CM

## 2022-07-20 DIAGNOSIS — E890 Postprocedural hypothyroidism: Secondary | ICD-10-CM | POA: Diagnosis not present

## 2022-07-20 DIAGNOSIS — I700 Atherosclerosis of aorta: Secondary | ICD-10-CM

## 2022-07-20 DIAGNOSIS — Z131 Encounter for screening for diabetes mellitus: Secondary | ICD-10-CM | POA: Diagnosis not present

## 2022-07-20 DIAGNOSIS — Z0000 Encounter for general adult medical examination without abnormal findings: Secondary | ICD-10-CM | POA: Diagnosis not present

## 2022-07-20 DIAGNOSIS — G8929 Other chronic pain: Secondary | ICD-10-CM

## 2022-07-20 DIAGNOSIS — Z0001 Encounter for general adult medical examination with abnormal findings: Secondary | ICD-10-CM

## 2022-07-20 DIAGNOSIS — D649 Anemia, unspecified: Secondary | ICD-10-CM

## 2022-07-20 DIAGNOSIS — E6609 Other obesity due to excess calories: Secondary | ICD-10-CM

## 2022-07-20 MED ORDER — FLUOXETINE HCL 10 MG PO CAPS
10.0000 mg | ORAL_CAPSULE | Freq: Every day | ORAL | 0 refills | Status: DC
  Filled 2022-07-20: qty 21, 21d supply, fill #0

## 2022-07-20 NOTE — Patient Instructions (Signed)
Prozac 10 mg called to the pharmacy.  Take 1 pill a day every day for 7 days then every other day for the next 7 days.

## 2022-07-21 ENCOUNTER — Other Ambulatory Visit: Payer: Self-pay | Admitting: Nurse Practitioner

## 2022-07-21 DIAGNOSIS — E890 Postprocedural hypothyroidism: Secondary | ICD-10-CM

## 2022-07-30 LAB — MAGNESIUM: Magnesium: 2 mg/dL (ref 1.5–2.5)

## 2022-07-30 LAB — HEMOGLOBIN A1C
Hgb A1c MFr Bld: 5.5 % of total Hgb (ref <5.7)
Mean Plasma Glucose: 111 mg/dL
eAG (mmol/L): 6.2 mmol/L

## 2022-07-30 LAB — URINALYSIS, ROUTINE W REFLEX MICROSCOPIC
Bilirubin Urine: NEGATIVE
Glucose, UA: NEGATIVE
Hgb urine dipstick: NEGATIVE
Ketones, ur: NEGATIVE
Leukocytes,Ua: NEGATIVE
Nitrite: NEGATIVE
Protein, ur: NEGATIVE
Specific Gravity, Urine: 1.024 (ref 1.001–1.035)
pH: 7 (ref 5.0–8.0)

## 2022-07-30 LAB — CBC WITH DIFFERENTIAL/PLATELET
Absolute Monocytes: 271 cells/uL (ref 200–950)
Basophils Absolute: 41 cells/uL (ref 0–200)
Basophils Relative: 0.7 %
Eosinophils Absolute: 47 cells/uL (ref 15–500)
Eosinophils Relative: 0.8 %
HCT: 35.1 % (ref 35.0–45.0)
Hemoglobin: 11.6 g/dL — ABNORMAL LOW (ref 11.7–15.5)
Lymphs Abs: 1982 cells/uL (ref 850–3900)
MCH: 27.8 pg (ref 27.0–33.0)
MCHC: 33 g/dL (ref 32.0–36.0)
MCV: 84.2 fL (ref 80.0–100.0)
MPV: 10.2 fL (ref 7.5–12.5)
Monocytes Relative: 4.6 %
Neutro Abs: 3558 cells/uL (ref 1500–7800)
Neutrophils Relative %: 60.3 %
Platelets: 255 10*3/uL (ref 140–400)
RBC: 4.17 10*6/uL (ref 3.80–5.10)
RDW: 14.4 % (ref 11.0–15.0)
Total Lymphocyte: 33.6 %
WBC: 5.9 10*3/uL (ref 3.8–10.8)

## 2022-07-30 LAB — COMPLETE METABOLIC PANEL WITH GFR
AG Ratio: 1.6 (calc) (ref 1.0–2.5)
ALT: 11 U/L (ref 6–29)
AST: 13 U/L (ref 10–35)
Albumin: 4.2 g/dL (ref 3.6–5.1)
Alkaline phosphatase (APISO): 92 U/L (ref 31–125)
BUN: 12 mg/dL (ref 7–25)
CO2: 26 mmol/L (ref 20–32)
Calcium: 9.4 mg/dL (ref 8.6–10.2)
Chloride: 105 mmol/L (ref 98–110)
Creat: 0.76 mg/dL (ref 0.50–0.99)
Globulin: 2.6 g/dL (calc) (ref 1.9–3.7)
Glucose, Bld: 79 mg/dL (ref 65–99)
Potassium: 4.1 mmol/L (ref 3.5–5.3)
Sodium: 139 mmol/L (ref 135–146)
Total Bilirubin: 0.3 mg/dL (ref 0.2–1.2)
Total Protein: 6.8 g/dL (ref 6.1–8.1)
eGFR: 96 mL/min/{1.73_m2} (ref >=60)

## 2022-07-30 LAB — ALKALINE PHOSPHATASE ISOENZYMES
Alkaline phosphatase (APISO): 90 U/L (ref 31–125)
Bone Isoenzymes: 22 % — ABNORMAL LOW (ref 28–66)
Intestinal Isoenzymes: 8 % (ref 1–24)
Liver Isoenzymes: 70 % — ABNORMAL HIGH (ref 25–69)
Macrohepatic isoenzymes: 0 % (ref <=0)
Placental isoenzymes: 0 % (ref <=0)

## 2022-07-30 LAB — LIPID PANEL
Cholesterol: 219 mg/dL — ABNORMAL HIGH (ref <200)
HDL: 47 mg/dL — ABNORMAL LOW (ref >=50)
LDL Cholesterol (Calc): 141 mg/dL (calc) — ABNORMAL HIGH
Non-HDL Cholesterol (Calc): 172 mg/dL (calc) — ABNORMAL HIGH (ref <130)
Total CHOL/HDL Ratio: 4.7 (calc) (ref <5.0)
Triglycerides: 179 mg/dL — ABNORMAL HIGH (ref <150)

## 2022-07-30 LAB — VITAMIN D 25 HYDROXY (VIT D DEFICIENCY, FRACTURES): Vit D, 25-Hydroxy: 25 ng/mL — ABNORMAL LOW (ref 30–100)

## 2022-07-30 LAB — MICROALBUMIN / CREATININE URINE RATIO
Creatinine, Urine: 98 mg/dL (ref 20–275)
Microalb Creat Ratio: 2 mcg/mg creat (ref <30)
Microalb, Ur: 0.2 mg/dL

## 2022-07-30 LAB — GAMMA GT: GGT: 10 U/L (ref 3–55)

## 2022-07-30 LAB — TSH: TSH: 6.43 mIU/L — ABNORMAL HIGH

## 2022-07-31 ENCOUNTER — Encounter: Payer: Self-pay | Admitting: Internal Medicine

## 2022-08-22 DIAGNOSIS — R918 Other nonspecific abnormal finding of lung field: Secondary | ICD-10-CM | POA: Diagnosis not present

## 2022-08-22 DIAGNOSIS — I2510 Atherosclerotic heart disease of native coronary artery without angina pectoris: Secondary | ICD-10-CM | POA: Diagnosis not present

## 2022-08-22 DIAGNOSIS — C6932 Malignant neoplasm of left choroid: Secondary | ICD-10-CM | POA: Diagnosis not present

## 2022-08-23 DIAGNOSIS — C6932 Malignant neoplasm of left choroid: Secondary | ICD-10-CM | POA: Diagnosis not present

## 2022-08-25 ENCOUNTER — Other Ambulatory Visit: Payer: 59

## 2022-08-26 NOTE — Progress Notes (Shared)
[facility] Note  08/29/2022     CHIEF COMPLAINT Patient presents for No chief complaint on file.    HISTORY OF PRESENT ILLNESS: Tracy Kidd is a 51 y.o. female who presents to the clinic today for:    Patient had recurrence of ocular melanoma with vitreous hemorrhage OS. Underwent vitrectomy and ultimately end up with enucleation OS. Patient has had eyelid sx as well on left side.   Referring physician: Unk Pinto, MD [address],  [place] [address]  HISTORICAL INFORMATION:   Selected notes from the MEDICAL RECORD NUMBER Referred by Dr. Albina Billet for cataract surery clearance OS;  LEE- 03.01.19 (C. [facility]) [BCVA OD: 20/20 OS: 20/50 MRx: OD: -7.00+1.00x030 OS: -5.50 sph] Ocular Hx- hx ocular melanoma OS (treated at [facility] with radiation in 2014), s/p laser retinopexy OD for lattice degen, cataract OS;  PMH-    CURRENT MEDICATIONS: Current Outpatient Medications (Ophthalmic Drugs)  Medication Sig   carboxymethylcellulose (REFRESH PLUS) 0.5 % SOLN Apply to eye.   Polyethyl Glycol-Propyl Glycol (SYSTANE) 0.4-0.3 % SOLN Apply to eye.   No current facility-administered medications for this visit. (Ophthalmic Drugs)   Current Outpatient Medications (Other)  Medication Sig   Cetirizine HCl 10 MG CAPS    cholecalciferol (VITAMIN D) 1000 UNITS tablet Take 2,000 Units by mouth daily.   Cyanocobalamin (VITAMIN B-12 PO) Take 1 tablet by mouth daily.   doxycycline (VIBRA-TABS) 100 MG tablet Take 1 tablet (100 mg total) by mouth 2 (two) times daily. (Patient not taking: Reported on 07/20/2022)   Ferrous Sulfate (IRON) 28 MG TABS Take by mouth daily. (Patient not taking: Reported on 07/20/2022)   FLUoxetine (PROZAC) 10 MG capsule Take 1 capsule (10 mg total) by mouth daily.   fluticasone (FLONASE) 50 MCG/ACT nasal spray Place 2 sprays into both nostrils daily. (Patient not taking: Reported on 07/20/2022)   levocetirizine  (XYZAL) 5 MG tablet Take 5 mg by mouth every evening. (Patient not taking: Reported on 07/20/2022)   MELATONIN PO Take 1 tablet by mouth at bedtime.   ondansetron (ZOFRAN) 4 MG tablet Take 1 tablet (4 mg total) by mouth every 8 (eight) hours as needed for nausea or vomiting. (Patient not taking: Reported on 07/20/2022)   predniSONE (DELTASONE) 50 MG tablet Take 1 tablet by mouth every 6 hours for three total doses. Take them 13 hours, 7 hours and 1 hour prior to the study. (Patient not taking: Reported on 07/20/2022)   SYNTHROID 112 MCG tablet Take 1 tablet (112 mcg total) by mouth daily on an empty stomach with water at least 30 minutes before a meal.  NO ANTACID MEDS, CALCIUM OR MAGNESIUM FOR 4 HOURS. AVOID BIOTIN.   TURMERIC PO Take by mouth daily.   vitamin B-12 (CYANOCOBALAMIN) 100 MCG tablet Vitamin B12   No current facility-administered medications for this visit. (Other)      REVIEW OF SYSTEMS:      ALLERGIES Allergies  Allergen Reactions   Povidone-Iodine Swelling    Only in EYE, tolerates on skin   Latanoprost Other (See Comments)    Burns and stings, severe tearing   Oxycodone Nausea And Vomiting    Can take morphine and possibly Vicodin Can take morphine and possibly Vicodin    PAST MEDICAL HISTORY Past Medical History:  Diagnosis Date   Arthritis    hands and feet   Cancer ([facility])    ocular melanoma   Hypothyroidism    PONV (postoperative  nausea and vomiting)    past surgery Scop patch and propofol helped   Thyroid disease    Past Surgical History:  Procedure Laterality Date   EYE SURGERY     radiation plaque placement and removal   LAPAROSCOPY N/A 09/13/2013   Procedure: LAPAROSCOPY OPERATIVE, removal of pelvic mass;  Surgeon: Logan Bores, MD;  Location: [facility];  Service: Gynecology;  Laterality: N/A;   WISDOM TOOTH EXTRACTION      FAMILY HISTORY Family History  Problem Relation Age of Onset   Varicose Veins Mother    Arthritis Mother     Cancer Mother        Melanoma   Nephrolithiasis Father    Hyperlipidemia Father    Autoimmune disease Father        atrophic gastritis/B12 def   Cancer Maternal Grandmother        Lung   Heart disease Maternal Grandfather        CHF   Cancer Maternal Aunt 70       melanoma    SOCIAL HISTORY Social History   Tobacco Use   Smoking status: Never   Smokeless tobacco: Never  Vaping Use   Vaping Use: Never used  Substance Use Topics   Alcohol use: Yes    Comment: rare   Drug use: No         OPHTHALMIC EXAM:  Not recorded     IMAGING AND PROCEDURES  Imaging and Procedures for 12/05/17             ASSESSMENT/PLAN:    ICD-10-CM   1. Malignant melanoma of choroid of left eye ([place])  C69.32     2. H/O enucleation of left eyeball  Z90.01     3. 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 Lattice degeneration of peripheral retina, right  H35.411     5. Nuclear sclerosis, right  H25.11        1-3. 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 Boyd Kerbs - developed recurrence of choroidal melanoma with vitreous hemorrhage in December 2020 - underwent vitrectomy and then enucleation OS in Jan of 2021 - prosthetic in place -- looks great - recommend polycarbonate glasses - monitor  4. Lattice degeneration, OD - patches at 0730 and 1030-1130 - s/p laser retinopexy with Dr. Zigmund Daniel - good laser in place - no new lattice, RT/RD  - monitor  5. Nuclear sclerosis, OD - The symptoms of cataract, surgical options, and treatments and risks were discussed with patient. - discussed diagnosis and progression - not yet visually significant - monitor for now    Ophthalmic Meds Ordered this visit:  No orders of the defined types were placed in this encounter.       No follow-ups on file.  There are no Patient  Instructions on file for this visit.   Explained the diagnoses, plan, and follow up with the patient and they expressed understanding.  Patient expressed understanding of the importance of proper follow up care.   This document serves as a record of services personally performed by Gardiner Sleeper, MD, PhD. It was created on their behalf by San Jetty. Owens Shark, OA an ophthalmic technician. The creation of this record is the provider's dictation and/or activities during the visit.    Electronically signed by: San Jetty. Owens Shark, OA  12.01.2023 11:11 AM   Gardiner Sleeper, M.D., Ph.D. Diseases & Surgery of the Retina and Vitreous [facility]: M myopia (nearsighted); A astigmatism; H hyperopia (farsighted); P presbyopia; Mrx spectacle prescription;  CTL contact lenses; OD right eye; OS left eye; OU both eyes  XT exotropia; ET esotropia; PEK punctate epithelial keratitis; PEE punctate epithelial erosions; DES dry eye syndrome; MGD meibomian gland dysfunction; ATs artificial tears; PFAT's preservative free artificial tears; [place] nuclear sclerotic cataract; PSC posterior subcapsular cataract; ERM epi-retinal membrane; PVD posterior vitreous detachment; RD retinal detachment; DM diabetes mellitus; DR diabetic retinopathy; NPDR non-proliferative diabetic retinopathy; PDR proliferative diabetic retinopathy; CSME clinically significant macular edema; DME diabetic macular edema; dbh dot blot hemorrhages; CWS cotton wool spot; POAG primary open angle glaucoma; C/D cup-to-disc ratio; HVF humphrey visual field; GVF goldmann visual field; OCT optical coherence tomography; IOP intraocular pressure; BRVO Branch retinal vein occlusion; CRVO central retinal vein occlusion; CRAO central retinal artery occlusion; BRAO branch retinal artery occlusion; RT retinal tear; SB scleral buckle; PPV pars plana vitrectomy; VH Vitreous hemorrhage; PRP panretinal laser photocoagulation; IVK intravitreal  kenalog; VMT vitreomacular traction; MH Macular hole;  NVD neovascularization of the disc; NVE neovascularization elsewhere; AREDS age related eye disease study; ARMD age related macular degeneration; POAG primary open angle glaucoma; EBMD epithelial/anterior basement membrane dystrophy; ACIOL anterior chamber intraocular lens; IOL intraocular lens; PCIOL posterior chamber intraocular lens; Phaco/IOL phacoemulsification with intraocular lens placement; [place] photorefractive keratectomy; LASIK laser assisted in situ keratomileusis; HTN hypertension; DM diabetes mellitus; COPD chronic obstructive pulmonary disease

## 2022-08-29 ENCOUNTER — Encounter (INDEPENDENT_AMBULATORY_CARE_PROVIDER_SITE_OTHER): Payer: 59 | Admitting: Ophthalmology

## 2022-08-29 DIAGNOSIS — H35411 Lattice degeneration of retina, right eye: Secondary | ICD-10-CM

## 2022-08-29 DIAGNOSIS — H2511 Age-related nuclear cataract, right eye: Secondary | ICD-10-CM

## 2022-08-29 DIAGNOSIS — Z970 Presence of artificial eye: Secondary | ICD-10-CM

## 2022-08-29 DIAGNOSIS — Z9001 Acquired absence of eye: Secondary | ICD-10-CM

## 2022-08-29 DIAGNOSIS — C6932 Malignant neoplasm of left choroid: Secondary | ICD-10-CM

## 2022-08-31 ENCOUNTER — Other Ambulatory Visit: Payer: Self-pay

## 2022-08-31 ENCOUNTER — Other Ambulatory Visit: Payer: 59

## 2022-08-31 DIAGNOSIS — E890 Postprocedural hypothyroidism: Secondary | ICD-10-CM

## 2022-09-01 LAB — TSH: TSH: 0.37 mIU/L — ABNORMAL LOW

## 2022-09-05 ENCOUNTER — Other Ambulatory Visit: Payer: Self-pay

## 2022-09-06 ENCOUNTER — Other Ambulatory Visit (HOSPITAL_COMMUNITY): Payer: Self-pay

## 2022-09-06 DIAGNOSIS — H5211 Myopia, right eye: Secondary | ICD-10-CM | POA: Diagnosis not present

## 2022-09-07 ENCOUNTER — Other Ambulatory Visit: Payer: Self-pay | Admitting: Nurse Practitioner

## 2022-09-07 ENCOUNTER — Other Ambulatory Visit (HOSPITAL_COMMUNITY): Payer: Self-pay

## 2022-09-07 ENCOUNTER — Telehealth: Payer: Self-pay | Admitting: Nurse Practitioner

## 2022-09-07 MED ORDER — SYNTHROID 112 MCG PO TABS
112.0000 ug | ORAL_TABLET | Freq: Every day | ORAL | 1 refills | Status: DC
  Filled 2022-09-07: qty 90, 90d supply, fill #0
  Filled 2022-12-12: qty 90, 90d supply, fill #1

## 2022-09-07 NOTE — Telephone Encounter (Signed)
Patient is requesting a refill on Synthroid to [facility]

## 2022-09-16 ENCOUNTER — Other Ambulatory Visit (HOSPITAL_COMMUNITY): Payer: Self-pay

## 2022-09-24 ENCOUNTER — Encounter: Payer: Self-pay | Admitting: Nurse Practitioner

## 2022-09-28 NOTE — Progress Notes (Signed)
[facility] Note  10/03/2022     CHIEF COMPLAINT Patient presents for Retina Follow Up    HISTORY OF PRESENT ILLNESS: Tracy Kidd is a 52 y.o. female who presents to the clinic today for:   HPI     Retina Follow Up   Patient presents with  Other.  In left eye.  Severity is moderate.  Duration of 6 months.  Since onset it is stable.  I, the attending physician,  performed the HPI with the patient and updated documentation appropriately.        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 Pt states [facility] the same, no changes.       Last edited by Bernarda Caffey, MD on 10/03/2022  4:47 PM.    Pt saw Dr. Daralene Milch in June 2023, pt saw Dr. Ellie Lunch about a month ago as well, pt states [facility] wants her to have a retinal specialist closer by  Referring physician: Unk Pinto, MD [address] [place],  [place] [address]  HISTORICAL INFORMATION:   Selected notes from the [facility] Referred by Dr. Albina Billet for cataract surery clearance OS;  LEE- 03.01.19 ([facility]. 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   Polyethyl Glycol-Propyl Glycol (SYSTANE) 0.4-0.3 % SOLN Apply to eye.   No current facility-administered medications for this visit. (Ophthalmic Drugs)   Current Outpatient Medications (Other)  Medication Sig   Cetirizine HCl 10 MG CAPS    cholecalciferol (VITAMIN D) 1000 UNITS tablet Take 2,000 Units by mouth daily.   Ferrous Sulfate (IRON) 28 MG TABS Take by mouth daily.   levocetirizine (XYZAL) 5 MG tablet Take 5 mg by mouth every evening.   MELATONIN PO Take 1 tablet by mouth at  bedtime.   SYNTHROID 112 MCG tablet Take 1 tablet (112 mcg total) by mouth daily on an empty stomach with water at least 30 minutes before a meal.  NO ANTACID MEDS, CALCIUM OR MAGNESIUM FOR 4 HOURS. AVOID BIOTIN.   TURMERIC PO Take by mouth daily.   vitamin B-12 (CYANOCOBALAMIN) 100 MCG tablet Vitamin B12   Cyanocobalamin (VITAMIN B-12 PO) Take 1 tablet by mouth daily.   doxycycline (VIBRA-TABS) 100 MG tablet Take 1 tablet (100 mg total) by mouth 2 (two) times daily. (Patient not taking: Reported on 07/20/2022)   FLUoxetine (PROZAC) 10 MG capsule Take 1 capsule (10 mg total) by mouth daily.   fluticasone (FLONASE) 50 MCG/ACT nasal spray Place 2 sprays into both nostrils daily. (Patient not taking: Reported on 07/20/2022)   ondansetron (ZOFRAN) 4 MG tablet Take 1 tablet (4 mg total) by mouth every 8 (eight) hours as needed for nausea or vomiting. (Patient not taking: Reported on 07/20/2022)   predniSONE (DELTASONE) 50 MG tablet Take 1 tablet by mouth every 6 hours for three total doses. Take them 13 hours, 7 hours and 1 hour prior to the study. (Patient not taking: Reported on 07/20/2022)   No current facility-administered medications for this visit. (Other)   REVIEW OF SYSTEMS: ROS   Positive for: Eyes, Heme/Lymph Negative for: Constitutional, Gastrointestinal, Neurological, Skin, Genitourinary, Musculoskeletal, HENT, Endocrine,  Cardiovascular, Respiratory, Psychiatric, Allergic/Imm Last edited by Kingsley Spittle, COT on 10/03/2022  2:05 PM.     ALLERGIES Allergies  Allergen Reactions   Povidone-Iodine Swelling    Only in EYE, tolerates on skin   Latanoprost Other (See Comments)    Burns and stings, severe tearing   Oxycodone Nausea And Vomiting    Can take morphine and possibly Vicodin Can take morphine and possibly Vicodin   PAST MEDICAL HISTORY Past Medical History:  Diagnosis Date   Arthritis    hands and feet   Cancer (HCC)    ocular melanoma   Hypothyroidism    PONV  (postoperative nausea and vomiting)    past surgery Scop patch and propofol helped   Thyroid disease    Past Surgical History:  Procedure Laterality Date   EYE SURGERY     radiation plaque placement and removal   LAPAROSCOPY N/A 09/13/2013   Procedure: LAPAROSCOPY OPERATIVE, removal of pelvic mass;  Surgeon: Logan Bores, MD;  Location: [facility];  Service: Gynecology;  Laterality: N/A;   WISDOM TOOTH EXTRACTION     FAMILY HISTORY Family History  Problem Relation Age of Onset   Varicose Veins Mother    Arthritis Mother    Cancer Mother        Melanoma   Nephrolithiasis Father    Hyperlipidemia Father    Autoimmune disease Father        atrophic gastritis/B12 def   Cancer Maternal Grandmother        Lung   Heart disease Maternal Grandfather        CHF   Cancer Maternal Aunt 70       melanoma   SOCIAL HISTORY Social History   Tobacco Use   Smoking status: Never   Smokeless tobacco: Never  Vaping Use   Vaping Use: Never used  Substance Use Topics   Alcohol use: Yes    Comment: rare   Drug use: No       OPHTHALMIC EXAM:  Base Eye Exam     Visual Acuity (Snellen - Linear)       Right Left   Dist cc 20/20     Correction: Contacts         Tonometry (Tonopen, 2:10 PM)       Right Left   Pressure 17          Pupils       Pupils Dark Light Shape React APD   Right PERRL 3 2 Round Brisk None   Left PERRL              Visual Fields       Left Right     Full         Extraocular Movement       Right Left    Full, Ortho Full, Ortho         Neuro/Psych     Oriented x3: Yes   Mood/Affect: Normal         Dilation     Right eye: 1.0% Mydriacyl, 2.5% Phenylephrine @ 2:11 PM           Slit Lamp and Fundus Exam     Slit Lamp Exam       Right Left   Lids/Lashes Dermatochalasis - upper lid normal, mild MGD   Conjunctiva/Sclera White and quiet pink, prosthetic   Cornea Clear    Anterior Chamber deep and clear    Iris Round  and dilated  Lens 1-2+ Nuclear sclerosis, 1+ Cortical cataract    Anterior Vitreous Mild syneresis          Fundus Exam       Right Left   Disc Pink and Sharp, Compact    C/D Ratio 0.35    Macula Flat, Good foveal reflex, RPE mottling, No heme or edema    Vessels Normal    Periphery Attached, Lattice degeneration with good laser surrounding at 0730 and 1030-1130, no new RT/RD or lattice            IMAGING AND PROCEDURES  Imaging and Procedures for 12/05/17  OCT, Retina - OU - Both Eyes       Right Eye Quality was good. Central Foveal Thickness: 257. Progression has been stable. Findings include normal foveal contour, no IRF, no SRF, vitreomacular adhesion .   Notes *Images captured and stored on drive  Diagnosis / Impression:  OD: NFP, No IRF/SRF OS: no images -- prosthetic, s/p enucleation  Clinical management:  See below  Abbreviations: NFP - Normal foveal profile. CME - cystoid macular edema. PED - pigment epithelial detachment. IRF - intraretinal fluid. SRF - subretinal fluid. EZ - ellipsoid zone. ERM - epiretinal membrane. ORA - outer retinal atrophy. ORT - outer retinal tubulation. SRHM - subretinal hyper-reflective material             ASSESSMENT/PLAN:    ICD-10-CM   1. 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 H/O enucleation of left eyeball  Z90.01     3. 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 Lattice degeneration of peripheral retina, right  H35.411 OCT, Retina - OU - Both Eyes    5. Nuclear sclerosis, right  H25.11      1-3. History of choroidal melanoma s/p I-125 brachytherapy 2014 OS  - underwent plaque therapy at [facility] [place] in 2014 - history of multiple IVA injections post plaque, q6 mos - follows with Dr. Daralene Milch - developed recurrence of choroidal melanoma with vitreous hemorrhage in December 2020 - underwent vitrectomy and then enucleation OS in Jan of 2021 - prosthetic  now in place -- looks great - recommend polycarbonate glasses - f/u 1 year, DFE, OCT  4. Lattice degeneration, OD - patches at 0730 and 1030-1130 - s/p laser retinopexy with Dr. Zigmund Daniel - good laser in place - no new lattice, RT/RD - monitor  5. Nuclear sclerosis, OD - The symptoms of cataract, surgical options, and treatments and risks were discussed with patient. - discussed diagnosis and progression - not yet visually significant - monitor  Ophthalmic Meds Ordered this visit:  No orders of the defined types were placed in this encounter.    Return in about 1 year (around 10/04/2023) for f/u choroidal melanoma OS, DFE, OCT.  There are no Patient Instructions on file for this visit.   Explained the diagnoses, plan, and follow up with the patient and they expressed understanding.  Patient expressed understanding of the importance of proper follow up care.   This document serves as a record of services personally performed by Gardiner Sleeper, MD, PhD. It was created on their behalf by Orvan Falconer, an ophthalmic technician. The creation of this record is the provider's dictation and/or activities during the visit.    Electronically signed by: Orvan Falconer, OA, 10/03/22  4:49 PM  This document serves as a record of services personally performed by Aaron Edelman  Antony Haste, MD, PhD. It was created on their behalf by San Jetty. Owens Shark, OA an ophthalmic technician. The creation of this record is the provider's dictation and/or activities during the visit.    Electronically signed by: San Jetty. Marguerita Merles 01.08.2024 4:49 PM  Gardiner Sleeper, M.D., Ph.D. Diseases & Surgery of the Retina and Vitreous [facility]  I have reviewed the above documentation for accuracy and completeness, and I agree with the above. Gardiner Sleeper, M.D., Ph.D. 10/03/22 4:49 PM  Abbreviations: M myopia (nearsighted); A astigmatism; H hyperopia (farsighted); P presbyopia; Mrx spectacle  prescription;  CTL contact lenses; OD right eye; OS left eye; OU both eyes  XT exotropia; ET esotropia; PEK punctate epithelial keratitis; PEE punctate epithelial erosions; DES dry eye syndrome; MGD meibomian gland dysfunction; ATs artificial tears; PFAT's preservative free artificial tears; [place] nuclear sclerotic cataract; PSC posterior subcapsular cataract; ERM epi-retinal membrane; PVD posterior vitreous detachment; RD retinal detachment; DM diabetes mellitus; DR diabetic retinopathy; NPDR non-proliferative diabetic retinopathy; PDR proliferative diabetic retinopathy; CSME clinically significant macular edema; DME diabetic macular edema; dbh dot blot hemorrhages; CWS cotton wool spot; POAG primary open angle glaucoma; C/D cup-to-disc ratio; HVF humphrey visual field; GVF goldmann visual field; OCT optical coherence tomography; IOP intraocular pressure; BRVO Branch retinal vein occlusion; CRVO central retinal vein occlusion; CRAO central retinal artery occlusion; BRAO branch retinal artery occlusion; RT retinal tear; SB scleral buckle; PPV pars plana vitrectomy; VH Vitreous hemorrhage; PRP panretinal laser photocoagulation; IVK intravitreal kenalog; VMT vitreomacular traction; MH Macular hole;  NVD neovascularization of the disc; NVE neovascularization elsewhere; AREDS age related eye disease study; ARMD age related macular degeneration; POAG primary open angle glaucoma; EBMD epithelial/anterior basement membrane dystrophy; ACIOL anterior chamber intraocular lens; IOL intraocular lens; PCIOL posterior chamber intraocular lens; Phaco/IOL phacoemulsification with intraocular lens placement; [place] photorefractive keratectomy; LASIK laser assisted in situ keratomileusis; HTN hypertension; DM diabetes mellitus; COPD chronic obstructive pulmonary disease

## 2022-10-03 ENCOUNTER — Ambulatory Visit (INDEPENDENT_AMBULATORY_CARE_PROVIDER_SITE_OTHER): Payer: 59 | Admitting: Ophthalmology

## 2022-10-03 ENCOUNTER — Encounter (INDEPENDENT_AMBULATORY_CARE_PROVIDER_SITE_OTHER): Payer: Self-pay | Admitting: Ophthalmology

## 2022-10-03 DIAGNOSIS — Z9001 Acquired absence of eye: Secondary | ICD-10-CM | POA: Diagnosis not present

## 2022-10-03 DIAGNOSIS — H35411 Lattice degeneration of retina, right eye: Secondary | ICD-10-CM | POA: Diagnosis not present

## 2022-10-03 DIAGNOSIS — H2511 Age-related nuclear cataract, right eye: Secondary | ICD-10-CM | POA: Diagnosis not present

## 2022-10-03 DIAGNOSIS — Z970 Presence of artificial eye: Secondary | ICD-10-CM

## 2022-10-03 DIAGNOSIS — C6932 Malignant neoplasm of left choroid: Secondary | ICD-10-CM

## 2022-11-17 ENCOUNTER — Other Ambulatory Visit: Payer: Self-pay | Admitting: Nurse Practitioner

## 2022-11-17 ENCOUNTER — Other Ambulatory Visit (HOSPITAL_COMMUNITY): Payer: Self-pay

## 2022-11-17 DIAGNOSIS — F419 Anxiety disorder, unspecified: Secondary | ICD-10-CM

## 2022-11-17 MED ORDER — FLUOXETINE HCL 10 MG PO CAPS
10.0000 mg | ORAL_CAPSULE | Freq: Every day | ORAL | 0 refills | Status: DC
  Filled 2022-11-17: qty 21, 21d supply, fill #0

## 2022-11-24 DIAGNOSIS — C439 Malignant melanoma of skin, unspecified: Secondary | ICD-10-CM | POA: Diagnosis not present

## 2022-11-24 DIAGNOSIS — R918 Other nonspecific abnormal finding of lung field: Secondary | ICD-10-CM | POA: Diagnosis not present

## 2022-11-24 DIAGNOSIS — C6932 Malignant neoplasm of left choroid: Secondary | ICD-10-CM | POA: Diagnosis not present

## 2022-11-28 DIAGNOSIS — C6932 Malignant neoplasm of left choroid: Secondary | ICD-10-CM | POA: Diagnosis not present

## 2022-12-12 ENCOUNTER — Other Ambulatory Visit: Payer: Self-pay | Admitting: Nurse Practitioner

## 2022-12-12 ENCOUNTER — Other Ambulatory Visit (HOSPITAL_COMMUNITY): Payer: Self-pay

## 2022-12-12 DIAGNOSIS — F419 Anxiety disorder, unspecified: Secondary | ICD-10-CM

## 2022-12-12 MED ORDER — FLUOXETINE HCL 10 MG PO CAPS
10.0000 mg | ORAL_CAPSULE | Freq: Every day | ORAL | 0 refills | Status: DC
  Filled 2022-12-12: qty 21, 21d supply, fill #0

## 2022-12-13 ENCOUNTER — Other Ambulatory Visit (HOSPITAL_COMMUNITY): Payer: Self-pay

## 2023-01-05 ENCOUNTER — Other Ambulatory Visit (HOSPITAL_COMMUNITY): Payer: Self-pay

## 2023-01-05 ENCOUNTER — Other Ambulatory Visit: Payer: Self-pay | Admitting: Nurse Practitioner

## 2023-01-05 DIAGNOSIS — F419 Anxiety disorder, unspecified: Secondary | ICD-10-CM

## 2023-01-05 MED ORDER — FLUOXETINE HCL 10 MG PO CAPS
10.0000 mg | ORAL_CAPSULE | Freq: Every day | ORAL | 0 refills | Status: DC
  Filled 2023-01-05: qty 21, 21d supply, fill #0

## 2023-01-19 NOTE — Progress Notes (Signed)
6 MONTH FOLLOW UP  Assessment and Plan: Tracy Kidd was seen today for 6 month follow up  Diagnoses and all orders for this visit:   Aortic atherosclerosis (HCC) Control blood pressure, cholesterol, glucose, increase exercise.  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 Continue to follow at [facility] and [facility] to monitor liver Last CT showed increased size of lung nodules, has repeat upcoming CT Schedule derm evaluation  Hyperlipidemia, unspecified hyperlipidemia type -     COMPLETE METABOLIC PANEL WITH GFR -     Lipid panel - Continue diet and exercise  Medication management -     CBC with Differential/Platelet -     COMPLETE METABOLIC PANEL WITH GFR -     Lipid panel -     TSH  Screening  for STD - CT/Gonorrhea - HIV - RPR - HSV 1+2  Vitamin D deficiency Continue Vit D supplementation to maintain value in therapeutic level of 60-100    Anemia, unspecified type -     CBC with Differential/Platelet  Anxiety Feels stress is well controlled currently and would like to wean off medication Tried to go off but is on Prozac 10 mg qd and doing well If symptoms return notify the office  Obesity BMI 31 Long discussion about weight loss, diet, and exercise Recommended diet heavy in fruits and veggies and low in animal meats, cheeses, and dairy products, appropriate calorie intake Patient will work on decreasing simple carbs and saturated fats, increase exercise Follow up at next visit  Chronic knee pain - Tylenol and heat as needed Monitor and if persists or worsens will order xray  Discussed med's effects and SE's. Screening labs and tests as requested with regular follow-up as recommended.  HPI 52 y.o. female  presents for a complete physical. has Malignant neoplasm of choroid (HCC); Vitamin D deficiency; Hyperlipidemia;  Medication management; Anemia; B12 deficiency; Psoriasis; Corticosteroid-induced glaucoma; Arthropathia; Claustrophobia; Hypothyroidism following radioiodine therapy; Lattice degeneration; Error, refractive, myopia; Aortic atherosclerosis (HCC); and H/O enucleation of left eyeball on their problem list.   Her blood pressure has been controlled at home, today their BP is BP: 132/72  BP Readings from Last 3 Encounters:  01/23/23 132/72  07/20/22 138/84  06/21/21 132/82  She denies headaches, chest pain, shortness of breath and dizziness.   She has split from her husband and is interested in STD testing, he had an affair. Is currently in a monogamous relationship. She is keeping the house.    She was diagnosed with ocular melanoma, left eye in 2014 and has been seeing a specialist at [facility], Dr. Lavella Hammock but now following with Dr. Pearletha Furl, she had radiation and subsequent glaucoma, following with Dr. Honor Loh.  Had cataract surgery in June 2019.   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 Now having CT AB with contrast q 3 months and following closely with [facility].  She had recent grafting of liposection of AB tissue for her left eye. Last CT showed more nodules in lungs and some have grown , was 8 weeks ago and goes Thursday for repeat, believed not metastasis  Had Pap with Dr. Senaida Ores. 10/2021. She continues to have heavy periods with clots and cramping.  Has fibroids and is followed by Dr. Senaida Ores. She will skip a period but still get  monthly cycles that alternate between heavy and light and some   She had been seeing Dr. [place] every 6 months for skin checks but is off schedule. Need to call and schedule an appointment.  She is on Prozac 10 mg daily and symptoms are well controlled.   BMI is Body mass index is 32.03 kg/m., she is working on diet and exercise.   Wt Readings from Last 3 Encounters:  01/23/23  180 lb 12.8 oz (82 kg)  07/20/22 176 lb 12.8 oz (80.2 kg)  06/21/21 172 lb (78 kg)   She does not workout. She denies chest pain, shortness of breath, dizziness.  She is not on cholesterol medication and denies myalgias. Her cholesterol is not at goal. The cholesterol last visit was:  Lab Results  Component Value Date   CHOL 219 (H) 07/20/2022   HDL 47 (L) 07/20/2022   LDLCALC 141 (H) 07/20/2022   TRIG 179 (H) 07/20/2022   CHOLHDL 4.7 07/20/2022    Last A1C in the office was:  Lab Results  Component Value Date   HGBA1C 5.5 07/20/2022   Patient is on Vitamin D supplement, 10,000 daily, will miss some.  Lab Results  Component Value Date   VD25OH 25 (L) 07/20/2022   She has a B12 but does not take it regularly, takes sublingual takes 2 a day.  Lab Results  Component Value Date   VITAMINB12 1,421 (H) 06/19/2019   She is on thyroid medication, levothyroxine 112 mcg daily. Her medication was not changed last visit.  She is not on biotin.  Lab Results  Component Value Date   TSH 0.37 (L) 08/31/2022  .  She is not on iron regularlly Lab Results  Component Value Date   IRON 53 06/18/2020   TIBC 389 06/18/2020   FERRITIN 11 (L) 06/18/2020     Current Medications:   Current Outpatient Medications (Endocrine & Metabolic):    SYNTHROID 112 MCG tablet, Take 1 tablet (112 mcg total) by mouth daily on an empty stomach with water at least 30 minutes before a meal.  NO ANTACID MEDS, CALCIUM OR MAGNESIUM FOR 4 HOURS. AVOID BIOTIN.   predniSONE (DELTASONE) 50 MG tablet, Take 1 tablet by mouth every 6 hours for three total doses. Take them 13 hours, 7 hours and 1 hour prior to the study. (Patient not taking: Reported on 07/20/2022)   Current Outpatient Medications (Respiratory):    levocetirizine (XYZAL) 5 MG tablet, Take 5 mg by mouth every evening.   Cetirizine HCl 10 MG CAPS,    fluticasone (FLONASE) 50 MCG/ACT nasal spray, Place 2 sprays into both nostrils daily. (Patient not  taking: Reported on 01/23/2023)   Current Outpatient Medications (Hematological):    Ferrous Sulfate (IRON) 28 MG TABS, Take by mouth daily.   vitamin B-12 (CYANOCOBALAMIN) 100 MCG tablet, Vitamin B12   Cyanocobalamin (VITAMIN B-12 PO), Take 1 tablet by mouth daily.  Current Outpatient Medications (Other):    carboxymethylcellulose (REFRESH PLUS) 0.5 % SOLN, Apply to eye.   cholecalciferol (VITAMIN D) 1000 UNITS tablet, Take 2,000 Units by mouth daily.   FLUoxetine (PROZAC) 10 MG capsule, Take 1 capsule (10 mg total) by mouth daily.   KRILL OIL PO, Take by mouth.   MELATONIN PO, Take 1 tablet by mouth at bedtime.   Polyethyl Glycol-Propyl Glycol (SYSTANE) 0.4-0.3 % SOLN, Apply to eye.   TURMERIC PO, Take by mouth daily.   doxycycline (VIBRA-TABS) 100 MG tablet, Take 1 tablet (100 mg total) by mouth 2 (  two) times daily. (Patient not taking: Reported on 07/20/2022)   ondansetron (ZOFRAN) 4 MG tablet, Take 1 tablet (4 mg total) by mouth every 8 (eight) hours as needed for nausea or vomiting. (Patient not taking: Reported on 07/20/2022)  Health Maintenance:   Immunization History  Administered Date(s) Administered   Influenza,inj,Quad PF,6+ Mos 05/28/2015   Influenza-Unspecified 07/04/2022   PFIZER(Purple Top)SARS-COV-2 Vaccination 01/17/2020, 02/14/2020   Tdap 08/12/2014   TDAP: 2015  Pneumovax: N/A Prevnar 13: N/A Flu vaccine: 2014 Zostavax: N/A COVID vaccines complete Pap: 11/22/21 MGM: 12/14/21 Colonoscopy: due age 39, considering cologuard EGD:  Medical History:  Past Medical History:  Diagnosis Date   Arthritis    hands and feet   Cancer (HCC)    ocular melanoma   Hypothyroidism    PONV (postoperative nausea and vomiting)    past surgery Scop patch and propofol helped   Thyroid disease    Allergies Allergies  Allergen Reactions   Povidone-Iodine Swelling    Only in EYE, tolerates on skin   Latanoprost Other (See Comments)    Burns and stings, severe tearing    Oxycodone Nausea And Vomiting    Can take morphine and possibly Vicodin Can take morphine and possibly Vicodin   SURGICAL HISTORY She  has a past surgical history that includes Wisdom tooth extraction; Eye surgery; and laparoscopy (N/A, 09/13/2013).   FAMILY HISTORY Her family history includes Arthritis in her mother; Autoimmune disease in her father; Cancer in her maternal grandmother and mother; Cancer (age of onset: 61) in her maternal aunt; Heart disease in her maternal grandfather; Hyperlipidemia in her father; Nephrolithiasis in her father; Varicose Veins in her mother.   SOCIAL HISTORY She  reports that she has never smoked. She has never used smokeless tobacco. She reports current alcohol use. She reports that she does not use drugs.  Review of Systems  Constitutional: Negative.  Negative for chills and fever.  HENT:  Negative for congestion (allergies, on allegra), ear discharge, ear pain, hearing loss, nosebleeds, sinus pain, sore throat and tinnitus.   Eyes:  Negative for blurred vision, double vision, photophobia, pain, discharge and redness.  Respiratory: Negative.  Negative for cough, hemoptysis, sputum production, shortness of breath, wheezing and stridor.   Cardiovascular:  Negative for chest pain, palpitations and leg swelling.  Gastrointestinal: Negative.  Negative for abdominal pain, constipation, diarrhea, heartburn, nausea and vomiting.  Genitourinary: Negative.  Negative for dysuria and urgency.  Musculoskeletal:  Positive for joint pain (knees bilaterally). Negative for back pain, falls, myalgias and neck pain.  Skin: Negative.  Negative for itching and rash.  Neurological: Negative.  Negative for dizziness, tingling, tremors, weakness and headaches.  Endo/Heme/Allergies: Negative.  Does not bruise/bleed easily.  Psychiatric/Behavioral:  Negative for depression and suicidal ideas. The patient is nervous/anxious. The patient does not have insomnia.     Physical  Exam: Estimated body mass index is 32.03 kg/m as calculated from the following:   Height as of this encounter: 5\' 3"  (1.6 m).   Weight as of this encounter: 180 lb 12.8 oz (82 kg). BP 132/72   Pulse 87   Temp (!) 97.5 F (36.4 C)   Ht 5\' 3"  (1.6 m)   Wt 180 lb 12.8 oz (82 kg)   SpO2 97%   BMI 32.03 kg/m  General Appearance: Well nourished, in no apparent distress. Eyes: PERRLA, EOMs, left  ptosis noted-has prosthetic, conjunctiva no swelling or erythema Sinuses: No Frontal/maxillary tenderness ENT/Mouth: Ext aud canals clear, normal light reflex with  TMs without erythema, bulging.  Good dentition. No erythema, swelling, or exudate on post pharynx. Tonsils not swollen or erythematous. Hearing normal.  Neck: Supple, thyroid normal. No bruits Respiratory: Respiratory effort normal, BS equal bilaterally without rales, rhonchi, wheezing or stridor. Cardio: RRR without murmurs, rubs or gallops. Brisk peripheral pulses without edema.  Chest: symmetric, with normal excursions and percussion. Breasts: defer Abdomen: Soft, +BS. Non tender, no guarding, rebound, hernias, masses, or organomegaly.  Lymphatics: Non tender without lymphadenopathy.  Genitourinary: defer Musculoskeletal: Full ROM all peripheral extremities,5/5 strength. Mild tenderness with pressure of medial collateral ligament R > L Skin:  Warm, dry without rashes, lesions, ecchymosis.  Neuro: Cranial nerves intact, reflexes equal bilaterally. Normal muscle tone, no cerebellar symptoms. Sensation intact.  Psych: Awake and oriented X 3, normal affect, Insight and Judgment appropriate.      [facility]  01/23/2023

## 2023-01-23 ENCOUNTER — Encounter: Payer: Self-pay | Admitting: Nurse Practitioner

## 2023-01-23 ENCOUNTER — Ambulatory Visit (INDEPENDENT_AMBULATORY_CARE_PROVIDER_SITE_OTHER): Payer: 59 | Admitting: Nurse Practitioner

## 2023-01-23 VITALS — BP 132/72 | HR 87 | Temp 97.5°F | Ht 63.0 in | Wt 180.8 lb

## 2023-01-23 DIAGNOSIS — E559 Vitamin D deficiency, unspecified: Secondary | ICD-10-CM

## 2023-01-23 DIAGNOSIS — F419 Anxiety disorder, unspecified: Secondary | ICD-10-CM

## 2023-01-23 DIAGNOSIS — E785 Hyperlipidemia, unspecified: Secondary | ICD-10-CM

## 2023-01-23 DIAGNOSIS — I700 Atherosclerosis of aorta: Secondary | ICD-10-CM | POA: Diagnosis not present

## 2023-01-23 DIAGNOSIS — Z6831 Body mass index (BMI) 31.0-31.9, adult: Secondary | ICD-10-CM

## 2023-01-23 DIAGNOSIS — D649 Anemia, unspecified: Secondary | ICD-10-CM | POA: Diagnosis not present

## 2023-01-23 DIAGNOSIS — Z113 Encounter for screening for infections with a predominantly sexual mode of transmission: Secondary | ICD-10-CM | POA: Diagnosis not present

## 2023-01-23 DIAGNOSIS — E6609 Other obesity due to excess calories: Secondary | ICD-10-CM

## 2023-01-23 DIAGNOSIS — E890 Postprocedural hypothyroidism: Secondary | ICD-10-CM | POA: Diagnosis not present

## 2023-01-23 DIAGNOSIS — C6932 Malignant neoplasm of left choroid: Secondary | ICD-10-CM | POA: Diagnosis not present

## 2023-01-23 DIAGNOSIS — Z79899 Other long term (current) drug therapy: Secondary | ICD-10-CM

## 2023-01-23 NOTE — Patient Instructions (Signed)

## 2023-01-24 ENCOUNTER — Other Ambulatory Visit: Payer: Self-pay | Admitting: Nurse Practitioner

## 2023-01-24 LAB — HSV(HERPES SIMPLEX VRS) I + II AB-IGG: HAV 1 IGG,TYPE SPECIFIC AB: <0.9 index

## 2023-01-24 MED ORDER — SYNTHROID 112 MCG PO TABS: 112.0000 ug | ORAL_TABLET | Freq: Every day | ORAL | 1 refills | Status: DC

## 2023-01-25 LAB — COMPLETE METABOLIC PANEL WITH GFR
AG Ratio: 1.5 (calc) (ref 1.0–2.5)
ALT: 9 U/L (ref 6–29)
AST: 10 U/L (ref 10–35)
Albumin: 4.1 g/dL (ref 3.6–5.1)
Alkaline phosphatase (APISO): 102 U/L (ref 37–153)
BUN: 11 mg/dL (ref 7–25)
CO2: 27 mmol/L (ref 20–32)
Calcium: 9.3 mg/dL (ref 8.6–10.4)
Chloride: 107 mmol/L (ref 98–110)
Creat: 0.61 mg/dL (ref 0.50–1.03)
Globulin: 2.8 g/dL (calc) (ref 1.9–3.7)
Glucose, Bld: 91 mg/dL (ref 65–99)
Potassium: 4.3 mmol/L (ref 3.5–5.3)
Sodium: 141 mmol/L (ref 135–146)
Total Bilirubin: 0.3 mg/dL (ref 0.2–1.2)
Total Protein: 6.9 g/dL (ref 6.1–8.1)
eGFR: 109 mL/min/{1.73_m2} (ref >=60)

## 2023-01-25 LAB — CBC WITH DIFFERENTIAL/PLATELET
Absolute Monocytes: 485 cells/uL (ref 200–950)
Basophils Absolute: 51 cells/uL (ref 0–200)
Basophils Relative: 0.6 %
Eosinophils Absolute: 43 cells/uL (ref 15–500)
Eosinophils Relative: 0.5 %
HCT: 39.2 % (ref 35.0–45.0)
Hemoglobin: 13 g/dL (ref 11.7–15.5)
Lymphs Abs: 1666 cells/uL (ref 850–3900)
MCH: 29.1 pg (ref 27.0–33.0)
MCHC: 33.2 g/dL (ref 32.0–36.0)
MCV: 87.7 fL (ref 80.0–100.0)
MPV: 9.6 fL (ref 7.5–12.5)
Monocytes Relative: 5.7 %
Neutro Abs: 6256 cells/uL (ref 1500–7800)
Neutrophils Relative %: 73.6 %
Platelets: 301 10*3/uL (ref 140–400)
RBC: 4.47 10*6/uL (ref 3.80–5.10)
RDW: 13.2 % (ref 11.0–15.0)
Total Lymphocyte: 19.6 %
WBC: 8.5 10*3/uL (ref 3.8–10.8)

## 2023-01-25 LAB — C. TRACHOMATIS/N. GONORRHOEAE RNA
C. trachomatis RNA, TMA: NOT DETECTED
N. gonorrhoeae RNA, TMA: NOT DETECTED

## 2023-01-25 LAB — HSV(HERPES SIMPLEX VRS) I + II AB-IGG: HSV 2 IGG,TYPE SPECIFIC AB: <0.9 index

## 2023-01-25 LAB — LIPID PANEL
Cholesterol: 203 mg/dL — ABNORMAL HIGH (ref <200)
HDL: 49 mg/dL — ABNORMAL LOW (ref >=50)
LDL Cholesterol (Calc): 124 mg/dL (calc) — ABNORMAL HIGH
Non-HDL Cholesterol (Calc): 154 mg/dL (calc) — ABNORMAL HIGH (ref <130)
Total CHOL/HDL Ratio: 4.1 (calc) (ref <5.0)
Triglycerides: 179 mg/dL — ABNORMAL HIGH (ref <150)

## 2023-01-25 LAB — RPR: RPR Ser Ql: NONREACTIVE

## 2023-01-25 LAB — HIV ANTIBODY (ROUTINE TESTING W REFLEX): HIV 1&2 Ab, 4th Generation: NONREACTIVE

## 2023-01-25 LAB — TSH: TSH: 6.35 mIU/L — ABNORMAL HIGH

## 2023-01-26 ENCOUNTER — Other Ambulatory Visit: Payer: Self-pay | Admitting: Nurse Practitioner

## 2023-01-26 DIAGNOSIS — F419 Anxiety disorder, unspecified: Secondary | ICD-10-CM

## 2023-01-26 DIAGNOSIS — R911 Solitary pulmonary nodule: Secondary | ICD-10-CM | POA: Diagnosis not present

## 2023-01-26 DIAGNOSIS — C6932 Malignant neoplasm of left choroid: Secondary | ICD-10-CM | POA: Diagnosis not present

## 2023-01-26 DIAGNOSIS — R918 Other nonspecific abnormal finding of lung field: Secondary | ICD-10-CM | POA: Diagnosis not present

## 2023-01-27 ENCOUNTER — Other Ambulatory Visit (HOSPITAL_COMMUNITY): Payer: Self-pay

## 2023-01-27 MED ORDER — FLUOXETINE HCL 10 MG PO CAPS
10.0000 mg | ORAL_CAPSULE | Freq: Every day | ORAL | 0 refills | Status: DC
Start: 2023-01-27 — End: 2023-02-21
  Filled 2023-01-27: qty 21, 21d supply, fill #0

## 2023-01-29 ENCOUNTER — Encounter: Payer: Self-pay | Admitting: Nurse Practitioner

## 2023-01-30 ENCOUNTER — Other Ambulatory Visit: Payer: Self-pay | Admitting: Nurse Practitioner

## 2023-01-30 ENCOUNTER — Other Ambulatory Visit (HOSPITAL_COMMUNITY): Payer: Self-pay

## 2023-01-30 DIAGNOSIS — E785 Hyperlipidemia, unspecified: Secondary | ICD-10-CM

## 2023-01-30 MED ORDER — ROSUVASTATIN CALCIUM 5 MG PO TABS
5.0000 mg | ORAL_TABLET | Freq: Every day | ORAL | 11 refills | Status: DC
Start: 2023-01-30 — End: 2024-03-14
  Filled 2023-01-30: qty 30, 30d supply, fill #0
  Filled 2023-03-03: qty 30, 30d supply, fill #1
  Filled 2023-04-11: qty 30, 30d supply, fill #2
  Filled 2023-05-16: qty 30, 30d supply, fill #3
  Filled 2023-06-14: qty 30, 30d supply, fill #4
  Filled 2023-07-18: qty 30, 30d supply, fill #5
  Filled 2023-08-23: qty 30, 30d supply, fill #6
  Filled 2023-09-29: qty 30, 30d supply, fill #7
  Filled 2023-11-07: qty 30, 30d supply, fill #8
  Filled 2023-12-19: qty 30, 30d supply, fill #9
  Filled 2024-01-22: qty 30, 30d supply, fill #10

## 2023-01-31 DIAGNOSIS — C6932 Malignant neoplasm of left choroid: Secondary | ICD-10-CM | POA: Diagnosis not present

## 2023-02-08 DIAGNOSIS — Z1389 Encounter for screening for other disorder: Secondary | ICD-10-CM | POA: Diagnosis not present

## 2023-02-08 DIAGNOSIS — Z1231 Encounter for screening mammogram for malignant neoplasm of breast: Secondary | ICD-10-CM | POA: Diagnosis not present

## 2023-02-08 DIAGNOSIS — Z202 Contact with and (suspected) exposure to infections with a predominantly sexual mode of transmission: Secondary | ICD-10-CM | POA: Diagnosis not present

## 2023-02-08 DIAGNOSIS — Z01411 Encounter for gynecological examination (general) (routine) with abnormal findings: Secondary | ICD-10-CM | POA: Diagnosis not present

## 2023-02-08 DIAGNOSIS — Z1211 Encounter for screening for malignant neoplasm of colon: Secondary | ICD-10-CM | POA: Diagnosis not present

## 2023-02-21 ENCOUNTER — Other Ambulatory Visit: Payer: Self-pay | Admitting: Nurse Practitioner

## 2023-02-21 DIAGNOSIS — F419 Anxiety disorder, unspecified: Secondary | ICD-10-CM

## 2023-02-21 MED ORDER — FLUOXETINE HCL 10 MG PO CAPS
10.0000 mg | ORAL_CAPSULE | Freq: Every day | ORAL | 0 refills | Status: DC
Start: 2023-02-21 — End: 2023-06-14
  Filled 2023-02-21: qty 90, 90d supply, fill #0

## 2023-02-22 ENCOUNTER — Other Ambulatory Visit (HOSPITAL_COMMUNITY): Payer: Self-pay

## 2023-02-23 ENCOUNTER — Other Ambulatory Visit (HOSPITAL_COMMUNITY): Payer: Self-pay

## 2023-03-14 DIAGNOSIS — C6932 Malignant neoplasm of left choroid: Secondary | ICD-10-CM | POA: Diagnosis not present

## 2023-03-15 ENCOUNTER — Other Ambulatory Visit (HOSPITAL_COMMUNITY): Payer: Self-pay

## 2023-03-15 ENCOUNTER — Other Ambulatory Visit: Payer: Self-pay | Admitting: Nurse Practitioner

## 2023-03-15 MED ORDER — SYNTHROID 112 MCG PO TABS
112.0000 ug | ORAL_TABLET | Freq: Every day | ORAL | 1 refills | Status: DC
  Filled 2023-03-15: qty 90, 90d supply, fill #0
  Filled 2023-06-14: qty 90, 90d supply, fill #1

## 2023-03-31 ENCOUNTER — Encounter (HOSPITAL_BASED_OUTPATIENT_CLINIC_OR_DEPARTMENT_OTHER): Payer: Self-pay | Admitting: Obstetrics and Gynecology

## 2023-04-04 ENCOUNTER — Encounter (HOSPITAL_BASED_OUTPATIENT_CLINIC_OR_DEPARTMENT_OTHER): Payer: Self-pay | Admitting: Obstetrics and Gynecology

## 2023-04-04 ENCOUNTER — Other Ambulatory Visit: Payer: Self-pay

## 2023-04-04 NOTE — Progress Notes (Signed)
Your procedure is scheduled on Wednesday, 04/26/23.  Report to [facility] AT  5:30 AM.   Call this number if you have problems the morning of surgery  :[phone].   OUR ADDRESS IS [address].  WE ARE LOCATED IN [facility].  PLEASE BRING YOUR INSURANCE CARD AND PHOTO ID DAY OF SURGERY.  ONLY 2 PEOPLE ARE ALLOWED IN  WAITING  ROOM                                      REMEMBER:  DO NOT EAT FOOD, CANDY GUM OR MINTS  AFTER MIDNIGHT THE NIGHT BEFORE YOUR SURGERY . YOU MAY HAVE CLEAR LIQUIDS FROM MIDNIGHT THE NIGHT BEFORE YOUR SURGERY UNTIL  4:30 AM. NO CLEAR LIQUIDS AFTER   4:30 AM DAY OF SURGERY.  YOU MAY  BRUSH YOUR TEETH MORNING OF SURGERY AND RINSE YOUR MOUTH OUT, NO CHEWING GUM CANDY OR MINTS.     CLEAR LIQUID DIET    Allowed      Water                                                                   Coffee and tea, regular and decaf  (NO cream or milk products of any type, may sweeten)                         Carbonated beverages, regular and diet                                    Sports drinks like Gatorade _____________________________________________________________________     TAKE ONLY THESE MEDICATIONS MORNING OF SURGERY: Synthroid, eye drops                                        DO NOT WEAR JEWERLY/  METAL/  PIERCINGS (INCLUDING NO PLASTIC PIERCINGS) DO NOT WEAR LOTIONS, POWDERS, PERFUMES OR NAIL POLISH ON YOUR FINGERNAILS. TOENAIL POLISH IS OK TO WEAR. DO NOT SHAVE FOR 48 HOURS PRIOR TO DAY OF SURGERY.  CONTACTS, GLASSES, OR DENTURES MAY NOT BE WORN TO SURGERY.  REMEMBER: NO SMOKING, VAPING ,  DRUGS OR ALCOHOL FOR 24 HOURS BEFORE YOUR SURGERY.                                    [facility] IS NOT RESPONSIBLE  FOR ANY BELONGINGS.                                                                    Marland Kitchen           [place] - Preparing for Surgery  Before surgery, you can play an important role.  Because skin is  not sterile, your skin needs to be as free of germs as possible.  You can reduce the number of germs on your skin by washing with CHG (chlorahexidine gluconate) soap before surgery.  CHG is an antiseptic cleaner which kills germs and bonds with the skin to continue killing germs even after washing. Please DO NOT use if you have an allergy to CHG or antibacterial soaps.  If your skin becomes reddened/irritated stop using the CHG and inform your nurse when you arrive at Short Stay. Do not shave (including legs and underarms) for at least 48 hours prior to the first CHG shower.  You may shave your face/neck. Please follow these instructions carefully:  1.  Shower with CHG Soap the night before surgery and the  morning of Surgery.  2.  If you choose to wash your hair, wash your hair first as usual with your  normal  shampoo.  3.  After you shampoo, rinse your hair and body thoroughly to remove the  shampoo.                                        4.  Use CHG as you would any other liquid soap.  You can apply chg directly  to the skin and wash , chg soap provided, night before and morning of your surgery.  5.  Apply the CHG Soap to your body ONLY FROM THE NECK DOWN.   Do not use on face/ open                           Wound or open sores. Avoid contact with eyes, ears mouth and genitals (private parts).                       Wash face,  Genitals (private parts) with your normal soap.             6.  Wash thoroughly, paying special attention to the area where your surgery  will be performed.  7.  Thoroughly rinse your body with warm water from the neck down.  8.  DO NOT shower/wash with your normal soap after using and rinsing off  the CHG Soap.             9.  Pat yourself dry with a clean towel.            10.  Wear clean pajamas.            11.  Place clean sheets on your bed the night of your first shower and do not  sleep with pets. Day of Surgery : Do not apply any lotions/ powders the morning of  surgery.  Please wear clean clothes to the hospital/surgery center.  IF YOU HAVE ANY SKIN IRRITATION OR PROBLEMS WITH THE SURGICAL SOAP, PLEASE GET A BAR OF GOLD DIAL SOAP AND SHOWER THE NIGHT BEFORE YOUR SURGERY AND THE MORNING OF YOUR SURGERY. PLEASE LET THE NURSE KNOW MORNING OF YOUR SURGERY IF YOU HAD ANY PROBLEMS WITH THE SURGICAL SOAP.   YOUR SURGEON MAY HAVE REQUESTED EXTENDED RECOVERY TIME AFTER YOUR SURGERY. IT COULD BE A  JUST A FEW HOURS  UP TO AN OVERNIGHT STAY.  YOUR SURGEON SHOULD HAVE DISCUSSED THIS WITH YOU PRIOR TO  YOUR SURGERY. IN THE EVENT YOU NEED TO STAY OVERNIGHT PLEASE REFER TO THE FOLLOWING GUIDELINES. YOU MAY HAVE UP TO 4 VISITORS  MAY VISIT IN THE EXTENDED RECOVERY ROOM UNTIL 800 PM ONLY.  ONE  VISITOR AGE 56 AND OVER MAY SPEND THE NIGHT AND MUST BE IN EXTENDED RECOVERY ROOM NO LATER THAN 800 PM . YOUR DISCHARGE TIME AFTER YOU SPEND THE NIGHT IS 900 AM THE MORNING AFTER YOUR SURGERY. YOU MAY PACK A SMALL OVERNIGHT BAG WITH TOILETRIES FOR YOUR OVERNIGHT STAY IF YOU WISH.  REGARDLESS OF IF YOU STAY OVER NIGHT OR ARE DISCHARGED THE SAME DAY YOU WILL BE REQUIRED TO HAVE A RESPONSIBLE ADULT (18 YRS OLD OR OLDER) STAY WITH YOU FOR AT LEAST THE FIRST 24 HOURS  YOUR PRESCRIPTION MEDICATIONS WILL BE PROVIDED DURING YOUR HOSPITAL STAY.  ________________________________________________________________________                                                        QUESTIONS Mechele Claude PRE OP NURSE PHONE [phone].

## 2023-04-04 NOTE — Progress Notes (Signed)
Spoke w/ via phone for pre-op interview---[facility] needs dos----  urine pregnancy             Lab results------04/24/23 lab appt for cbc, type & screen COVID test -----patient states asymptomatic no test needed Arrive at -------0530 on Wednesday, 04/26/23 NPO after MN NO Solid Food.  Clear liquids from MN until---0430 Med rec completed Medications to take morning of surgery -----Synthroid, eye drops Diabetic medication -----n/a Patient instructed no nail polish to be worn day of surgery Patient instructed to bring photo id and insurance card day of surgery Patient aware to have Driver (ride ) / caregiver    for 24 hours after surgery - mother, Okey Regal Patient Special Instructions -----Extended / overnight stay instructions given. Pre-Op special Instructions -----Patient has an artificial left eye. She states that when she is asleep her left eye does not close all the way. Patient verbalized understanding of instructions that were given at this phone interview. Patient denies shortness of breath, chest pain, fever, cough at this phone interview.

## 2023-04-10 DIAGNOSIS — Z01818 Encounter for other preprocedural examination: Secondary | ICD-10-CM | POA: Diagnosis not present

## 2023-04-22 NOTE — Progress Notes (Unsigned)
3 MONTH FOLLOW UP  Assessment and Plan: Tracy Kidd was seen today for 6 month follow up  Diagnoses and all orders for this visit:   Aortic atherosclerosis (HCC) Control blood pressure, cholesterol, glucose, increase exercise.  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 Continue to follow at [facility] and [facility] to monitor liver Last CT showed increased size of lung nodules, has repeat upcoming CT Schedule derm evaluation  Hyperlipidemia, unspecified hyperlipidemia type -     COMPLETE METABOLIC PANEL WITH GFR -     Lipid panel - Continue diet and exercise  Medication management -     COMPLETE METABOLIC PANEL WITH GFR -     Lipid panel -     TSH   Vitamin D deficiency Continue Vit D supplementation to maintain value in therapeutic level of 60-100  - Vit D   Anemia, unspecified type CBC was drawn at hospital today  Anxiety Feels stress is well controlled currently and would like to wean off medication Prozac 10 mg qd and doing well If symptoms return notify the office  Obesity BMI 32 Long discussion about weight loss, diet, and exercise Recommended diet heavy in fruits and veggies and low in animal meats, cheeses, and dairy products, appropriate calorie intake Patient will work on decreasing simple carbs and saturated fats, increase exercise Follow up at next visit    Discussed med's effects and SE's. Screening labs and tests as requested with regular follow-up as recommended.  HPI 52 y.o. female  presents for a complete physical. has Malignant neoplasm of choroid (HCC); Vitamin D deficiency; Hyperlipidemia; Medication management; Anemia; B12 deficiency; Psoriasis; Corticosteroid-induced glaucoma; Arthropathia; Claustrophobia; Hypothyroidism following radioiodine therapy; Lattice degeneration; Error, refractive, myopia; Aortic  atherosclerosis (HCC); and H/O enucleation of left eyeball on their problem list.   Her mood is doing well and is to have divorce finalized 08/2023. She is on Prozac 10 mg daily and symptoms are well controlled.   Her blood pressure has been controlled at home, today their BP is BP: 130/82  BP Readings from Last 3 Encounters:  04/24/23 130/82  04/24/23 134/83  01/23/23 132/72  She denies headaches, chest pain, shortness of breath and dizziness.    She was diagnosed with ocular melanoma, left eye in 2014 and has been seeing a specialist at [facility], Dr. Lavella Hammock but now following with Dr. Pearletha Furl, she had radiation and subsequent glaucoma, following with Dr. Honor Loh.  Had cataract surgery in June 2019.   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 Now having CT AB with contrast q 3 months and following closely with [facility].  She had recent grafting of liposection of AB tissue for her left eye. Last CT showed more nodules in lungs and some have grown, follow up has been stable.   She is having laparoscopically supracervical hysterectomy and bilateral salpingectomy. She was having heavy bleeding and cramping, has 3 fibroids.   She had been seeing Dr. [place] every 6 months for skin checks but is off schedule. Still needs to call and schedule an appointment.   BMI is Body mass index is 32.13 kg/m., she is working on diet and exercise.   Wt Readings from Last 3 Encounters:  04/24/23 181 lb 6.4 oz (82.3 kg)  04/24/23 179 lb (81.2 kg)  01/23/23 180  lb 12.8 oz (82 kg)  She does not workout. She denies chest pain, shortness of breath, dizziness.   She is on cholesterol medication, Rosuvastatin 5 mg started after last visit and denies myalgias. Her cholesterol is not at goal. She is no longer vegetarian.  Has been eating some more meat.  The cholesterol last visit was:  Lab Results  Component Value Date   CHOL 203 (H)  01/23/2023   HDL 49 (L) 01/23/2023   LDLCALC 124 (H) 01/23/2023   TRIG 179 (H) 01/23/2023   CHOLHDL 4.1 01/23/2023    Last A1C in the office was:  Lab Results  Component Value Date   HGBA1C 5.5 07/20/2022   Patient is on Vitamin D supplement, 10,000 daily, will miss some.  Lab Results  Component Value Date   VD25OH 25 (L) 07/20/2022   She has a B12 but does not take it regularly, takes sublingual takes 2 a day.  Lab Results  Component Value Date   VITAMINB12 1,421 (H) 06/19/2019   She is on thyroid medication, levothyroxine 112 mcg daily except Wednesday 1 1/2 tab. Her medication was not changed last visit.  She is not on biotin.  Lab Results  Component Value Date   TSH 6.35 (H) 01/23/2023  .  She is not on iron regularlly Lab Results  Component Value Date   IRON 53 06/18/2020   TIBC 389 06/18/2020   FERRITIN 11 (L) 06/18/2020     Current Medications:   Current Outpatient Medications (Endocrine & Metabolic):    SYNTHROID 112 MCG tablet, Take 1 tablet (112 mcg total) by mouth daily on an empty stomach with water at least 30 minutes before a meal.  NO ANTACID MEDS, CALCIUM OR MAGNESIUM FOR 4 HOURS. AVOID BIOTIN.   SYNTHROID 112 MCG tablet, Take 1 tablet (112 mcg total) by mouth daily. Take 1 tab daily and 1 1/2 tab on Wednesdays Please take your thyroid medication greater than 30 min before breakfast, separated by at least 4 hours  from antacids, calcium, iron, and multivitamins.  Current Outpatient Medications (Cardiovascular):    rosuvastatin (CRESTOR) 5 MG tablet, Take 1 tablet (5 mg total) by mouth daily. (Patient taking differently: Take 5 mg by mouth every evening.)  Current Outpatient Medications (Respiratory):    levocetirizine (XYZAL) 5 MG tablet, Take 5 mg by mouth every evening.   Current Outpatient Medications (Hematological):    vitamin B-12 (CYANOCOBALAMIN) 100 MCG tablet, Vitamin B12   Ferrous Sulfate (IRON) 28 MG TABS, Take by mouth daily. (Patient not  taking: Reported on 04/04/2023)  Current Outpatient Medications (Other):    carboxymethylcellulose (REFRESH PLUS) 0.5 % SOLN, Apply to eye.   cholecalciferol (VITAMIN D) 1000 UNITS tablet, Take 2,000 Units by mouth daily.   FLUoxetine (PROZAC) 10 MG capsule, Take 1 capsule (10 mg total) by mouth daily for mood (Patient taking differently: Take 10 mg by mouth every evening.)   MELATONIN PO, Take 1 tablet by mouth as needed.   Polyethyl Glycol-Propyl Glycol (SYSTANE) 0.4-0.3 % SOLN, Apply to eye.   TURMERIC PO, Take by mouth as needed. PRN  Health Maintenance:   Immunization History  Administered Date(s) Administered   Influenza,inj,Quad PF,6+ Mos 05/28/2015   Influenza-Unspecified 07/04/2022   PFIZER(Purple Top)SARS-COV-2 Vaccination 01/17/2020, 02/14/2020   Tdap 08/12/2014   TDAP: 2015  Pneumovax: N/A Prevnar 13: N/A Flu vaccine: 2014 Zostavax: N/A COVID vaccines complete Pap: 11/22/21 MGM: 12/14/21 Colonoscopy: due age 52, considering cologuard EGD:  Medical History:  Past Medical History:  Diagnosis Date   Aortic atherosclerosis (HCC)    Follows w/ PCP, Anda Kraft, NP @ [facility].   Arthritis    hands, feet, knees   Cancer (HCC) 2014   ocular melanoma, peripapillary melanoma of left eye   Heart murmur    no problems   History of eye enucleation 09/2019   left eye, s/p lateral cathal resuspension LLL and periorbital fat grafting / Follows w/ Dr. Merry Lofty Materin @ [facility].   Hyperlipidemia    Follows w/ PCP.   Hypothyroidism    Follows w/ PCP.   PONV (postoperative nausea and vomiting)    past surgery Scop patch and propofol helped   Pulmonary nodules    see 01/26/23 CT scan in Epic   Radiation retinopathy 2014   left eye, s/p PSTK on 09/13/13 with decrease in SRF, s/p Avastin multiple injections and Eylea   Wears contact lenses    Wears glasses    Allergies Allergies  Allergen Reactions   Povidone-Iodine  Swelling    Only in EYE, tolerates on skin   Latanoprost Other (See Comments)    Burns and stings, severe tearing   Other Nausea And Vomiting    Some antibiotics cause nausea. Patient can take if she uses Zofran along with antibiotic.   Oxycodone Nausea And Vomiting    Can take if she uses Zofran and eats a little something.   SURGICAL HISTORY She  has a past surgical history that includes Wisdom tooth extraction; Eye surgery (2014); laparoscopy (N/A, 09/13/2013); Cataract extraction (02/2018); Fine needle aspiration (Left, 10/03/2019); Enucleation (Left, 2021); and Vitrectomy (Left, 2021).   FAMILY HISTORY Her family history includes Arthritis in her mother; Autoimmune disease in her father; Cancer in her maternal grandmother and mother; Cancer (age of onset: 55) in her maternal aunt; Heart disease in her maternal grandfather; Hyperlipidemia in her father; Nephrolithiasis in her father; Varicose Veins in her mother.   SOCIAL HISTORY She  reports that she quit smoking about 29 years ago. Her smoking use included cigarettes. She started smoking about 32 years ago. She has never used smokeless tobacco. She reports current alcohol use of about 1.0 standard drink of alcohol per week. She reports that she does not use drugs.  Review of Systems  Constitutional: Negative.  Negative for chills and fever.  HENT:  Negative for congestion (allergies, on allegra), ear discharge, ear pain, hearing loss, nosebleeds, sinus pain, sore throat and tinnitus.   Eyes:  Negative for blurred vision, double vision, photophobia, pain, discharge and redness.  Respiratory: Negative.  Negative for cough, hemoptysis, sputum production, shortness of breath, wheezing and stridor.   Cardiovascular:  Negative for chest pain, palpitations and leg swelling.  Gastrointestinal: Negative.  Negative for abdominal pain, constipation, diarrhea, heartburn, nausea and vomiting.  Genitourinary: Negative.  Negative for dysuria and  urgency.  Musculoskeletal:  Positive for joint pain (knees bilaterally). Negative for back pain, falls, myalgias and neck pain.  Skin: Negative.  Negative for itching and rash.  Neurological: Negative.  Negative for dizziness, tingling, tremors, weakness and headaches.  Endo/Heme/Allergies: Negative.  Does not bruise/bleed easily.  Psychiatric/Behavioral:  Negative for depression and suicidal ideas. The patient is nervous/anxious. The patient does not have insomnia.     Physical Exam: Estimated body mass index is 32.13 kg/m as calculated from the following:   Height as of this encounter: 5\' 3"  (1.6 m).   Weight as of this encounter: 181 lb 6.4 oz (82.3 kg).  BP 130/82   Pulse 77   Temp (!) 97.5 F (36.4 C)   Ht 5\' 3"  (1.6 m)   Wt 181 lb 6.4 oz (82.3 kg)   LMP 03/29/2023 (Exact Date)   SpO2 96%   BMI 32.13 kg/m  General Appearance: Well nourished, in no apparent distress. Eyes: PERRLA, EOMs, left  ptosis noted-has prosthetic, conjunctiva no swelling or erythema Sinuses: No Frontal/maxillary tenderness ENT/Mouth: Ext aud canals clear, normal light reflex with TMs without erythema, bulging.  Good dentition. No erythema, swelling, or exudate on post pharynx. Tonsils not swollen or erythematous. Hearing normal.  Neck: Supple, thyroid normal. No bruits Respiratory: Respiratory effort normal, BS equal bilaterally without rales, rhonchi, wheezing or stridor. Cardio: RRR without murmurs, rubs or gallops. Brisk peripheral pulses without edema.  Chest: symmetric, with normal excursions and percussion. Breasts: defer Abdomen: Soft, +BS. Non tender, no guarding, rebound, hernias, masses, or organomegaly.  Lymphatics: Non tender without lymphadenopathy.  Genitourinary: defer Musculoskeletal: Full ROM all peripheral extremities,5/5 strength. Mild tenderness with pressure of medial collateral ligament R > L Skin:  Warm, dry without rashes, lesions, ecchymosis.  Neuro: Cranial nerves intact,  reflexes equal bilaterally. Normal muscle tone, no cerebellar symptoms. Sensation intact.  Psych: Awake and oriented X 3, normal affect, Insight and Judgment appropriate.      [facility]  04/24/2023

## 2023-04-24 ENCOUNTER — Encounter (HOSPITAL_COMMUNITY)
Admission: RE | Admit: 2023-04-24 | Discharge: 2023-04-24 | Disposition: A | Discharge status: Home / Self Care | Payer: 59 | Source: Ambulatory Visit | Attending: Obstetrics and Gynecology

## 2023-04-24 ENCOUNTER — Ambulatory Visit (INDEPENDENT_AMBULATORY_CARE_PROVIDER_SITE_OTHER): Payer: 59 | Admitting: Nurse Practitioner

## 2023-04-24 ENCOUNTER — Encounter: Payer: Self-pay | Admitting: Nurse Practitioner

## 2023-04-24 VITALS — BP 130/82 | HR 77 | Temp 97.5°F | Ht 63.0 in | Wt 181.4 lb

## 2023-04-24 DIAGNOSIS — I700 Atherosclerosis of aorta: Secondary | ICD-10-CM

## 2023-04-24 DIAGNOSIS — E785 Hyperlipidemia, unspecified: Secondary | ICD-10-CM | POA: Diagnosis not present

## 2023-04-24 DIAGNOSIS — D251 Intramural leiomyoma of uterus: Secondary | ICD-10-CM | POA: Insufficient documentation

## 2023-04-24 DIAGNOSIS — C6932 Malignant neoplasm of left choroid: Secondary | ICD-10-CM

## 2023-04-24 DIAGNOSIS — Z01812 Encounter for preprocedural laboratory examination: Secondary | ICD-10-CM | POA: Diagnosis not present

## 2023-04-24 DIAGNOSIS — Z6832 Body mass index (BMI) 32.0-32.9, adult: Secondary | ICD-10-CM | POA: Diagnosis not present

## 2023-04-24 DIAGNOSIS — E890 Postprocedural hypothyroidism: Secondary | ICD-10-CM | POA: Diagnosis not present

## 2023-04-24 DIAGNOSIS — Z79899 Other long term (current) drug therapy: Secondary | ICD-10-CM

## 2023-04-24 DIAGNOSIS — D649 Anemia, unspecified: Secondary | ICD-10-CM | POA: Diagnosis not present

## 2023-04-24 DIAGNOSIS — F419 Anxiety disorder, unspecified: Secondary | ICD-10-CM | POA: Diagnosis not present

## 2023-04-24 DIAGNOSIS — E6609 Other obesity due to excess calories: Secondary | ICD-10-CM

## 2023-04-24 DIAGNOSIS — E559 Vitamin D deficiency, unspecified: Secondary | ICD-10-CM | POA: Diagnosis not present

## 2023-04-24 DIAGNOSIS — Z01818 Encounter for other preprocedural examination: Secondary | ICD-10-CM | POA: Diagnosis present

## 2023-04-24 LAB — TYPE AND SCREEN
ABO/RH(D): B POS
Antibody Screen: NEGATIVE

## 2023-04-24 LAB — CBC
HCT: 37.9 % (ref 36.0–46.0)
Hemoglobin: 12.1 g/dL (ref 12.0–15.0)
MCH: 29.1 pg (ref 26.0–34.0)
MCHC: 31.9 g/dL (ref 30.0–36.0)
MCV: 91.1 fL (ref 80.0–100.0)
Platelets: 237 10*3/uL (ref 150–400)
RBC: 4.16 MIL/uL (ref 3.87–5.11)
RDW: 12.3 % (ref 11.5–15.5)
WBC: 6.4 10*3/uL (ref 4.0–10.5)
nRBC: 0 % (ref 0.0–0.2)

## 2023-04-24 NOTE — Patient Instructions (Signed)

## 2023-04-25 NOTE — H&P (Signed)
Tracy Kidd is an 52 y.o. female. She has known myomas, had laparoscopic removal of pedunculated myoma and associated endometrioma in 2014.  She was seen for annual gyn exam, having heavy menses with clots and cramps twice/month.  By exam, 14-16 week size uterus.  Pelvic ultrasound with 9 cm central myoma and 2 smaller myomas.  Medical and surgical options discussed, she wants definitive surgical management.  Pertinent Gynecological History: Last mammogram: normal Date: 01/2023 Last pap: normal Date: 06/2019 OB History: G0  Menstrual History: Patient's last menstrual period was 03/29/2023 (exact date).    Past Medical History:  Diagnosis Date   Aortic atherosclerosis (HCC)    Follows w/ PCP, Anda Kraft, NP @ [facility].   Arthritis    hands, feet, knees   Cancer (HCC) 2014   ocular melanoma, peripapillary melanoma of left eye   Heart murmur    no problems   History of eye enucleation 09/2019   left eye, s/p lateral cathal resuspension LLL and periorbital fat grafting / Follows w/ Dr. Merry Lofty Materin @ [facility].   Hyperlipidemia    Follows w/ PCP.   Hypothyroidism    Follows w/ PCP.   PONV (postoperative nausea and vomiting)    past surgery Scop patch and propofol helped   Pulmonary nodules    see 01/26/23 CT scan in Epic   Radiation retinopathy 2014   left eye, s/p PSTK on 09/13/13 with decrease in SRF, s/p Avastin multiple injections and Eylea   Wears contact lenses    Wears glasses     Past Surgical History:  Procedure Laterality Date   CATARACT EXTRACTION  02/2018   ENUCLEATION Left 2021   left eye, s/p lateral canthal resuspension LLL and periorbital fat grafting   EYE SURGERY  2014   radiation plaque placement and removal   FINE NEEDLE ASPIRATION Left 10/03/2019   left eye   LAPAROSCOPY N/A 09/13/2013   Procedure: LAPAROSCOPY OPERATIVE, removal of pelvic mass;  Surgeon: Oliver Pila, MD;  Location: [facility];   Service: Gynecology;  Laterality: N/A;   VITRECTOMY Left 2021   WISDOM TOOTH EXTRACTION      Family History  Problem Relation Age of Onset   Varicose Veins Mother    Arthritis Mother    Cancer Mother        Melanoma   Nephrolithiasis Father    Hyperlipidemia Father    Autoimmune disease Father        atrophic gastritis/B12 def   Cancer Maternal Grandmother        Lung   Heart disease Maternal Grandfather        CHF   Cancer Maternal Aunt 34       melanoma    Social History:  reports that she quit smoking about 29 years ago. Her smoking use included cigarettes. She started smoking about 32 years ago. She has never used smokeless tobacco. She reports current alcohol use of about 1.0 standard drink of alcohol per week. She reports that she does not use drugs.  Allergies:  Allergies  Allergen Reactions   Povidone-Iodine Swelling    Only in EYE, tolerates on skin   Latanoprost Other (See Comments)    Burns and stings, severe tearing   Other Nausea And Vomiting    Some antibiotics cause nausea. Patient can take if she uses Zofran along with antibiotic.   Oxycodone Nausea And Vomiting    Can take if she uses Zofran and  eats a little something.    No medications prior to admission.    Review of Systems  Respiratory: Negative.    Cardiovascular: Negative.     Height 5\' 3"  (1.6 m), weight 79.8 kg, last menstrual period 03/29/2023. Physical Exam Constitutional:      Appearance: Normal appearance.  Cardiovascular:     Rate and Rhythm: Normal rate and regular rhythm.     Heart sounds: Normal heart sounds. No murmur heard. Pulmonary:     Effort: Pulmonary effort is normal. No respiratory distress.     Breath sounds: Normal breath sounds. No wheezing.  Abdominal:     General: There is no distension.     Palpations: Abdomen is soft. There is no mass.     Tenderness: There is no abdominal tenderness.  Genitourinary:    General: Normal vulva.     Comments: Uterus 14-16  weeks size, tender No adnexal mass Musculoskeletal:     Cervical back: Normal range of motion and neck supple.  Neurological:     Mental Status: She is alert.     No results found for this or any previous visit (from the past 24 hour(s)).  No results found.  Assessment/Plan: Symptomatic myomatous uterus, desires definitive surgical treatment.  Surgical procedure, risks, alternatives, chances of improving her symptoms all discussed, questions answered.  Will admit for [facility], bilateral salpingectomy  Tracy Kidd 04/25/2023, 7:41 PM

## 2023-04-25 NOTE — Anesthesia Preprocedure Evaluation (Signed)
Anesthesia Evaluation  Patient identified by MRN, date of birth, ID band Patient awake    Reviewed: Allergy & Precautions, NPO status , Patient's Chart, lab work & pertinent test results  History of Anesthesia Complications (+) PONV and history of anesthetic complications  Airway Mallampati: I  TM Distance: >3 FB Neck ROM: Full   Comment: Previous grade I view with Miller 2, easy mask Dental  (+) Dental Advisory Given   Pulmonary neg shortness of breath, neg sleep apnea, neg COPD, neg recent URI, former smoker nodules   Pulmonary exam normal breath sounds clear to auscultation       Cardiovascular (-) hypertension(-) angina (-) Past MI, (-) Cardiac Stents and (-) CABG (-) dysrhythmias + Valvular Problems/Murmurs  Rhythm:Regular Rate:Normal - Systolic murmurs HLD   Neuro/Psych neg Seizures PSYCHIATRIC DISORDERS (claustrophobia) Anxiety     negative neurological ROS     GI/Hepatic negative GI ROS, Neg liver ROS,,,  Endo/Other  neg diabetesHypothyroidism    Renal/GU negative Renal ROS     Musculoskeletal  (+) Arthritis ,    Abdominal  (+) + obese  Peds  Hematology  (+) Blood dyscrasia, anemia   Anesthesia Other Findings H/o ocular melanoma s/p radiation, left eye enucleation, psoriasis  Reproductive/Obstetrics                             Anesthesia Physical Anesthesia Plan  ASA: 3  Anesthesia Plan: General   Post-op Pain Management: Tylenol PO (pre-op)*   Induction: Intravenous  PONV Risk Score and Plan: 4 or greater and Ondansetron, Dexamethasone, Midazolam, Scopolamine patch - Pre-op and Treatment may vary due to age or medical condition  Airway Management Planned: Oral ETT  Additional Equipment:   Intra-op Plan:   Post-operative Plan: Extubation in OR  Informed Consent: I have reviewed the patients History and Physical, chart, labs and discussed the procedure including the  risks, benefits and alternatives for the proposed anesthesia with the patient or authorized representative who has indicated his/her understanding and acceptance.     Dental advisory given  Plan Discussed with: CRNA and Anesthesiologist  Anesthesia Plan Comments: (Risks of general anesthesia discussed including, but not limited to, sore throat, hoarse voice, chipped/damaged teeth, injury to vocal cords, nausea and vomiting, allergic reactions, lung infection, heart attack, stroke, and death. All questions answered. )        Anesthesia Quick Evaluation

## 2023-04-26 ENCOUNTER — Observation Stay (HOSPITAL_BASED_OUTPATIENT_CLINIC_OR_DEPARTMENT_OTHER): Payer: 59 | Admitting: Anesthesiology

## 2023-04-26 ENCOUNTER — Encounter (HOSPITAL_BASED_OUTPATIENT_CLINIC_OR_DEPARTMENT_OTHER): Payer: Self-pay | Admitting: Obstetrics and Gynecology

## 2023-04-26 ENCOUNTER — Encounter (HOSPITAL_BASED_OUTPATIENT_CLINIC_OR_DEPARTMENT_OTHER)
Admission: RE | Disposition: A | Discharge status: Home / Self Care | Payer: Self-pay | Source: Home / Self Care | Attending: Obstetrics and Gynecology

## 2023-04-26 ENCOUNTER — Other Ambulatory Visit: Payer: Self-pay

## 2023-04-26 ENCOUNTER — Other Ambulatory Visit (HOSPITAL_COMMUNITY): Payer: Self-pay

## 2023-04-26 ENCOUNTER — Observation Stay (HOSPITAL_BASED_OUTPATIENT_CLINIC_OR_DEPARTMENT_OTHER)
Admission: RE | Admit: 2023-04-26 | Discharge: 2023-04-26 | Disposition: A | Discharge status: Home / Self Care | Payer: 59 | Attending: Obstetrics and Gynecology | Admitting: Obstetrics and Gynecology

## 2023-04-26 DIAGNOSIS — E669 Obesity, unspecified: Secondary | ICD-10-CM | POA: Diagnosis not present

## 2023-04-26 DIAGNOSIS — Z87891 Personal history of nicotine dependence: Secondary | ICD-10-CM | POA: Diagnosis not present

## 2023-04-26 DIAGNOSIS — Z85840 Personal history of malignant neoplasm of eye: Secondary | ICD-10-CM | POA: Diagnosis not present

## 2023-04-26 DIAGNOSIS — Z01818 Encounter for other preprocedural examination: Secondary | ICD-10-CM

## 2023-04-26 DIAGNOSIS — N83201 Unspecified ovarian cyst, right side: Secondary | ICD-10-CM | POA: Diagnosis not present

## 2023-04-26 DIAGNOSIS — D259 Leiomyoma of uterus, unspecified: Principal | ICD-10-CM | POA: Insufficient documentation

## 2023-04-26 DIAGNOSIS — E039 Hypothyroidism, unspecified: Secondary | ICD-10-CM | POA: Insufficient documentation

## 2023-04-26 DIAGNOSIS — E785 Hyperlipidemia, unspecified: Secondary | ICD-10-CM | POA: Diagnosis not present

## 2023-04-26 DIAGNOSIS — Z6831 Body mass index (BMI) 31.0-31.9, adult: Secondary | ICD-10-CM

## 2023-04-26 DIAGNOSIS — D251 Intramural leiomyoma of uterus: Secondary | ICD-10-CM

## 2023-04-26 DIAGNOSIS — Z90710 Acquired absence of both cervix and uterus: Secondary | ICD-10-CM | POA: Diagnosis present

## 2023-04-26 HISTORY — DX: Atherosclerosis of aorta: I70.0

## 2023-04-26 HISTORY — DX: Other nonspecific abnormal finding of lung field: R91.8

## 2023-04-26 HISTORY — PX: LAPAROSCOPIC SUPRACERVICAL HYSTERECTOMY: SHX5399

## 2023-04-26 HISTORY — DX: Cardiac murmur, unspecified: R01.1

## 2023-04-26 HISTORY — DX: Hyperlipidemia, unspecified: E78.5

## 2023-04-26 HISTORY — DX: Presence of spectacles and contact lenses: Z97.3

## 2023-04-26 LAB — POCT PREGNANCY, URINE: Preg Test, Ur: NEGATIVE

## 2023-04-26 SURGERY — HYSTERECTOMY, SUPRACERVICAL, LAPAROSCOPIC
Anesthesia: General | Site: Abdomen | Laterality: Bilateral

## 2023-04-26 MED ORDER — ONDANSETRON HCL 4 MG/2ML IJ SOLN: 4.0000 mg | Freq: Four times a day (QID) | INTRAMUSCULAR | Status: DC | PRN

## 2023-04-26 MED ORDER — MIDAZOLAM HCL 2 MG/2ML IJ SOLN
INTRAMUSCULAR | Status: AC
  Filled 2023-04-26: qty 2

## 2023-04-26 MED ORDER — GABAPENTIN 300 MG PO CAPS
ORAL_CAPSULE | ORAL | Status: AC
  Filled 2023-04-26: qty 1

## 2023-04-26 MED ORDER — ROCURONIUM BROMIDE 10 MG/ML (PF) SYRINGE
PREFILLED_SYRINGE | INTRAVENOUS | Status: DC | PRN
  Administered 2023-04-26: 10 mg via INTRAVENOUS
  Administered 2023-04-26: 60 mg via INTRAVENOUS
  Administered 2023-04-26: 10 mg via INTRAVENOUS

## 2023-04-26 MED ORDER — ACETAMINOPHEN 500 MG PO TABS
1000.0000 mg | ORAL_TABLET | Freq: Four times a day (QID) | ORAL | Status: DC
  Administered 2023-04-26: 1000 mg via ORAL

## 2023-04-26 MED ORDER — BISACODYL 10 MG RE SUPP: 10.0000 mg | Freq: Every day | RECTAL | Status: DC | PRN

## 2023-04-26 MED ORDER — MIDAZOLAM HCL 5 MG/5ML IJ SOLN
INTRAMUSCULAR | Status: DC | PRN
  Administered 2023-04-26: 2 mg via INTRAVENOUS

## 2023-04-26 MED ORDER — LACTATED RINGERS IV SOLN: INTRAVENOUS | Status: DC

## 2023-04-26 MED ORDER — ONDANSETRON HCL 4 MG/2ML IJ SOLN
INTRAMUSCULAR | Status: AC
  Filled 2023-04-26: qty 2

## 2023-04-26 MED ORDER — LEVOTHYROXINE SODIUM 112 MCG PO TABS: 112.0000 ug | ORAL_TABLET | Freq: Every day | ORAL | Status: DC

## 2023-04-26 MED ORDER — GABAPENTIN 300 MG PO CAPS
300.0000 mg | ORAL_CAPSULE | Freq: Three times a day (TID) | ORAL | Status: DC
  Administered 2023-04-26: 300 mg via ORAL

## 2023-04-26 MED ORDER — 0.9 % SODIUM CHLORIDE (POUR BTL) OPTIME
TOPICAL | Status: DC | PRN
  Administered 2023-04-26: 500 mL

## 2023-04-26 MED ORDER — CEFAZOLIN SODIUM-DEXTROSE 2-4 GM/100ML-% IV SOLN
INTRAVENOUS | Status: AC
  Filled 2023-04-26: qty 100

## 2023-04-26 MED ORDER — KETOROLAC TROMETHAMINE 30 MG/ML IJ SOLN: 30.0000 mg | Freq: Four times a day (QID) | INTRAMUSCULAR | Status: DC

## 2023-04-26 MED ORDER — FENTANYL CITRATE (PF) 100 MCG/2ML IJ SOLN
INTRAMUSCULAR | Status: AC
  Filled 2023-04-26: qty 2

## 2023-04-26 MED ORDER — PROPOFOL 10 MG/ML IV BOLUS
INTRAVENOUS | Status: AC
  Filled 2023-04-26: qty 20

## 2023-04-26 MED ORDER — DEXMEDETOMIDINE HCL IN NACL 80 MCG/20ML IV SOLN
INTRAVENOUS | Status: DC | PRN
  Administered 2023-04-26 (×3): 4 ug via INTRAVENOUS

## 2023-04-26 MED ORDER — KETAMINE HCL 50 MG/5ML IJ SOSY
PREFILLED_SYRINGE | INTRAMUSCULAR | Status: AC
  Filled 2023-04-26: qty 5

## 2023-04-26 MED ORDER — PROPOFOL 500 MG/50ML IV EMUL
INTRAVENOUS | Status: DC | PRN
  Administered 2023-04-26: 200 ug/kg/min via INTRAVENOUS

## 2023-04-26 MED ORDER — PROPOFOL 1000 MG/100ML IV EMUL
INTRAVENOUS | Status: AC
  Filled 2023-04-26: qty 100

## 2023-04-26 MED ORDER — LIDOCAINE HCL (PF) 2 % IJ SOLN
INTRAMUSCULAR | Status: AC
  Filled 2023-04-26: qty 5

## 2023-04-26 MED ORDER — OXYCODONE HCL 5 MG PO TABS
5.0000 mg | ORAL_TABLET | ORAL | 0 refills | Status: DC | PRN
  Filled 2023-04-26: qty 15, 3d supply, fill #0

## 2023-04-26 MED ORDER — SIMETHICONE 80 MG PO CHEW: 80.0000 mg | CHEWABLE_TABLET | Freq: Four times a day (QID) | ORAL | Status: DC | PRN

## 2023-04-26 MED ORDER — ACETAMINOPHEN 500 MG PO TABS
ORAL_TABLET | ORAL | Status: AC
  Filled 2023-04-26: qty 2

## 2023-04-26 MED ORDER — SCOPOLAMINE 1 MG/3DAYS TD PT72
1.0000 | MEDICATED_PATCH | TRANSDERMAL | Status: DC
  Administered 2023-04-26: 1.5 mg via TRANSDERMAL

## 2023-04-26 MED ORDER — HEMOSTATIC AGENTS (NO CHARGE) OPTIME
TOPICAL | Status: DC | PRN
  Administered 2023-04-26: 1

## 2023-04-26 MED ORDER — KETOROLAC TROMETHAMINE 30 MG/ML IJ SOLN
INTRAMUSCULAR | Status: AC
  Filled 2023-04-26: qty 1

## 2023-04-26 MED ORDER — FLUOXETINE HCL 10 MG PO CAPS: 10.0000 mg | ORAL_CAPSULE | Freq: Every evening | ORAL | Status: DC

## 2023-04-26 MED ORDER — SODIUM CHLORIDE 0.9 % IR SOLN
Status: DC | PRN
  Administered 2023-04-26: 500 mL

## 2023-04-26 MED ORDER — OXYCODONE HCL 5 MG PO TABS
ORAL_TABLET | ORAL | Status: AC
  Filled 2023-04-26: qty 2

## 2023-04-26 MED ORDER — CEFAZOLIN SODIUM-DEXTROSE 2-4 GM/100ML-% IV SOLN
2.0000 g | INTRAVENOUS | Status: AC
  Administered 2023-04-26: 2 g via INTRAVENOUS

## 2023-04-26 MED ORDER — GABAPENTIN 300 MG PO CAPS
300.0000 mg | ORAL_CAPSULE | ORAL | Status: AC
  Administered 2023-04-26: 300 mg via ORAL

## 2023-04-26 MED ORDER — FENTANYL CITRATE (PF) 100 MCG/2ML IJ SOLN: 25.0000 ug | INTRAMUSCULAR | Status: DC | PRN

## 2023-04-26 MED ORDER — SCOPOLAMINE 1 MG/3DAYS TD PT72
MEDICATED_PATCH | TRANSDERMAL | Status: AC
  Filled 2023-04-26: qty 1

## 2023-04-26 MED ORDER — KETOROLAC TROMETHAMINE 30 MG/ML IJ SOLN
30.0000 mg | Freq: Once | INTRAMUSCULAR | Status: AC
  Administered 2023-04-26: 30 mg via INTRAVENOUS

## 2023-04-26 MED ORDER — ONDANSETRON HCL 4 MG/2ML IJ SOLN
INTRAMUSCULAR | Status: DC | PRN
  Administered 2023-04-26: 4 mg via INTRAVENOUS

## 2023-04-26 MED ORDER — PROPOFOL 10 MG/ML IV BOLUS
INTRAVENOUS | Status: DC | PRN
  Administered 2023-04-26: 200 mg via INTRAVENOUS

## 2023-04-26 MED ORDER — OXYCODONE HCL 5 MG PO TABS
5.0000 mg | ORAL_TABLET | ORAL | Status: DC | PRN
  Administered 2023-04-26 (×2): 10 mg via ORAL

## 2023-04-26 MED ORDER — ALUM & MAG HYDROXIDE-SIMETH 200-200-20 MG/5ML PO SUSP: 30.0000 mL | ORAL | Status: DC | PRN

## 2023-04-26 MED ORDER — ROCURONIUM BROMIDE 10 MG/ML (PF) SYRINGE
PREFILLED_SYRINGE | INTRAVENOUS | Status: AC
  Filled 2023-04-26: qty 10

## 2023-04-26 MED ORDER — IBUPROFEN 200 MG PO TABS: 600.0000 mg | ORAL_TABLET | Freq: Four times a day (QID) | ORAL | Status: DC

## 2023-04-26 MED ORDER — SUGAMMADEX SODIUM 200 MG/2ML IV SOLN
INTRAVENOUS | Status: DC | PRN
  Administered 2023-04-26: 200 mg via INTRAVENOUS

## 2023-04-26 MED ORDER — ROSUVASTATIN CALCIUM 5 MG PO TABS: 5.0000 mg | ORAL_TABLET | Freq: Every evening | ORAL | Status: DC

## 2023-04-26 MED ORDER — LIDOCAINE 2% (20 MG/ML) 5 ML SYRINGE
INTRAMUSCULAR | Status: DC | PRN
  Administered 2023-04-26: 100 mg via INTRAVENOUS

## 2023-04-26 MED ORDER — DEXMEDETOMIDINE HCL IN NACL 80 MCG/20ML IV SOLN
INTRAVENOUS | Status: AC
  Filled 2023-04-26: qty 20

## 2023-04-26 MED ORDER — AMISULPRIDE (ANTIEMETIC) 5 MG/2ML IV SOLN: 10.0000 mg | Freq: Once | INTRAVENOUS | Status: DC | PRN

## 2023-04-26 MED ORDER — DEXAMETHASONE SODIUM PHOSPHATE 10 MG/ML IJ SOLN
INTRAMUSCULAR | Status: AC
  Filled 2023-04-26: qty 1

## 2023-04-26 MED ORDER — FENTANYL CITRATE (PF) 100 MCG/2ML IJ SOLN
INTRAMUSCULAR | Status: DC | PRN
  Administered 2023-04-26: 100 ug via INTRAVENOUS

## 2023-04-26 MED ORDER — MENTHOL 3 MG MT LOZG: 1.0000 | LOZENGE | OROMUCOSAL | Status: DC | PRN

## 2023-04-26 MED ORDER — HYDROMORPHONE HCL 1 MG/ML IJ SOLN: 1.0000 mg | INTRAMUSCULAR | Status: DC | PRN

## 2023-04-26 MED ORDER — ACETAMINOPHEN 500 MG PO TABS
1000.0000 mg | ORAL_TABLET | ORAL | Status: AC
  Administered 2023-04-26: 1000 mg via ORAL

## 2023-04-26 MED ORDER — BUPIVACAINE HCL (PF) 0.25 % IJ SOLN
INTRAMUSCULAR | Status: DC | PRN
  Administered 2023-04-26: 18 mL

## 2023-04-26 MED ORDER — ONDANSETRON HCL 4 MG PO TABS: 4.0000 mg | ORAL_TABLET | Freq: Four times a day (QID) | ORAL | Status: DC | PRN

## 2023-04-26 MED ORDER — DEXTROSE-SODIUM CHLORIDE 5-0.45 % IV SOLN: INTRAVENOUS | Status: DC

## 2023-04-26 MED ORDER — GABAPENTIN 300 MG PO CAPS
300.0000 mg | ORAL_CAPSULE | Freq: Three times a day (TID) | ORAL | 0 refills | Status: DC
  Filled 2023-04-26: qty 7, 3d supply, fill #0

## 2023-04-26 MED ORDER — KETAMINE HCL 10 MG/ML IJ SOLN
INTRAMUSCULAR | Status: DC | PRN
Start: 2023-04-26 — End: 2023-04-26
  Administered 2023-04-26 (×5): 10 mg via INTRAVENOUS

## 2023-04-26 MED ORDER — IBUPROFEN 600 MG PO TABS
600.0000 mg | ORAL_TABLET | Freq: Four times a day (QID) | ORAL | 0 refills | Status: DC
  Filled 2023-04-26: qty 30, 8d supply, fill #0

## 2023-04-26 SURGICAL SUPPLY — 50 items
ADH SKN CLS APL DERMABOND .7 (GAUZE/BANDAGES/DRESSINGS) ×1
APL PRP STRL LF DISP 70% ISPRP (MISCELLANEOUS) ×1
BARRIER ADHS 3X4 INTERCEED (GAUZE/BANDAGES/DRESSINGS) IMPLANT
BLADE SURG 11 STRL SS (BLADE) IMPLANT
BRR ADH 4X3 ABS CNTRL BYND (GAUZE/BANDAGES/DRESSINGS) ×1
CABLE HIGH FREQUENCY MONO STRZ (ELECTRODE) IMPLANT
CELL SAVER LIPIGURD (MISCELLANEOUS) ×1 IMPLANT
CHLORAPREP W/TINT 26 (MISCELLANEOUS) IMPLANT
COVER MAYO STAND STRL (DRAPES) ×1 IMPLANT
DERMABOND ADVANCED .7 DNX12 (GAUZE/BANDAGES/DRESSINGS) ×1 IMPLANT
DEVICE RETRIEVAL ALEXIS 14 (MISCELLANEOUS) IMPLANT
DRAPE SURG IRRIG POUCH 19X23 (DRAPES) ×1 IMPLANT
DRSG OPSITE POSTOP 3X4 (GAUZE/BANDAGES/DRESSINGS) IMPLANT
DURAPREP 26ML APPLICATOR (WOUND CARE) ×1 IMPLANT
EXTRT SYSTEM ALEXIS 14CM (MISCELLANEOUS) ×1
GAUZE 4X4 16PLY ~~LOC~~+RFID DBL (SPONGE) ×2 IMPLANT
GLOVE BIO SURGEON STRL SZ8 (GLOVE) ×1 IMPLANT
GLOVE BIOGEL PI IND STRL 7.0 (GLOVE) ×2 IMPLANT
GLOVE ORTHO TXT STRL SZ7.5 (GLOVE) ×1 IMPLANT
GOWN STRL REUS W/TWL LRG LVL3 (GOWN DISPOSABLE) ×2 IMPLANT
HIBICLENS CHG 4% 4OZ BTL (MISCELLANEOUS) IMPLANT
IRRIG SUCT STRYKERFLOW 2 WTIP (MISCELLANEOUS) ×1
IRRIGATION SUCT STRKRFLW 2 WTP (MISCELLANEOUS) ×1 IMPLANT
IV NS 1000ML (IV SOLUTION) ×1
IV NS 1000ML BAXH (IV SOLUTION) IMPLANT
KIT PINK PAD W/HEAD ARE REST (MISCELLANEOUS) ×1
KIT PINK PAD W/HEAD ARM REST (MISCELLANEOUS) ×1 IMPLANT
KIT TURNOVER CYSTO (KITS) ×1 IMPLANT
NDL SPNL 22GX3.5 QUINCKE BK (NEEDLE) IMPLANT
NEEDLE SPNL 22GX3.5 QUINCKE BK (NEEDLE) IMPLANT
NS IRRIG 1000ML POUR BTL (IV SOLUTION) ×1 IMPLANT
NS IRRIG 500ML POUR BTL (IV SOLUTION) IMPLANT
PACK LAPAROSCOPY BASIN (CUSTOM PROCEDURE TRAY) ×1 IMPLANT
PAD OB MATERNITY 4.3X12.25 (PERSONAL CARE ITEMS) IMPLANT
PROTECTOR NERVE ULNAR (MISCELLANEOUS) ×2 IMPLANT
SET TRI-LUMEN FLTR TB AIRSEAL (TUBING) IMPLANT
SHEARS HARMONIC ACE PLUS 36CM (ENDOMECHANICALS) IMPLANT
SLEEVE SCD COMPRESS KNEE MED (STOCKING) ×1 IMPLANT
SUT VIC AB 3-0 PS2 18 (SUTURE) ×1
SUT VIC AB 3-0 PS2 18XBRD (SUTURE) ×1 IMPLANT
SUT VICRYL 0 UR6 27IN ABS (SUTURE) ×1 IMPLANT
SUT VLOC 180 0 9IN GS21 (SUTURE) IMPLANT
SYR 50ML LL SCALE MARK (SYRINGE) IMPLANT
SYR CONTROL 10ML LL (SYRINGE) IMPLANT
TOWEL OR 17X24 6PK STRL BLUE (TOWEL DISPOSABLE) ×2 IMPLANT
TRAY FOLEY W/BAG SLVR 14FR LF (SET/KITS/TRAYS/PACK) ×1 IMPLANT
TROCAR BALLN 12MMX100 BLUNT (TROCAR) ×1 IMPLANT
TROCAR PORT AIRSEAL 5X120 (TROCAR) IMPLANT
TROCAR Z-THREAD FIOS 5X100MM (TROCAR) ×2 IMPLANT
WARMER LAPAROSCOPE (MISCELLANEOUS) ×1 IMPLANT

## 2023-04-26 NOTE — Progress Notes (Signed)
Post-op check, s/p [facility]  Doing well, mild-mod pain, tol PO, no N/V, voiding, ambulating Afeb, VSS Abd- soft, incisions intact, mild/appropriate tenderness  Meeting postop milestones, d/c home

## 2023-04-26 NOTE — Anesthesia Procedure Notes (Signed)
Procedure Name: Intubation Date/Time: 04/26/2023 7:31 AM  Performed by: Stela Iwasaki D, CRNAPre-anesthesia Checklist: Patient identified, Emergency Drugs available, Suction available and Patient being monitored Patient Re-evaluated:Patient Re-evaluated prior to induction Oxygen Delivery Method: Circle system utilized Preoxygenation: Pre-oxygenation with 100% oxygen Induction Type: IV induction Ventilation: Mask ventilation without difficulty Laryngoscope Size: Mac and 3 Grade View: Grade I Tube type: Oral Tube size: 7.0 mm Number of attempts: 1 Airway Equipment and Method: Stylet and Oral airway Placement Confirmation: ETT inserted through vocal cords under direct vision, positive ETCO2 and breath sounds checked- equal and bilateral Secured at: 20 cm Tube secured with: Tape Dental Injury: Teeth and Oropharynx as per pre-operative assessment

## 2023-04-26 NOTE — Anesthesia Postprocedure Evaluation (Signed)
Anesthesia Post Note  Patient: Tracy Kidd  Procedure(s) Performed: LAPAROSCOPIC SUPRACERVICAL HYSTERECTOMY AND BILATERAL SALPINGECTOMY (Bilateral: Abdomen)     Patient location during evaluation: PACU Anesthesia Type: General Level of consciousness: awake Pain management: pain level controlled Vital Signs Assessment: post-procedure vital signs reviewed and stable Respiratory status: spontaneous breathing, nonlabored ventilation and respiratory function stable Cardiovascular status: blood pressure returned to baseline and stable Postop Assessment: no apparent nausea or vomiting Anesthetic complications: no   No notable events documented.  Last Vitals:  Vitals:   04/26/23 1110 04/26/23 1130  BP:  110/66  Pulse: 71 71  Resp: 19 18  Temp:  36.8 C  SpO2: 97% 93%    Last Pain:  Vitals:   04/26/23 1130  TempSrc: Oral  PainSc: 5                  Linton Rump

## 2023-04-26 NOTE — Interval H&P Note (Signed)
History and Physical Interval Note:  04/26/2023 7:11 AM  Tracy Kidd  has presented today for surgery, with the diagnosis of symptomatic myomas.  The various methods of treatment have been discussed with the patient and family. After consideration of risks, benefits and other options for treatment, the patient has consented to  Procedure(s): LAPAROSCOPIC SUPRACERVICAL HYSTERECTOMY AND BILATERAL SALPINGECTOMY (Bilateral) as a surgical intervention.  The patient's history has been reviewed, patient examined, no change in status, stable for surgery.  I have reviewed the patient's chart and labs.  Questions were answered to the patient's satisfaction.     Leighton Roach Merland Holness

## 2023-04-26 NOTE — Transfer of Care (Signed)
Immediate Anesthesia Transfer of Care Note  Patient: Tracy Kidd  Procedure(s) Performed: LAPAROSCOPIC SUPRACERVICAL HYSTERECTOMY AND BILATERAL SALPINGECTOMY (Bilateral: Abdomen)  Patient Location: PACU  Anesthesia Type:General  Level of Consciousness: awake, alert , and oriented  Airway & Oxygen Therapy: Patient Spontanous Breathing and Patient connected to nasal cannula oxygen  Post-op Assessment: Report given to RN and Post -op Vital signs reviewed and stable  Post vital signs: Reviewed and stable  Last Vitals:  Vitals Value Taken Time  BP 123/72 04/26/23 1030  Temp 37.2 C 04/26/23 1030  Pulse 69 04/26/23 1032  Resp 19 04/26/23 1032  SpO2 100 % 04/26/23 1032  Vitals shown include unfiled device data.  Last Pain:  Vitals:   04/26/23 0611  TempSrc: Oral  PainSc: 0-No pain      Patients Stated Pain Goal: 5 (04/26/23 7829)  Complications: No notable events documented.

## 2023-04-26 NOTE — Discharge Instructions (Addendum)
Routine instructions for laparoscopic hysterectomy 

## 2023-04-26 NOTE — Op Note (Signed)
Preoperative diagnosis: Symptomatic myomatous uterus Postoperative diagnosis: Same Procedure: Laparoscopic supracervical hysterectomy, bilateral salpingectomy Surgeon: Lavina Hamman M.D. Assistant: Ellison Hughs, MD Anesthesia: Gen. Endotracheal tube Findings: She had a normal upper abdomen.  Uterus 14 week size, normal tubes and ovaries except for 3 cm simple cyst right ovary, left ovary adherent to uterus  Specimens: Morcellated uterus for routine pathology Estimated blood loss: 200 cc Complications: None   Procedure in detail: The patient was taken to the operating room and placed in the dorsosupine position. General anesthesia was induced. Arms were tucked to her sides and legs were placed in mobile stirrups. Abdomen perineum and vagina were then prepped and draped in usual sterile fashion and a Foley catheter was inserted. Infraumbilical skin was infiltrated with quarter percent Marcaine and a 3 cm horizontal incision was made.  The fascia was elevated and entered sharply with scissors.  Peritoneum was then entered bluntly.  A pursestring suture of 0 Vicryl was then placed around the fascial incision.  The Hassan cannula with a balloon was then inserted and the abdomen was insufflated.  The laparoscope was inserted and confirmed good placement.  A 5 mm port was then placed on each side under direct visualization. Inspection revealed the above-mentioned findings.  Initially I was unable to see the distal right fallopian tube. Harmonic scalpel was used to remove the tube from the uterus, then take down the right round ligament, utero-ovarian pedicle and broad ligament. The anterior peritoneum was incised across the anterior portion of the uterus to help release the bladder. Uterine artery artery was skeletonized and taken down with the harmonic scalpel Ace with adequate division and adequate hemostasis. I was able to visualize the left tube, so it was grasped, harmonic scalpel used to take down the  mesosalpinx to free the tube, round ligament, utero-ovarian pedicle, and broad ligament. Anterior peritoneum was incised across the anterior portion the uterus to meet the incision coming from the patient's right side. Uterine artery was skeletonized and taken down with the Harmonic Scalpel with adequate division and adequate hemostasis. I then began to remove the uterus from the cervix using a drill, clamp, cut technique on maximum power, using the Harmonic scalpel Ace. This was done about halfway on the left side and then halfway on the right side removing the uterus from the cervix. The cervical stump appeared to be hemostatic.  The umbilical trocar was removed and an Alexis bag was inserted through the incision into the pelvis.  The umbilical trocar was reintroduced and the abdomen was reinsufflated.  I was then able to position the bag in the pelvis and place the uterus in the bag.  The umbilical trocar was removed and the bag was then brought through the incision so that we could morcellate the uterus. The incision guard was placed, and over about 30 minutes I was able to remove the uterus in several pieces in the bag.  The bag was removed.  I discovered that my fascial suture had been cut at some point, so another ) Vicryl pursestring was placed with improved visualization, the umbilical trocar was replaced.   Pelvis was copiously irrigated. . A piece of Interceed was placed over the cervical stump. At this point all pedicles appeared to be hemostatic and there was no other pathology noted.  The 5 mm ports were removed under direct visualization all gas was allowed to deflate from the abdomen.  The umbilical trocar was removed.  The previously placed pursestring suture was tied and this  achieved good fascial closure.  One extra figure 8 of 0 Vicryl was placed for a small fascial defect with good closure.  Skin incisions were closed with interrupted subcuticular sutures of 4-0 Vicryl followed by Dermabond.  Foley catheter removed. The patient tolerated the procedure well. She was taken to the recovery in stable condition. Counts were correct x2, she received Ancef 2 g IV the beginning of the procedure and had PAS hose on throughout the procedure.

## 2023-04-27 ENCOUNTER — Encounter (HOSPITAL_BASED_OUTPATIENT_CLINIC_OR_DEPARTMENT_OTHER): Payer: Self-pay | Admitting: Obstetrics and Gynecology

## 2023-04-28 NOTE — Discharge Summary (Signed)
Physician Discharge Summary  Patient ID: Tracy Kidd MRN: [id number] DOB/AGE: [birth year] 51 y.o.  Admit date: 04/26/2023 Discharge date: 04/26/2023  Admission Diagnoses:  Symptomatic myomatous uterus  Discharge Diagnoses:  Same Principal Problem:   Uterine leiomyoma Active Problems:   S/P laparoscopic hysterectomy   Discharged Condition: good  Hospital Course: Pt admitted, underwent LSH/bilateral salpingectomy without complications.  No postop complications, met milestones by evening of surgery and felt to be stable for discharge  Discharge Exam: Blood pressure 126/74, pulse 88, temperature 98.2 F (36.8 C), resp. rate 18, height 5\' 3"  (1.6 m), weight 81.2 kg, last menstrual period 03/29/2023, SpO2 95%. General appearance: alert  Disposition: Discharge disposition: 01-Home or Self Care       Discharge Instructions     Call MD for:  persistant nausea and vomiting   Complete by: As directed    Call MD for:  severe uncontrolled pain   Complete by: As directed    Call MD for:  temperature >100.4   Complete by: As directed    Diet - low sodium heart healthy   Complete by: As directed    Increase activity slowly   Complete by: As directed    Lifting restrictions   Complete by: As directed    10 lbs      Allergies as of 04/26/2023       Reactions   Povidone-iodine Swelling   Only in EYE, tolerates on skin   Latanoprost Other (See Comments)   Burns and stings, severe tearing   Other Nausea And Vomiting   Some antibiotics cause nausea. Patient can take if she uses Zofran along with antibiotic.   Oxycodone Nausea And Vomiting   Can take if she uses Zofran and eats a little something.        Medication List     TAKE these medications    carboxymethylcellulose 0.5 % Soln Commonly known as: REFRESH PLUS Apply to eye.   cholecalciferol 1000 units tablet Commonly known as: VITAMIN D Take 2,000 Units by mouth daily.   FLUoxetine 10 MG capsule Commonly  known as: PROZAC Take 1 capsule (10 mg total) by mouth daily for mood What changed: when to take this   gabapentin 300 MG capsule Commonly known as: NEURONTIN Take 1 capsule (300 mg total) by mouth 3 (three) times daily for 10 doses.   ibuprofen 600 MG tablet Commonly known as: ADVIL Take 1 tablet (600 mg total) by mouth every 6 (six) hours.   Iron 28 MG Tabs Take by mouth daily.   levocetirizine 5 MG tablet Commonly known as: XYZAL Take 5 mg by mouth every evening.   MELATONIN PO Take 1 tablet by mouth as needed.   oxyCODONE 5 MG immediate release tablet Commonly known as: Oxy IR/ROXICODONE Take 1 tablet (5 mg total) by mouth every 4 (four) hours as needed for severe pain.   rosuvastatin 5 MG tablet Commonly known as: Crestor Take 1 tablet (5 mg total) by mouth daily. What changed: when to take this   Synthroid 112 MCG tablet Generic drug: levothyroxine Take 1 tablet (112 mcg total) by mouth daily on an empty stomach with water at least 30 minutes before a meal.  NO ANTACID MEDS, CALCIUM OR MAGNESIUM FOR 4 HOURS. AVOID BIOTIN.   Systane 0.4-0.3 % Soln Generic drug: Polyethyl Glycol-Propyl Glycol Apply to eye.   TURMERIC PO Take by mouth as needed. PRN   vitamin B-12 100 MCG tablet Commonly known as: CYANOCOBALAMIN Vitamin B12  Follow-up Information     , , MD. Schedule an appointment as soon as possible for a visit in 2 week(s).   Specialty: Obstetrics and Gynecology Contact information: [address] [place] [address] [phone]                 Signed: Leighton Roach  04/28/2023, 2:03 AM

## 2023-05-02 DIAGNOSIS — C439 Malignant melanoma of skin, unspecified: Secondary | ICD-10-CM | POA: Diagnosis not present

## 2023-05-02 DIAGNOSIS — C6932 Malignant neoplasm of left choroid: Secondary | ICD-10-CM | POA: Diagnosis not present

## 2023-05-03 DIAGNOSIS — C6932 Malignant neoplasm of left choroid: Secondary | ICD-10-CM | POA: Diagnosis not present

## 2023-06-14 ENCOUNTER — Other Ambulatory Visit (HOSPITAL_COMMUNITY): Payer: Self-pay

## 2023-06-14 ENCOUNTER — Other Ambulatory Visit: Payer: Self-pay | Admitting: Internal Medicine

## 2023-06-14 DIAGNOSIS — F419 Anxiety disorder, unspecified: Secondary | ICD-10-CM

## 2023-06-14 MED ORDER — FLUOXETINE HCL 10 MG PO CAPS
10.0000 mg | ORAL_CAPSULE | Freq: Every day | ORAL | 0 refills | Status: DC
  Filled 2023-06-14: qty 90, 90d supply, fill #0

## 2023-06-22 ENCOUNTER — Encounter: Payer: 59 | Admitting: Nurse Practitioner

## 2023-07-20 NOTE — Progress Notes (Deleted)
Complete Physical  Assessment and Plan: Tracy Kidd was seen today for annual exam.  Diagnoses and all orders for this visit:  Encounter for general adult medical examination with abnormal findings Due Annually  Aortic atherosclerosis (HCC) Control blood pressure, cholesterol, glucose, increase exercise.  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 Continue to follow at [facility] and [facility] to monitor liver Is looking for new dermatologist for skin checks  Hyperlipidemia, unspecified hyperlipidemia type -     COMPLETE METABOLIC PANEL WITH GFR -     Lipid panel - Continue diet and exercise  Medication management -     CBC with Differential/Platelet -     COMPLETE METABOLIC PANEL WITH GFR -     Magnesium -     Lipid panel -     TSH -     Hemoglobin A1c -     VITAMIN D 25 Hydroxy (Vit-D Deficiency, Fractures) -     Urinalysis, Routine w reflex microscopic -     Microalbumin / creatinine urine ratio   Vitamin D deficiency Continue Vit D supplementation to maintain value in therapeutic level of 60-100  -     VITAMIN D 25 Hydroxy (Vit-D Deficiency, Fractures)  Anemia, unspecified type -     CBC with Differential/Platelet  Anxiety Feels stress is well controlled currently and would like to wean off medication Will take Prozac 10 mg daily x 7 days then every other day x 7 days If symptoms return notify the office  Screening for ischemic heart disease - EKG  Screening for AAA - U/S ABD Retroperitoneal LTD  Screening for hematuria or proteinuria -     Urinalysis, Routine w reflex microscopic -     Microalbumin / creatinine urine ratio  Screening for diabetes mellitus -     Hemoglobin A1c   Obesity BMI 31 Long discussion about weight loss, diet, and exercise Recommended diet heavy in fruits and veggies and low in animal meats,  cheeses, and dairy products, appropriate calorie intake Patient will work on decreasing simple carbs and saturated fats, increase exercise Follow up at next visit  Chronic knee pain - Tylenol and heat as needed Monitor and if persists or worsens will order xray  Discussed med's effects and SE's. Screening labs and tests as requested with regular follow-up as recommended.  HPI Tracy Kidd  presents for a complete physical. has Malignant neoplasm of choroid (HCC); Vitamin D deficiency; Hyperlipidemia; Medication management; Anemia; B12 deficiency; Psoriasis; Corticosteroid-induced glaucoma; Arthropathia; Claustrophobia; Hypothyroidism following radioiodine therapy; Lattice degeneration; Error, refractive, myopia; Aortic atherosclerosis (HCC); H/O enucleation of left eyeball; Uterine leiomyoma; and S/P laparoscopic hysterectomy on their problem list.   Her blood pressure has been controlled at home, today their BP is    BP Readings from Last 3 Encounters:  04/26/23 126/74  04/24/23 134/83  04/24/23 130/82  She denies headaches, chest pain, shortness of breath and dizziness.    She was diagnosed with ocular melanoma, left eye in 2014 and has been seeing a specialist at [facility], Dr. Lavella Hammock but now following with Dr. Pearletha Furl, she had radiation and subsequent glaucoma, following with Dr. Honor Loh.  Had cataract surgery in June 2019.   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 Now having CT AB with contrast q 3 months  and following closely with [facility].  She had recent grafting of liposection of AB tissue for her left eye. She gets METS surveillance yearly and is due for CT of chest and MRI of liver q 3 months- everything has been normal at this point   Had Pap with Dr. Senaida Ores. 10/2021. She continues to have heavy periods with clots and cramping.  Has fibroids and is followed by Dr. Senaida Ores.   She had been  seeing Dr. [place] every 6 months for skin checks but is off schedule.  She would like a new provider   She has been noticing some pain in both knees intermittently.  When pressure applied to medial lower knee she does have pain.  Wore brace at one point and it did help.   She has been taking prozac for anxiety but is interested in weaning off the medication. Stress is now much less.   BMI is There is no height or weight on file to calculate BMI., she is working on diet and exercise.   Wt Readings from Last 3 Encounters:  04/26/23 179 lb (81.2 kg)  04/24/23 179 lb (81.2 kg)  04/24/23 181 lb 6.4 oz (82.3 kg)   She does not workout. She denies chest pain, shortness of breath, dizziness.  She is not on cholesterol medication and denies myalgias. Her cholesterol is not at goal. The cholesterol last visit was:  Lab Results  Component Value Date   CHOL 126 04/24/2023   HDL 47 (L) 04/24/2023   LDLCALC 57 04/24/2023   TRIG 134 04/24/2023   CHOLHDL 2.7 04/24/2023    Last A1C in the office was:  Lab Results  Component Value Date   HGBA1C 5.5 07/20/2022   Patient is on Vitamin D supplement, 10,000 daily, will miss some.  Lab Results  Component Value Date   VD25OH 32 04/24/2023   She has a B12 but does not take it regularly, takes sublingual takes 2 a day.  Lab Results  Component Value Date   VITAMINB12 1,421 (H) 06/19/2019   She is on thyroid medication, levothyroxine 112 mcg daily. Her medication was not changed last visit.  She is not on biotin.  Lab Results  Component Value Date   TSH 0.33 (L) 04/24/2023  .  She is not on iron regularlly Lab Results  Component Value Date   IRON 53 06/18/2020   TIBC 389 06/18/2020   FERRITIN 11 (L) 06/18/2020     Current Medications:   Current Outpatient Medications (Endocrine & Metabolic):    SYNTHROID 112 MCG tablet, Take 1 tablet (112 mcg total) by mouth daily on an empty stomach with water at least 30 minutes before a meal.  NO ANTACID  MEDS, CALCIUM OR MAGNESIUM FOR 4 HOURS. AVOID BIOTIN.  Current Outpatient Medications (Cardiovascular):    rosuvastatin (CRESTOR) 5 MG tablet, Take 1 tablet (5 mg total) by mouth daily. (Patient taking differently: Take 5 mg by mouth every evening.)  Current Outpatient Medications (Respiratory):    levocetirizine (XYZAL) 5 MG tablet, Take 5 mg by mouth every evening.  Current Outpatient Medications (Analgesics):    oxyCODONE (OXY IR/ROXICODONE) 5 MG immediate release tablet, Take 1 tablet (5 mg total) by mouth every 4 (four) hours as needed for severe pain.  Current Outpatient Medications (Hematological):    Ferrous Sulfate (IRON) 28 MG TABS, Take by mouth daily. (Patient not taking: Reported on 04/04/2023)   vitamin B-12 (CYANOCOBALAMIN) 100 MCG tablet, Vitamin B12  Current Outpatient Medications (Other):  carboxymethylcellulose (REFRESH PLUS) 0.5 % SOLN, Apply to eye.   cholecalciferol (VITAMIN D) 1000 UNITS tablet, Take 2,000 Units by mouth daily.   FLUoxetine (PROZAC) 10 MG capsule, Take 1 capsule (10 mg total) by mouth daily for mood   gabapentin (NEURONTIN) 300 MG capsule, Take 1 capsule (300 mg total) by mouth 3 (three) times daily for 10 doses.   MELATONIN PO, Take 1 tablet by mouth as needed.   Polyethyl Glycol-Propyl Glycol (SYSTANE) 0.4-0.3 % SOLN, Apply to eye.   TURMERIC PO, Take by mouth as needed. PRN  Health Maintenance:   Immunization History  Administered Date(s) Administered   Influenza,inj,Quad PF,6+ Mos 05/28/2015   Influenza-Unspecified 07/04/2022   PFIZER(Purple Top)SARS-COV-2 Vaccination 01/17/2020, 02/14/2020   Tdap 08/12/2014   TDAP: 2015  Pneumovax: N/A Prevnar 13: N/A Flu vaccine: 2014 Zostavax: N/A COVID vaccines complete Pap: 11/22/21 MGM: 12/14/21 Colonoscopy: due age 85, considering cologuard EGD:  Medical History:  Past Medical History:  Diagnosis Date   Aortic atherosclerosis (HCC)    Follows w/ PCP, Anda Kraft, NP @ [facility].   Arthritis    hands, feet, knees   Cancer (HCC) 2014   ocular melanoma, peripapillary melanoma of left eye   Heart murmur    no problems   History of eye enucleation 09/2019   left eye, s/p lateral cathal resuspension LLL and periorbital fat grafting / Follows w/ Dr. Merry Lofty Materin @ [facility].   Hyperlipidemia    Follows w/ PCP.   Hypothyroidism    Follows w/ PCP.   PONV (postoperative nausea and vomiting)    past surgery Scop patch and propofol helped   Pulmonary nodules    see 01/26/23 CT scan in Epic   Radiation retinopathy 2014   left eye, s/p PSTK on 09/13/13 with decrease in SRF, s/p Avastin multiple injections and Eylea   Wears contact lenses    Wears glasses    Allergies Allergies  Allergen Reactions   Povidone-Iodine Swelling    Only in EYE, tolerates on skin   Latanoprost Other (See Comments)    Burns and stings, severe tearing   Other Nausea And Vomiting    Some antibiotics cause nausea. Patient can take if she uses Zofran along with antibiotic.   Oxycodone Nausea And Vomiting    Can take if she uses Zofran and eats a little something.   SURGICAL HISTORY She  has a past surgical history that includes Wisdom tooth extraction; Eye surgery (2014); laparoscopy (N/A, 09/13/2013); Cataract extraction (02/2018); Fine needle aspiration (Left, 10/03/2019); Enucleation (Left, 2021); Vitrectomy (Left, 2021); and Laparoscopic supracervical hysterectomy (Bilateral, 04/26/2023).   FAMILY HISTORY Her family history includes Arthritis in her mother; Autoimmune disease in her father; Cancer in her maternal grandmother and mother; Cancer (age of onset: 61) in her maternal aunt; Heart disease in her maternal grandfather; Hyperlipidemia in her father; Nephrolithiasis in her father; Varicose Veins in her mother.   SOCIAL HISTORY She  reports that she quit smoking about 29 years ago. Her smoking use included cigarettes. She started smoking  about 32 years ago. She has never used smokeless tobacco. She reports current alcohol use of about 1.0 standard drink of alcohol per week. She reports that she does not use drugs.  Review of Systems  Constitutional: Negative.  Negative for chills and fever.  HENT:  Negative for congestion (allergies, on allegra), ear discharge, ear pain, hearing loss, nosebleeds, sinus pain, sore throat and tinnitus.   Eyes:  Negative for blurred vision, double vision, photophobia, pain, discharge and redness.  Respiratory: Negative.  Negative for cough, hemoptysis, sputum production, shortness of breath, wheezing and stridor.   Cardiovascular:  Negative for chest pain, palpitations and leg swelling.  Gastrointestinal: Negative.  Negative for abdominal pain, constipation, diarrhea, heartburn, nausea and vomiting.  Genitourinary: Negative.  Negative for dysuria and urgency.  Musculoskeletal:  Positive for joint pain (knees bilaterally). Negative for back pain, falls, myalgias and neck pain.  Skin: Negative.  Negative for itching and rash.  Neurological: Negative.  Negative for dizziness, tingling, tremors, weakness and headaches.  Endo/Heme/Allergies: Negative.  Does not bruise/bleed easily.  Psychiatric/Behavioral: Negative.  Negative for depression and suicidal ideas. The patient is not nervous/anxious and does not have insomnia.     Physical Exam: Estimated body mass index is 31.71 kg/m as calculated from the following:   Height as of 04/26/23: 5\' 3"  (1.6 m).   Weight as of 04/26/23: 179 lb (81.2 kg). There were no vitals taken for this visit. General Appearance: Well nourished, in no apparent distress. Eyes: PERRLA, EOMs, left  ptosis noted-has prosthetic, conjunctiva no swelling or erythema Sinuses: No Frontal/maxillary tenderness ENT/Mouth: Ext aud canals clear, normal light reflex with TMs without erythema, bulging.  Good dentition. No erythema, swelling, or exudate on post pharynx. Tonsils not swollen  or erythematous. Hearing normal.  Neck: Supple, thyroid normal. No bruits Respiratory: Respiratory effort normal, BS equal bilaterally without rales, rhonchi, wheezing or stridor. Cardio: RRR without murmurs, rubs or gallops. Brisk peripheral pulses without edema.  Chest: symmetric, with normal excursions and percussion. Breasts: defer Abdomen: Soft, +BS. Non tender, no guarding, rebound, hernias, masses, or organomegaly.  Lymphatics: Non tender without lymphadenopathy.  Genitourinary: defer Musculoskeletal: Full ROM all peripheral extremities,5/5 strength. Mild tenderness with pressure of medial collateral ligament R > L Skin:  Warm, dry without rashes, lesions, ecchymosis.  Neuro: Cranial nerves intact, reflexes equal bilaterally. Normal muscle tone, no cerebellar symptoms. Sensation intact.  Psych: Awake and oriented X 3, normal affect, Insight and Judgment appropriate.   EKG: deferred, had baseline last year    Tonique Mendonca E  12:13 PM

## 2023-07-24 ENCOUNTER — Encounter: Payer: 59 | Admitting: Nurse Practitioner

## 2023-07-24 DIAGNOSIS — Z1389 Encounter for screening for other disorder: Secondary | ICD-10-CM

## 2023-07-24 DIAGNOSIS — F419 Anxiety disorder, unspecified: Secondary | ICD-10-CM

## 2023-07-24 DIAGNOSIS — E66811 Obesity, class 1: Secondary | ICD-10-CM

## 2023-07-24 DIAGNOSIS — G8929 Other chronic pain: Secondary | ICD-10-CM

## 2023-07-24 DIAGNOSIS — Z131 Encounter for screening for diabetes mellitus: Secondary | ICD-10-CM

## 2023-07-24 DIAGNOSIS — E559 Vitamin D deficiency, unspecified: Secondary | ICD-10-CM

## 2023-07-24 DIAGNOSIS — D649 Anemia, unspecified: Secondary | ICD-10-CM

## 2023-07-24 DIAGNOSIS — Z0001 Encounter for general adult medical examination with abnormal findings: Secondary | ICD-10-CM

## 2023-07-24 DIAGNOSIS — E785 Hyperlipidemia, unspecified: Secondary | ICD-10-CM

## 2023-07-24 DIAGNOSIS — Z79899 Other long term (current) drug therapy: Secondary | ICD-10-CM

## 2023-07-24 DIAGNOSIS — C6932 Malignant neoplasm of left choroid: Secondary | ICD-10-CM

## 2023-07-24 DIAGNOSIS — E890 Postprocedural hypothyroidism: Secondary | ICD-10-CM

## 2023-07-24 DIAGNOSIS — I700 Atherosclerosis of aorta: Secondary | ICD-10-CM

## 2023-07-24 DIAGNOSIS — Z136 Encounter for screening for cardiovascular disorders: Secondary | ICD-10-CM

## 2023-07-26 ENCOUNTER — Telehealth: Payer: 59 | Admitting: Nurse Practitioner

## 2023-07-26 ENCOUNTER — Other Ambulatory Visit (HOSPITAL_COMMUNITY): Payer: Self-pay

## 2023-07-26 ENCOUNTER — Telehealth: Payer: 59 | Admitting: Physician Assistant

## 2023-07-26 DIAGNOSIS — B9789 Other viral agents as the cause of diseases classified elsewhere: Secondary | ICD-10-CM

## 2023-07-26 DIAGNOSIS — J019 Acute sinusitis, unspecified: Secondary | ICD-10-CM | POA: Diagnosis not present

## 2023-07-26 MED ORDER — PREDNISONE 20 MG PO TABS
40.0000 mg | ORAL_TABLET | Freq: Every day | ORAL | 0 refills | Status: DC
  Filled 2023-07-26: qty 10, 5d supply, fill #0

## 2023-07-26 MED ORDER — IPRATROPIUM BROMIDE 0.03 % NA SOLN
2.0000 | Freq: Two times a day (BID) | NASAL | 0 refills | Status: DC
  Filled 2023-07-26: qty 30, 30d supply, fill #0

## 2023-07-26 NOTE — Progress Notes (Signed)
Kaybri,  It is normal to have a fever for the first few days of a virus. I agree with the plan from earlier today.  I would advise cold/flu medication as support in addition to the prednisone that was ordered along with immune support (rest/increased fluids and vitamin C).  We do not typically recommend antibiotics prior too 7-10 days of sinus congestion.  Providers prescribe antibiotics to treat infections caused by bacteria. Antibiotics are very powerful in treating bacterial infections when they are used properly. To maintain their effectiveness, they should be used only when necessary. Overuse of antibiotics has resulted in the development of superbugs that are resistant to treatment!    After careful review of your answers, I would not recommend an antibiotic for your condition.  Antibiotics are not effective against viruses and therefore should not be used to treat them. Common examples of infections caused by viruses include colds and flu   Thank you Viviano Simas   Duplicate visit no charge

## 2023-07-26 NOTE — Progress Notes (Signed)
I have spent 5 minutes in review of e-visit questionnaire, review and updating patient chart, medical decision making and response to patient.   Mia Milan Cody Jacklynn Dehaas, PA-C    

## 2023-07-26 NOTE — Progress Notes (Signed)
E-Visit for Sinus Problems  We are sorry that you are not feeling well.  Here is how we plan to help!  Based on what you have shared with me it looks like you have sinusitis.  Sinusitis is inflammation and infection in the sinus cavities of the head.  Based on your presentation I believe you most likely have Acute Viral Sinusitis.This is an infection most likely caused by a virus. There is not specific treatment for viral sinusitis other than to help you with the symptoms until the infection runs its course.  You may use an oral decongestant such as Mucinex D or if you have glaucoma or high blood pressure use plain Mucinex. Saline nasal spray help and can safely be used as often as needed for congestion, I have prescribed: Ipratropium Bromide nasal spray 0.03% 2 sprays in eah nostril 2-3 times a day  Some authorities believe that zinc sprays or the use of Echinacea may shorten the course of your symptoms.  I have also sent in a short course of prednisone to take as directed.   Sinus infections are not as easily transmitted as other respiratory infection, however we still recommend that you avoid close contact with loved ones, especially the very young and elderly.  Remember to wash your hands thoroughly throughout the day as this is the number one way to prevent the spread of infection!  Home Care: Only take medications as instructed by your medical team. Do not take these medications with alcohol. A steam or ultrasonic humidifier can help congestion.  You can place a towel over your head and breathe in the steam from hot water coming from a faucet. Avoid close contacts especially the very young and the elderly. Cover your mouth when you cough or sneeze. Always remember to wash your hands.  Get Help Right Away If: You develop worsening fever or sinus pain. You develop a severe head ache or visual changes. Your symptoms persist after you have completed your treatment plan.  Make sure  you Understand these instructions. Will watch your condition. Will get help right away if you are not doing well or get worse.   Thank you for choosing an e-visit.  Your e-visit answers were reviewed by a board certified advanced clinical practitioner to complete your personal care plan. Depending upon the condition, your plan could have included both over the counter or prescription medications.  Please review your pharmacy choice. Make sure the pharmacy is open so you can pick up prescription now. If there is a problem, you may contact your provider through [employer] and have the prescription routed to another pharmacy.  Your safety is important to [place]. If you have drug allergies check your prescription carefully.   For the next 24 hours you can use MyChart to ask questions about today's visit, request a non-urgent call back, or ask for a work or school excuse. You will get an email in the next two days asking about your experience. I hope that your e-visit has been valuable and will speed your recovery.

## 2023-08-03 DIAGNOSIS — C6932 Malignant neoplasm of left choroid: Secondary | ICD-10-CM | POA: Diagnosis not present

## 2023-08-03 DIAGNOSIS — C439 Malignant melanoma of skin, unspecified: Secondary | ICD-10-CM | POA: Diagnosis not present

## 2023-08-03 DIAGNOSIS — R911 Solitary pulmonary nodule: Secondary | ICD-10-CM | POA: Diagnosis not present

## 2023-08-07 DIAGNOSIS — C6932 Malignant neoplasm of left choroid: Secondary | ICD-10-CM | POA: Diagnosis not present

## 2023-08-16 NOTE — Progress Notes (Unsigned)
Complete Physical  Assessment and Plan: Chaneta was seen today for annual exam.  Diagnoses and all orders for this visit:  Encounter for general adult medical examination with abnormal findings Due Annually  Aortic atherosclerosis (HCC) Control blood pressure, cholesterol, glucose, increase exercise.  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 Continue to follow at [facility] and [facility] to monitor liver Is looking for new dermatologist for skin checks  Hyperlipidemia, unspecified hyperlipidemia type -     COMPLETE METABOLIC PANEL WITH GFR -     Lipid panel - Continue diet and exercise  Medication management -     CBC with Differential/Platelet -     COMPLETE METABOLIC PANEL WITH GFR -     Magnesium -     Lipid panel -     TSH -     Hemoglobin A1c -     VITAMIN D 25 Hydroxy (Vit-D Deficiency, Fractures) -     Urinalysis, Routine w reflex microscopic -     Microalbumin / creatinine urine ratio   Vitamin D deficiency Continue Vit D supplementation to maintain value in therapeutic level of 60-100  -     VITAMIN D 25 Hydroxy (Vit-D Deficiency, Fractures)  Anemia, unspecified type -     CBC with Differential/Platelet  Anxiety Feels stress is well controlled currently and would like to wean off medication Will take Prozac 10 mg daily x 7 days then every other day x 7 days If symptoms return notify the office  Screening for ischemic heart disease - EKG  Screening for AAA - U/S ABD Retroperitoneal LTD  Screening for hematuria or proteinuria -     Urinalysis, Routine w reflex microscopic -     Microalbumin / creatinine urine ratio  Screening for diabetes mellitus -     Hemoglobin A1c   Obesity BMI 31 Long discussion about weight loss, diet, and exercise Recommended diet heavy in fruits and veggies and low in animal meats,  cheeses, and dairy products, appropriate calorie intake Patient will work on decreasing simple carbs and saturated fats, increase exercise Follow up at next visit  Chronic knee pain - Tylenol and heat as needed Monitor and if persists or worsens will order xray  Discussed med's effects and SE's. Screening labs and tests as requested with regular follow-up as recommended.  HPI 52 y.o. female  presents for a complete physical. has Malignant neoplasm of choroid (HCC); Vitamin D deficiency; Hyperlipidemia; Medication management; Anemia; B12 deficiency; Psoriasis; Corticosteroid-induced glaucoma; Arthropathia; Claustrophobia; Hypothyroidism following radioiodine therapy; Lattice degeneration; Error, refractive, myopia; Aortic atherosclerosis (HCC); H/O enucleation of left eyeball; Uterine leiomyoma; and S/P laparoscopic hysterectomy on their problem list.   Her blood pressure has been controlled at home, today their BP is    BP Readings from Last 3 Encounters:  04/26/23 126/74  04/24/23 134/83  04/24/23 130/82  She denies headaches, chest pain, shortness of breath and dizziness.    She was diagnosed with ocular melanoma, left eye in 2014 and has been seeing a specialist at [facility], Dr. Lavella Hammock but now following with Dr. Pearletha Furl, she had radiation and subsequent glaucoma, following with Dr. Honor Loh.  Had cataract surgery in June 2019.   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 Now having CT AB with contrast q 3 months  and following closely with [facility].  She had recent grafting of liposection of AB tissue for her left eye. She gets METS surveillance yearly and is due for CT of chest and MRI of liver q 3 months- everything has been normal at this point   Had Pap with Dr. Senaida Ores. 10/2021. She continues to have heavy periods with clots and cramping.  Has fibroids and is followed by Dr. Senaida Ores.   She had been  seeing Dr. [place] every 6 months for skin checks but is off schedule.  She would like a new provider   She has been noticing some pain in both knees intermittently.  When pressure applied to medial lower knee she does have pain.  Wore brace at one point and it did help.   She has been taking prozac for anxiety but is interested in weaning off the medication. Stress is now much less.   BMI is There is no height or weight on file to calculate BMI., she is working on diet and exercise.   Wt Readings from Last 3 Encounters:  04/26/23 179 lb (81.2 kg)  04/24/23 179 lb (81.2 kg)  04/24/23 181 lb 6.4 oz (82.3 kg)   She does not workout. She denies chest pain, shortness of breath, dizziness.  She is not on cholesterol medication and denies myalgias. Her cholesterol is not at goal. The cholesterol last visit was:  Lab Results  Component Value Date   CHOL 126 04/24/2023   HDL 47 (L) 04/24/2023   LDLCALC 57 04/24/2023   TRIG 134 04/24/2023   CHOLHDL 2.7 04/24/2023    Last A1C in the office was:  Lab Results  Component Value Date   HGBA1C 5.5 07/20/2022   Patient is on Vitamin D supplement, 10,000 daily, will miss some.  Lab Results  Component Value Date   VD25OH 32 04/24/2023   She has a B12 but does not take it regularly, takes sublingual takes 2 a day.  Lab Results  Component Value Date   VITAMINB12 1,421 (H) 06/19/2019   She is on thyroid medication, levothyroxine 112 mcg daily. Her medication was not changed last visit.  She is not on biotin.  Lab Results  Component Value Date   TSH 0.33 (L) 04/24/2023  .  She is not on iron regularlly Lab Results  Component Value Date   IRON 53 06/18/2020   TIBC 389 06/18/2020   FERRITIN 11 (L) 06/18/2020     Current Medications:   Current Outpatient Medications (Endocrine & Metabolic):    predniSONE (DELTASONE) 20 MG tablet, Take 2 tablets (40 mg total) by mouth daily with breakfast.   SYNTHROID 112 MCG tablet, Take 1 tablet (112 mcg  total) by mouth daily on an empty stomach with water at least 30 minutes before a meal.  NO ANTACID MEDS, CALCIUM OR MAGNESIUM FOR 4 HOURS. AVOID BIOTIN.  Current Outpatient Medications (Cardiovascular):    rosuvastatin (CRESTOR) 5 MG tablet, Take 1 tablet (5 mg total) by mouth daily. (Patient taking differently: Take 5 mg by mouth every evening.)  Current Outpatient Medications (Respiratory):    ipratropium (ATROVENT) 0.03 % nasal spray, Place 2 sprays into both nostrils every 12 (twelve) hours.   levocetirizine (XYZAL) 5 MG tablet, Take 5 mg by mouth every evening.  Current Outpatient Medications (Analgesics):    oxyCODONE (OXY IR/ROXICODONE) 5 MG immediate release tablet, Take 1 tablet (5 mg total) by mouth every 4 (four) hours as needed for severe pain.  Current Outpatient Medications (Hematological):  Ferrous Sulfate (IRON) 28 MG TABS, Take by mouth daily. (Patient not taking: Reported on 04/04/2023)   vitamin B-12 (CYANOCOBALAMIN) 100 MCG tablet, Vitamin B12  Current Outpatient Medications (Other):    carboxymethylcellulose (REFRESH PLUS) 0.5 % SOLN, Apply to eye.   cholecalciferol (VITAMIN D) 1000 UNITS tablet, Take 2,000 Units by mouth daily.   FLUoxetine (PROZAC) 10 MG capsule, Take 1 capsule (10 mg total) by mouth daily for mood   gabapentin (NEURONTIN) 300 MG capsule, Take 1 capsule (300 mg total) by mouth 3 (three) times daily for 10 doses.   MELATONIN PO, Take 1 tablet by mouth as needed.   Polyethyl Glycol-Propyl Glycol (SYSTANE) 0.4-0.3 % SOLN, Apply to eye.   TURMERIC PO, Take by mouth as needed. PRN  Health Maintenance:   Immunization History  Administered Date(s) Administered   Influenza,inj,Quad PF,6+ Mos 05/28/2015   Influenza-Unspecified 07/04/2022   PFIZER(Purple Top)SARS-COV-2 Vaccination 01/17/2020, 02/14/2020   Tdap 08/12/2014   TDAP: 2015  Pneumovax: N/A Prevnar 13: N/A Flu vaccine: 2014 Zostavax: N/A COVID vaccines complete Pap: 11/22/21 MGM:  12/14/21 Colonoscopy: due age 19, considering cologuard EGD:  Medical History:  Past Medical History:  Diagnosis Date   Aortic atherosclerosis (HCC)    Follows w/ PCP, Anda Kraft, NP @ [facility].   Arthritis    hands, feet, knees   Cancer (HCC) 2014   ocular melanoma, peripapillary melanoma of left eye   Heart murmur    no problems   History of eye enucleation 09/2019   left eye, s/p lateral cathal resuspension LLL and periorbital fat grafting / Follows w/ Dr. Merry Lofty Materin @ [facility].   Hyperlipidemia    Follows w/ PCP.   Hypothyroidism    Follows w/ PCP.   PONV (postoperative nausea and vomiting)    past surgery Scop patch and propofol helped   Pulmonary nodules    see 01/26/23 CT scan in Epic   Radiation retinopathy 2014   left eye, s/p PSTK on 09/13/13 with decrease in SRF, s/p Avastin multiple injections and Eylea   Wears contact lenses    Wears glasses    Allergies Allergies  Allergen Reactions   Povidone-Iodine Swelling    Only in EYE, tolerates on skin   Latanoprost Other (See Comments)    Burns and stings, severe tearing   Other Nausea And Vomiting    Some antibiotics cause nausea. Patient can take if she uses Zofran along with antibiotic.   Oxycodone Nausea And Vomiting    Can take if she uses Zofran and eats a little something.   SURGICAL HISTORY She  has a past surgical history that includes Wisdom tooth extraction; Eye surgery (2014); laparoscopy (N/A, 09/13/2013); Cataract extraction (02/2018); Fine needle aspiration (Left, 10/03/2019); Enucleation (Left, 2021); Vitrectomy (Left, 2021); and Laparoscopic supracervical hysterectomy (Bilateral, 04/26/2023).   FAMILY HISTORY Her family history includes Arthritis in her mother; Autoimmune disease in her father; Cancer in her maternal grandmother and mother; Cancer (age of onset: 47) in her maternal aunt; Heart disease in her maternal grandfather; Hyperlipidemia  in her father; Nephrolithiasis in her father; Varicose Veins in her mother.   SOCIAL HISTORY She  reports that she quit smoking about 29 years ago. Her smoking use included cigarettes. She started smoking about 32 years ago. She has never used smokeless tobacco. She reports current alcohol use of about 1.0 standard drink of alcohol per week. She reports that she does not use drugs.  Review of Systems  Constitutional: Negative.  Negative for chills and fever.  HENT:  Negative for congestion (allergies, on allegra), ear discharge, ear pain, hearing loss, nosebleeds, sinus pain, sore throat and tinnitus.   Eyes:  Negative for blurred vision, double vision, photophobia, pain, discharge and redness.  Respiratory: Negative.  Negative for cough, hemoptysis, sputum production, shortness of breath, wheezing and stridor.   Cardiovascular:  Negative for chest pain, palpitations and leg swelling.  Gastrointestinal: Negative.  Negative for abdominal pain, constipation, diarrhea, heartburn, nausea and vomiting.  Genitourinary: Negative.  Negative for dysuria and urgency.  Musculoskeletal:  Positive for joint pain (knees bilaterally). Negative for back pain, falls, myalgias and neck pain.  Skin: Negative.  Negative for itching and rash.  Neurological: Negative.  Negative for dizziness, tingling, tremors, weakness and headaches.  Endo/Heme/Allergies: Negative.  Does not bruise/bleed easily.  Psychiatric/Behavioral: Negative.  Negative for depression and suicidal ideas. The patient is not nervous/anxious and does not have insomnia.     Physical Exam: Estimated body mass index is 31.71 kg/m as calculated from the following:   Height as of 04/26/23: 5\' 3"  (1.6 m).   Weight as of 04/26/23: 179 lb (81.2 kg). There were no vitals taken for this visit. General Appearance: Well nourished, in no apparent distress. Eyes: PERRLA, EOMs, left  ptosis noted-has prosthetic, conjunctiva no swelling or erythema Sinuses: No  Frontal/maxillary tenderness ENT/Mouth: Ext aud canals clear, normal light reflex with TMs without erythema, bulging.  Good dentition. No erythema, swelling, or exudate on post pharynx. Tonsils not swollen or erythematous. Hearing normal.  Neck: Supple, thyroid normal. No bruits Respiratory: Respiratory effort normal, BS equal bilaterally without rales, rhonchi, wheezing or stridor. Cardio: RRR without murmurs, rubs or gallops. Brisk peripheral pulses without edema.  Chest: symmetric, with normal excursions and percussion. Breasts: defer Abdomen: Soft, +BS. Non tender, no guarding, rebound, hernias, masses, or organomegaly.  Lymphatics: Non tender without lymphadenopathy.  Genitourinary: defer Musculoskeletal: Full ROM all peripheral extremities,5/5 strength. Mild tenderness with pressure of medial collateral ligament R > L Skin:  Warm, dry without rashes, lesions, ecchymosis.  Neuro: Cranial nerves intact, reflexes equal bilaterally. Normal muscle tone, no cerebellar symptoms. Sensation intact.  Psych: Awake and oriented X 3, normal affect, Insight and Judgment appropriate.   EKG: deferred, had baseline last year    Indy Prestwood E  12:22 PM

## 2023-08-17 ENCOUNTER — Ambulatory Visit (INDEPENDENT_AMBULATORY_CARE_PROVIDER_SITE_OTHER): Payer: 59 | Admitting: Nurse Practitioner

## 2023-08-17 ENCOUNTER — Encounter: Payer: Self-pay | Admitting: Nurse Practitioner

## 2023-08-17 VITALS — BP 138/84 | HR 82 | Temp 97.5°F | Ht 63.0 in | Wt 189.4 lb

## 2023-08-17 DIAGNOSIS — Z79899 Other long term (current) drug therapy: Secondary | ICD-10-CM | POA: Diagnosis not present

## 2023-08-17 DIAGNOSIS — I10 Essential (primary) hypertension: Secondary | ICD-10-CM | POA: Diagnosis not present

## 2023-08-17 DIAGNOSIS — Z0000 Encounter for general adult medical examination without abnormal findings: Secondary | ICD-10-CM | POA: Diagnosis not present

## 2023-08-17 DIAGNOSIS — C6932 Malignant neoplasm of left choroid: Secondary | ICD-10-CM

## 2023-08-17 DIAGNOSIS — Z131 Encounter for screening for diabetes mellitus: Secondary | ICD-10-CM | POA: Diagnosis not present

## 2023-08-17 DIAGNOSIS — E890 Postprocedural hypothyroidism: Secondary | ICD-10-CM

## 2023-08-17 DIAGNOSIS — Z1389 Encounter for screening for other disorder: Secondary | ICD-10-CM | POA: Diagnosis not present

## 2023-08-17 DIAGNOSIS — E785 Hyperlipidemia, unspecified: Secondary | ICD-10-CM

## 2023-08-17 DIAGNOSIS — E559 Vitamin D deficiency, unspecified: Secondary | ICD-10-CM | POA: Diagnosis not present

## 2023-08-17 DIAGNOSIS — E6609 Other obesity due to excess calories: Secondary | ICD-10-CM

## 2023-08-17 DIAGNOSIS — Z0001 Encounter for general adult medical examination with abnormal findings: Secondary | ICD-10-CM

## 2023-08-17 DIAGNOSIS — D649 Anemia, unspecified: Secondary | ICD-10-CM | POA: Diagnosis not present

## 2023-08-17 DIAGNOSIS — E538 Deficiency of other specified B group vitamins: Secondary | ICD-10-CM

## 2023-08-17 DIAGNOSIS — Z136 Encounter for screening for cardiovascular disorders: Secondary | ICD-10-CM | POA: Diagnosis not present

## 2023-08-17 DIAGNOSIS — E66811 Obesity, class 1: Secondary | ICD-10-CM

## 2023-08-17 DIAGNOSIS — I700 Atherosclerosis of aorta: Secondary | ICD-10-CM

## 2023-08-17 DIAGNOSIS — F419 Anxiety disorder, unspecified: Secondary | ICD-10-CM

## 2023-08-17 DIAGNOSIS — G8929 Other chronic pain: Secondary | ICD-10-CM

## 2023-08-17 NOTE — Patient Instructions (Signed)

## 2023-08-18 LAB — COMPLETE METABOLIC PANEL WITH GFR
AG Ratio: 2 (calc) (ref 1.0–2.5)
ALT: 11 U/L (ref 6–29)
AST: 11 U/L (ref 10–35)
Albumin: 4.3 g/dL (ref 3.6–5.1)
Alkaline phosphatase (APISO): 83 U/L (ref 37–153)
BUN: 21 mg/dL (ref 7–25)
CO2: 27 mmol/L (ref 20–32)
Calcium: 8.8 mg/dL (ref 8.6–10.4)
Chloride: 108 mmol/L (ref 98–110)
Creat: 0.93 mg/dL (ref 0.50–1.03)
Globulin: 2.2 g/dL (ref 1.9–3.7)
Glucose, Bld: 78 mg/dL (ref 65–99)
Potassium: 4.3 mmol/L (ref 3.5–5.3)
Sodium: 141 mmol/L (ref 135–146)
Total Bilirubin: 0.3 mg/dL (ref 0.2–1.2)
Total Protein: 6.5 g/dL (ref 6.1–8.1)
eGFR: 75 mL/min/{1.73_m2} (ref >=60)

## 2023-08-18 LAB — CBC WITH DIFFERENTIAL/PLATELET
Absolute Lymphocytes: 1599 {cells}/uL (ref 850–3900)
Absolute Monocytes: 371 {cells}/uL (ref 200–950)
Basophils Absolute: 33 {cells}/uL (ref 0–200)
Basophils Relative: 0.5 %
Eosinophils Absolute: 98 {cells}/uL (ref 15–500)
Eosinophils Relative: 1.5 %
HCT: 35.7 % (ref 35.0–45.0)
Hemoglobin: 11.7 g/dL (ref 11.7–15.5)
MCH: 28.5 pg (ref 27.0–33.0)
MCHC: 32.8 g/dL (ref 32.0–36.0)
MCV: 86.9 fL (ref 80.0–100.0)
MPV: 10.1 fL (ref 7.5–12.5)
Monocytes Relative: 5.7 %
Neutro Abs: 4401 {cells}/uL (ref 1500–7800)
Neutrophils Relative %: 67.7 %
Platelets: 220 10*3/uL (ref 140–400)
RBC: 4.11 10*6/uL (ref 3.80–5.10)
RDW: 12.9 % (ref 11.0–15.0)
Total Lymphocyte: 24.6 %
WBC: 6.5 10*3/uL (ref 3.8–10.8)

## 2023-08-18 LAB — URINALYSIS, ROUTINE W REFLEX MICROSCOPIC
Bilirubin Urine: NEGATIVE
Glucose, UA: NEGATIVE
Hgb urine dipstick: NEGATIVE
Ketones, ur: NEGATIVE
Leukocytes,Ua: NEGATIVE
Nitrite: NEGATIVE
Protein, ur: NEGATIVE
Specific Gravity, Urine: 1.023 (ref 1.001–1.035)
pH: 7 (ref 5.0–8.0)

## 2023-08-18 LAB — TSH: TSH: 0.32 m[IU]/L — ABNORMAL LOW

## 2023-08-18 LAB — MAGNESIUM: Magnesium: 1.9 mg/dL (ref 1.5–2.5)

## 2023-08-18 LAB — HEMOGLOBIN A1C W/OUT EAG: Hgb A1c MFr Bld: 5.6 %{Hb} (ref <5.7)

## 2023-08-18 LAB — GAMMA GT: GGT: 10 U/L (ref 3–70)

## 2023-08-18 LAB — LIPID PANEL
Cholesterol: 112 mg/dL (ref <200)
HDL: 41 mg/dL — ABNORMAL LOW (ref >=50)
LDL Cholesterol (Calc): 51 mg/dL
Non-HDL Cholesterol (Calc): 71 mg/dL (ref <130)
Total CHOL/HDL Ratio: 2.7 (calc) (ref <5.0)
Triglycerides: 114 mg/dL (ref <150)

## 2023-08-18 LAB — MICROALBUMIN / CREATININE URINE RATIO
Creatinine, Urine: 106 mg/dL (ref 20–275)
Microalb, Ur: <0.2 mg/dL

## 2023-08-18 LAB — VITAMIN D 25 HYDROXY (VIT D DEFICIENCY, FRACTURES): Vit D, 25-Hydroxy: 28 ng/mL — ABNORMAL LOW (ref 30–100)

## 2023-08-18 LAB — VITAMIN B12: Vitamin B-12: 432 pg/mL (ref 200–1100)

## 2023-09-05 ENCOUNTER — Other Ambulatory Visit: Payer: Self-pay | Admitting: Nurse Practitioner

## 2023-09-05 ENCOUNTER — Other Ambulatory Visit (HOSPITAL_COMMUNITY): Payer: Self-pay

## 2023-09-05 ENCOUNTER — Other Ambulatory Visit: Payer: Self-pay

## 2023-09-05 DIAGNOSIS — F419 Anxiety disorder, unspecified: Secondary | ICD-10-CM

## 2023-09-05 MED ORDER — SYNTHROID 112 MCG PO TABS
112.0000 ug | ORAL_TABLET | Freq: Every day | ORAL | 1 refills | Status: DC
  Filled 2023-09-05: qty 90, 90d supply, fill #0
  Filled 2023-12-19: qty 90, 90d supply, fill #1

## 2023-09-05 MED ORDER — FLUOXETINE HCL 10 MG PO CAPS
10.0000 mg | ORAL_CAPSULE | Freq: Every day | ORAL | 2 refills | Status: DC
  Filled 2023-09-05: qty 90, 90d supply, fill #0

## 2023-09-25 NOTE — Progress Notes (Signed)
 [facility] - Clinic Note  10/04/2023     CHIEF COMPLAINT Patient presents for Retina Follow Up    HISTORY OF PRESENT ILLNESS: Tracy Kidd is a 52 y.o. female who presents to the clinic today for:   HPI     Retina Follow Up   Patient presents with  Other.  In left eye.  Severity is moderate.  Duration of 1 year.  Since onset it is stable.  I, the attending physician,  performed the HPI with the patient and updated documentation appropriately.        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 Pt states VA is the same. Pt does not report any gtt use.       Last edited by Valdemar Rogue, MD on 10/04/2023  5:08 PM.    Pt states she is still seeing Dr. McCuen and Dr. Materin once a year, she sees a prosthetist every 6 months for her left eye  Referring physician: Leslee Reusing, MD [address] [place],  [place] [address]  HISTORICAL INFORMATION:   Selected notes from the MEDICAL RECORD NUMBER Referred by Dr. C. McCuen for cataract surery clearance OS;  LEE- 03.01.19 (C. 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   Polyethyl Glycol-Propyl Glycol (SYSTANE) 0.4-0.3 % SOLN Apply to eye.   No current facility-administered medications for this visit. (Ophthalmic Drugs)   Current Outpatient Medications (Other)  Medication Sig   cholecalciferol (VITAMIN D ) 1000 UNITS tablet Take 2,000 Units by mouth daily.   FLUoxetine  (PROZAC ) 10 MG capsule Take 1 capsule (10 mg total) by mouth daily for mood   levocetirizine (XYZAL ) 5 MG tablet Take 5 mg by mouth every evening.   MELATONIN PO Take 1 tablet by mouth as needed.   rosuvastatin   (CRESTOR ) 5 MG tablet Take 1 tablet (5 mg total) by mouth daily. (Patient taking differently: Take 5 mg by mouth every evening.)   SYNTHROID  112 MCG tablet Take 1 tablet (112 mcg total) by mouth daily on an empty stomach with water at least 30 minutes before a meal.  NO ANTACID MEDS, CALCIUM  OR MAGNESIUM FOR 4 HOURS. AVOID BIOTIN.   No current facility-administered medications for this visit. (Other)   REVIEW OF SYSTEMS: ROS   Positive for: Eyes, Heme/Lymph Negative for: Constitutional, Gastrointestinal, Neurological, Skin, Genitourinary, Musculoskeletal, HENT, Endocrine, Cardiovascular, Respiratory, Psychiatric, Allergic/Imm Last edited by Antonetta Almetta BRAVO, COT on 10/04/2023  1:18 PM.      ALLERGIES Allergies  Allergen Reactions   Povidone-Iodine Swelling    Only in EYE, tolerates on skin   Latanoprost Other (See Comments)    Burns and stings, severe tearing   Other Nausea And Vomiting    Some antibiotics cause nausea. Patient can take if she uses Zofran  along with antibiotic.   Oxycodone  Nausea And Vomiting    Can take if she uses Zofran  and eats a little something.   PAST MEDICAL HISTORY Past Medical History:  Diagnosis Date   Aortic atherosclerosis (HCC)    Follows w/ PCP, Lonell Liverpool, NP @ [facility].   Arthritis    hands, feet, knees  Cancer [facility]) 2014   ocular melanoma, peripapillary melanoma of left eye   Heart murmur    no problems   History of eye enucleation 09/2019   left eye, s/p lateral cathal resuspension LLL and periorbital fat grafting / Follows w/ Dr. Emil Shields Materin @ [facility].   Hyperlipidemia    Follows w/ PCP.   Hypothyroidism    Follows w/ PCP.   PONV (postoperative nausea and vomiting)    past surgery Scop patch and propofol  helped   Pulmonary nodules    see 01/26/23 CT scan in Epic   Radiation retinopathy 2014   left eye, s/p PSTK on 09/13/13 with decrease in SRF, s/p Avastin multiple  injections and Eylea    Wears contact lenses    Wears glasses    Past Surgical History:  Procedure Laterality Date   CATARACT EXTRACTION  02/2018   ENUCLEATION Left 2021   left eye, s/p lateral canthal resuspension LLL and periorbital fat grafting   EYE SURGERY  2014   radiation plaque placement and removal   FINE NEEDLE ASPIRATION Left 10/03/2019   left eye   LAPAROSCOPIC SUPRACERVICAL HYSTERECTOMY Bilateral 04/26/2023   Procedure: LAPAROSCOPIC SUPRACERVICAL HYSTERECTOMY AND BILATERAL SALPINGECTOMY;  Surgeon: Horacio Boas, MD;  Location: [facility];  Service: Gynecology;  Laterality: Bilateral;   LAPAROSCOPY N/A 09/13/2013   Procedure: LAPAROSCOPY OPERATIVE, removal of pelvic mass;  Surgeon: Nathanel LELON Bunker, MD;  Location: [facility];  Service: Gynecology;  Laterality: N/A;   VITRECTOMY Left 2021   WISDOM TOOTH EXTRACTION     FAMILY HISTORY Family History  Problem Relation Age of Onset   Varicose Veins Mother    Arthritis Mother    Cancer Mother        Melanoma   Nephrolithiasis Father    Hyperlipidemia Father    Autoimmune disease Father        atrophic gastritis/B12 def   Cancer Maternal Grandmother        Lung   Heart disease Maternal Grandfather        CHF   Cancer Maternal Aunt 38       melanoma   SOCIAL HISTORY Social History   Tobacco Use   Smoking status: Former    Current packs/day: 0.00    Types: Cigarettes    Start date: 76    Quit date: 1995    Years since quitting: 30.0   Smokeless tobacco: Never   Tobacco comments:    Smoked 1 or 2 cigarettes per day as a teenager.  Vaping Use   Vaping status: Never Used  Substance Use Topics   Alcohol use: Yes    Alcohol/week: 1.0 standard drink of alcohol    Types: 1 Glasses of wine per week    Comment: about one drink of wine or liquor per week   Drug use: No       OPHTHALMIC EXAM:  Base Eye Exam     Visual Acuity (Snellen - Linear)       Right Left   Dist cc 20/20      Correction: Glasses         Tonometry (Tonopen, 1:21 PM)       Right Left   Pressure 13          Pupils       Dark Light Shape React APD   Right 3 2 Round Brisk None   Left              Visual  Fields       Left Right     Full         Extraocular Movement       Right Left    Full, Ortho Full, Ortho         Neuro/Psych     Oriented x3: Yes   Mood/Affect: Normal         Dilation     Right eye: 1.0% Mydriacyl, 2.5% Phenylephrine  @ 1:22 PM           Slit Lamp and Fundus Exam     Slit Lamp Exam       Right Left   Lids/Lashes Dermatochalasis - upper lid normal, mild MGD   Conjunctiva/Sclera White and quiet pink, prosthetic   Cornea Clear    Anterior Chamber deep and clear    Iris Round and dilated, mild PPM nasal    Lens 1+ Cortical cataract    Anterior Vitreous Mild syneresis          Fundus Exam       Right Left   Disc Pink and Sharp, Compact, mild tilt    C/D Ratio 0.35    Macula Flat, Good foveal reflex, RPE mottling, No heme or edema    Vessels Normal    Periphery Attached, Lattice degeneration with good laser surrounding at 0730 and 1030-1130, no new RT/RD or lattice            Refraction     Wearing Rx       Sphere Cylinder Axis   Right -6.50 +0.50 006   Left -5.50 Sphere     Type: PAL           IMAGING AND PROCEDURES  Imaging and Procedures for 12/05/17  OCT, Retina - OU - Both Eyes       Right Eye Quality was good. Central Foveal Thickness: 259. Progression has been stable. Findings include normal foveal contour, no IRF, no SRF, vitreomacular adhesion .   Notes *Images captured and stored on drive  Diagnosis / Impression:  OD: NFP, No IRF/SRF OS: no images -- prosthetic, s/p enucleation  Clinical management:  See below  Abbreviations: NFP - Normal foveal profile. CME - cystoid macular edema. PED - pigment epithelial detachment. IRF - intraretinal fluid. SRF - subretinal fluid. EZ - ellipsoid zone.  ERM - epiretinal membrane. ORA - outer retinal atrophy. ORT - outer retinal tubulation. SRHM - subretinal hyper-reflective material             ASSESSMENT/PLAN:    ICD-10-CM   1. 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 H/O enucleation of left eyeball  Z90.01     3. 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 Lattice degeneration of peripheral retina, right  H35.411     5. Cortical cataract of right eye  H26.9      1-3. History of choroidal melanoma s/p I-125 brachytherapy 2014 OS  - underwent plaque therapy at [facility], [place] in 2014 - history of multiple IVA injections post plaque, q6 mos - follows with Dr. Beatris - developed recurrence of choroidal melanoma with vitreous hemorrhage in December 2020 - underwent vitrectomy and then enucleation OS in Jan of 2021 - prosthetic now in place -- looks great - recommend polycarbonate glasses - pt is cleared from a retina standpoint for release to Dr. McCuen and resumption of primary eye care  4. Lattice degeneration, OD - patches at 0730 and 1030-1130 - s/p laser retinopexy with Dr. Alvia - good laser in place - no new lattice, RT/RD - monitor  5. Cortical cataract, OD - The symptoms of cataract, surgical options, and treatments and risks were discussed with patient. - discussed diagnosis and progression - not yet visually significant - monitor  Ophthalmic Meds Ordered this visit:  No orders of the defined types were placed in this encounter.    Return if symptoms worsen or fail to improve.  There are no Patient Instructions on file for this visit.   Explained the diagnoses, plan, and follow up with the patient and they expressed understanding.  Patient expressed understanding of the importance of proper follow up care.   This document serves as a record of services personally performed by Redell JUDITHANN Hans, MD, PhD. It was created on their behalf by Almetta Pesa, an ophthalmic technician. The creation of this record is the provider's dictation and/or activities during the visit.    Electronically signed by: Almetta Pesa, OA, 10/04/23  5:14 PM  This document serves as a record of services personally performed by Redell JUDITHANN Hans, MD, PhD. It was created on their behalf by Alan PARAS. Delores, OA an ophthalmic technician. The creation of this record is the provider's dictation and/or activities during the visit.    Electronically signed by: Alan PARAS. Delores, OA 10/04/23 5:14 PM  Redell JUDITHANN Hans, M.D., Ph.D. Diseases & Surgery of the Retina and Vitreous [facility]  I have reviewed the above documentation for accuracy and completeness, and I agree with the above. Redell JUDITHANN Hans, M.D., Ph.D. 10/04/23 5:14 PM   Abbreviations: M myopia (nearsighted); A astigmatism; H hyperopia (farsighted); P presbyopia; Mrx spectacle prescription;  CTL contact lenses; OD right eye; OS left eye; OU both eyes  XT exotropia; ET esotropia; PEK punctate epithelial keratitis; PEE punctate epithelial erosions; DES dry eye syndrome; MGD meibomian gland dysfunction; ATs artificial tears; PFAT's preservative free artificial tears; NSC nuclear sclerotic cataract; PSC posterior subcapsular cataract; ERM epi-retinal membrane; PVD posterior vitreous detachment; RD retinal detachment; DM diabetes mellitus; DR diabetic retinopathy; NPDR non-proliferative diabetic retinopathy; PDR proliferative diabetic retinopathy; CSME clinically significant macular edema; DME diabetic macular edema; dbh dot blot hemorrhages; CWS cotton wool spot; POAG primary open angle glaucoma; C/D cup-to-disc ratio; HVF humphrey visual field; GVF goldmann visual field; OCT optical coherence tomography; IOP intraocular pressure; BRVO Branch retinal vein occlusion; CRVO central retinal vein occlusion; CRAO central retinal artery occlusion; BRAO branch retinal artery occlusion; RT retinal tear; SB  scleral buckle; PPV pars plana vitrectomy; VH Vitreous hemorrhage; PRP panretinal laser photocoagulation; IVK intravitreal kenalog; VMT vitreomacular traction; MH Macular hole;  NVD neovascularization of the disc; NVE neovascularization elsewhere; AREDS age related eye disease study; ARMD age related macular degeneration; POAG primary open angle glaucoma; EBMD epithelial/anterior basement membrane dystrophy; ACIOL anterior chamber intraocular lens; IOL intraocular lens; PCIOL posterior chamber intraocular lens; Phaco/IOL phacoemulsification with intraocular lens placement; PRK photorefractive keratectomy; LASIK laser assisted in situ keratomileusis; HTN hypertension; DM diabetes mellitus; COPD chronic obstructive pulmonary disease

## 2023-10-04 ENCOUNTER — Encounter (INDEPENDENT_AMBULATORY_CARE_PROVIDER_SITE_OTHER): Payer: Self-pay | Admitting: Ophthalmology

## 2023-10-04 ENCOUNTER — Ambulatory Visit (INDEPENDENT_AMBULATORY_CARE_PROVIDER_SITE_OTHER): Payer: 59 | Admitting: Ophthalmology

## 2023-10-04 DIAGNOSIS — Z970 Presence of artificial eye: Secondary | ICD-10-CM

## 2023-10-04 DIAGNOSIS — H35411 Lattice degeneration of retina, right eye: Secondary | ICD-10-CM

## 2023-10-04 DIAGNOSIS — H2511 Age-related nuclear cataract, right eye: Secondary | ICD-10-CM

## 2023-10-04 DIAGNOSIS — Z9001 Acquired absence of eye: Secondary | ICD-10-CM

## 2023-10-04 DIAGNOSIS — C6932 Malignant neoplasm of left choroid: Secondary | ICD-10-CM | POA: Diagnosis not present

## 2023-10-04 DIAGNOSIS — H269 Unspecified cataract: Secondary | ICD-10-CM | POA: Diagnosis not present

## 2023-10-26 ENCOUNTER — Ambulatory Visit: Payer: 59 | Admitting: Nurse Practitioner

## 2023-12-01 DIAGNOSIS — R590 Localized enlarged lymph nodes: Secondary | ICD-10-CM | POA: Diagnosis not present

## 2023-12-01 DIAGNOSIS — Z85840 Personal history of malignant neoplasm of eye: Secondary | ICD-10-CM | POA: Diagnosis not present

## 2023-12-01 DIAGNOSIS — K769 Liver disease, unspecified: Secondary | ICD-10-CM | POA: Diagnosis not present

## 2023-12-01 DIAGNOSIS — R918 Other nonspecific abnormal finding of lung field: Secondary | ICD-10-CM | POA: Diagnosis not present

## 2023-12-01 DIAGNOSIS — C6932 Malignant neoplasm of left choroid: Secondary | ICD-10-CM | POA: Diagnosis not present

## 2023-12-11 DIAGNOSIS — C6932 Malignant neoplasm of left choroid: Secondary | ICD-10-CM | POA: Diagnosis not present

## 2023-12-12 DIAGNOSIS — H5213 Myopia, bilateral: Secondary | ICD-10-CM | POA: Diagnosis not present

## 2023-12-20 ENCOUNTER — Other Ambulatory Visit (HOSPITAL_COMMUNITY): Payer: Self-pay

## 2023-12-27 ENCOUNTER — Encounter: Payer: Self-pay | Admitting: Internal Medicine

## 2024-02-15 ENCOUNTER — Ambulatory Visit: Payer: 59 | Admitting: Nurse Practitioner

## 2024-03-13 DIAGNOSIS — C6932 Malignant neoplasm of left choroid: Secondary | ICD-10-CM | POA: Diagnosis not present

## 2024-03-13 DIAGNOSIS — Q111 Other anophthalmos: Secondary | ICD-10-CM | POA: Diagnosis not present

## 2024-03-14 ENCOUNTER — Other Ambulatory Visit (HOSPITAL_COMMUNITY): Payer: Self-pay

## 2024-03-14 DIAGNOSIS — E782 Mixed hyperlipidemia: Secondary | ICD-10-CM | POA: Diagnosis not present

## 2024-03-14 DIAGNOSIS — E890 Postprocedural hypothyroidism: Secondary | ICD-10-CM | POA: Diagnosis not present

## 2024-03-14 DIAGNOSIS — R7301 Impaired fasting glucose: Secondary | ICD-10-CM | POA: Diagnosis not present

## 2024-03-14 MED ORDER — ROSUVASTATIN CALCIUM 5 MG PO TABS
5.0000 mg | ORAL_TABLET | Freq: Every day | ORAL | 1 refills | Status: AC
  Filled 2024-03-14: qty 90, 90d supply, fill #0

## 2024-03-15 ENCOUNTER — Other Ambulatory Visit (HOSPITAL_COMMUNITY): Payer: Self-pay

## 2024-03-15 MED ORDER — LEVOTHYROXINE SODIUM 112 MCG PO TABS
112.0000 ug | ORAL_TABLET | Freq: Every morning | ORAL | 5 refills | Status: AC
  Filled 2024-03-15: qty 30, 30d supply, fill #0
  Filled 2024-04-25: qty 30, 30d supply, fill #1
  Filled 2024-05-29: qty 30, 30d supply, fill #2
  Filled 2024-06-24: qty 30, 30d supply, fill #3

## 2024-03-21 ENCOUNTER — Other Ambulatory Visit (HOSPITAL_COMMUNITY): Payer: Self-pay

## 2024-04-10 DIAGNOSIS — K7689 Other specified diseases of liver: Secondary | ICD-10-CM | POA: Diagnosis not present

## 2024-04-10 DIAGNOSIS — C6932 Malignant neoplasm of left choroid: Secondary | ICD-10-CM | POA: Diagnosis not present

## 2024-04-10 DIAGNOSIS — C439 Malignant melanoma of skin, unspecified: Secondary | ICD-10-CM | POA: Diagnosis not present

## 2024-04-10 DIAGNOSIS — R918 Other nonspecific abnormal finding of lung field: Secondary | ICD-10-CM | POA: Diagnosis not present

## 2024-04-15 DIAGNOSIS — Z9189 Other specified personal risk factors, not elsewhere classified: Secondary | ICD-10-CM | POA: Diagnosis not present

## 2024-04-15 DIAGNOSIS — C6932 Malignant neoplasm of left choroid: Secondary | ICD-10-CM | POA: Diagnosis not present

## 2024-04-25 ENCOUNTER — Other Ambulatory Visit (HOSPITAL_COMMUNITY): Payer: Self-pay

## 2024-04-30 ENCOUNTER — Other Ambulatory Visit (HOSPITAL_COMMUNITY): Payer: Self-pay

## 2024-04-30 DIAGNOSIS — L820 Inflamed seborrheic keratosis: Secondary | ICD-10-CM | POA: Diagnosis not present

## 2024-04-30 DIAGNOSIS — D2262 Melanocytic nevi of left upper limb, including shoulder: Secondary | ICD-10-CM | POA: Diagnosis not present

## 2024-04-30 DIAGNOSIS — L218 Other seborrheic dermatitis: Secondary | ICD-10-CM | POA: Diagnosis not present

## 2024-04-30 DIAGNOSIS — D485 Neoplasm of uncertain behavior of skin: Secondary | ICD-10-CM | POA: Diagnosis not present

## 2024-04-30 DIAGNOSIS — D2261 Melanocytic nevi of right upper limb, including shoulder: Secondary | ICD-10-CM | POA: Diagnosis not present

## 2024-04-30 MED ORDER — FLUOCINONIDE 0.05 % EX SOLN
1.0000 | Freq: Two times a day (BID) | CUTANEOUS | 1 refills | Status: AC
  Filled 2024-04-30: qty 60, 30d supply, fill #0

## 2024-05-16 ENCOUNTER — Ambulatory Visit: Admitting: Family Medicine

## 2024-05-29 ENCOUNTER — Other Ambulatory Visit (HOSPITAL_COMMUNITY): Payer: Self-pay

## 2024-05-30 DIAGNOSIS — Z124 Encounter for screening for malignant neoplasm of cervix: Secondary | ICD-10-CM | POA: Diagnosis not present

## 2024-05-30 DIAGNOSIS — Z1231 Encounter for screening mammogram for malignant neoplasm of breast: Secondary | ICD-10-CM | POA: Diagnosis not present

## 2024-05-30 DIAGNOSIS — Z130 Encounter for screening for diseases of the blood and blood-forming organs and certain disorders involving the immune mechanism: Secondary | ICD-10-CM | POA: Diagnosis not present

## 2024-05-30 DIAGNOSIS — Z01419 Encounter for gynecological examination (general) (routine) without abnormal findings: Secondary | ICD-10-CM | POA: Diagnosis not present

## 2024-05-30 DIAGNOSIS — Z1151 Encounter for screening for human papillomavirus (HPV): Secondary | ICD-10-CM | POA: Diagnosis not present

## 2024-05-30 DIAGNOSIS — Z1389 Encounter for screening for other disorder: Secondary | ICD-10-CM | POA: Diagnosis not present

## 2024-06-07 ENCOUNTER — Other Ambulatory Visit: Payer: Self-pay | Admitting: Obstetrics and Gynecology

## 2024-06-07 DIAGNOSIS — N6489 Other specified disorders of breast: Secondary | ICD-10-CM

## 2024-06-07 DIAGNOSIS — R928 Other abnormal and inconclusive findings on diagnostic imaging of breast: Secondary | ICD-10-CM

## 2024-06-13 ENCOUNTER — Encounter: Payer: Self-pay | Admitting: Emergency Medicine

## 2024-06-13 ENCOUNTER — Ambulatory Visit: Admitting: Emergency Medicine

## 2024-06-13 ENCOUNTER — Ambulatory Visit
Admission: RE | Admit: 2024-06-13 | Discharge: 2024-06-13 | Disposition: A | Discharge status: Home / Self Care | Source: Ambulatory Visit | Attending: Obstetrics and Gynecology | Admitting: Obstetrics and Gynecology

## 2024-06-13 ENCOUNTER — Ambulatory Visit

## 2024-06-13 VITALS — BP 134/82 | HR 79 | Ht 63.0 in | Wt 146.0 lb

## 2024-06-13 DIAGNOSIS — E782 Mixed hyperlipidemia: Secondary | ICD-10-CM | POA: Insufficient documentation

## 2024-06-13 DIAGNOSIS — N6489 Other specified disorders of breast: Secondary | ICD-10-CM

## 2024-06-13 DIAGNOSIS — R928 Other abnormal and inconclusive findings on diagnostic imaging of breast: Secondary | ICD-10-CM

## 2024-06-13 DIAGNOSIS — R911 Solitary pulmonary nodule: Secondary | ICD-10-CM

## 2024-06-13 DIAGNOSIS — C6932 Malignant neoplasm of left choroid: Secondary | ICD-10-CM | POA: Insufficient documentation

## 2024-06-13 NOTE — Patient Instructions (Signed)
  VISIT SUMMARY: During your visit, we discussed the surveillance of your pulmonary nodule and your history of peripapillary choroidal melanoma. We reviewed your recent CT scan results and planned the next steps for monitoring your health.  YOUR PLAN: -INDETERMINATE GROUND GLASS PULMONARY NODULE, LEFT LOWER LOBE: You have a small nodule in your left lung that has shown minimal change over the past ten years, which makes it unlikely to be cancerous. We will continue to monitor it with a CT scan of your chest on July 17, 2024. If there are significant changes, we may consider a bronchoscopy to take a closer look, though this procedure carries a small risk of lung collapse. We will also discuss the possibility of a PET scan if the nodule increases in size. Please ensure you sign the release of information form so we can access your imaging records.  -RECURRENT PERIPAPILLARY CHOROIDAL MELANOMA OF LEFT EYE: Your eye condition, which has a high risk of spreading, is being closely monitored. There is no current evidence of spread to your lungs or liver. We will continue to perform chest CT scans and liver MRIs every three months to keep a close watch on your condition.  INSTRUCTIONS: Please follow up with a CT scan of your chest on July 17, 2024. Ensure that you have signed the release of information form for image access. Continue with your regular chest CT and liver MRI every three months for ongoing surveillance.

## 2024-06-13 NOTE — Progress Notes (Signed)
 Subjective:    Patient ID: Tracy Kidd, female    DOB: Aug 26, 1971, 53 y.o.   MRN: [id number]  HPI Discussed the use of AI scribe software for clinical note transcription with the patient, who gave verbal consent to proceed.  History of Present Illness Tracy Kidd is a 53 year old female with peripapillary choroidal melanoma who presents for surveillance of a pulmonary nodule. She was referred by Dr. Belvie for evaluation of her pulmonary nodule.  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 Surveillance for possible metastatic disease includes serial imaging, such as CT scans of the chest and liver MRI every three months. The most recent CT scan on April 10, 2024, showed an 11 mm ground glass pulmonary nodule in the left lower lobe, which was previously measured at 10 mm in May 2023. The nodule has shown minimal change over the years, measuring approximately 8 mm in 2017 and 9 mm in 2019.  She has a minimal tobacco history, having smoked a few cigarettes as a teenager. There are no significant occupational exposures, and she has lived in [place]  for most of her life. She reported mold exposure in a previous home, which was remediated in 2015.  Her family history is significant for primary lung cancer in her maternal grandmother, who was a smoker and passed away at the age of 44.  Her past medical history includes aortic atherosclerosis, hyperlipidemia, and hypothyroidism. She is currently not on any treatment for her melanoma and is under surveillance.  No significant symptoms reported related to the pulmonary nodule. No history of significant occupational exposures or Financial planner.    Results RADIOLOGY CT Chest: Resolution of previously identified nodular foci in right upper and left lower lobes. Stable 11 mm ground glass pulmonary nodule at left lung base. No significant mediastinal or  hilar adenopathy. (04/10/2024) CT Chest, Abdomen, Pelvis: Left lower lobe ground glass nodule approximately 9 mm. (07/02/2018) CT Chest, Abdomen, Pelvis: Waxing and waning ground glass pulmonary nodular disease without clear evidence of metastatic disease or primary lung cancer. Stable subcentimeter ground glass nodule in left lower lobe. (2015)    Review of Systems As per HPI  Past Medical History:  Diagnosis Date   Aortic atherosclerosis (HCC)    Follows w/ PCP, Lonell Liverpool, NP @ [facility].   Arthritis    hands, feet, knees   Cancer (HCC) 2014   ocular melanoma, peripapillary melanoma of left eye   Heart murmur    no problems   History of eye enucleation 09/2019   left eye, s/p lateral cathal resuspension LLL and periorbital fat grafting / Follows w/ Dr. Emil Shields Materin @ [facility].   Hyperlipidemia    Follows w/ PCP.   Hypothyroidism    Follows w/ PCP.   PONV (postoperative nausea and vomiting)    past surgery Scop patch and propofol  helped   Pulmonary nodules    see 01/26/23 CT scan in Epic   Radiation retinopathy 2014   left eye, s/p PSTK on 09/13/13 with decrease in SRF, s/p Avastin multiple injections and Eylea    Wears contact lenses    Wears glasses     Family History  Problem Relation Age of Onset   Varicose Veins Mother    Arthritis Mother    Cancer Mother        Melanoma   Nephrolithiasis Father  Hyperlipidemia Father    Autoimmune disease Father        atrophic gastritis/B12 def   Cancer Maternal Grandmother        Lung   Heart disease Maternal Grandfather        CHF   Cancer Maternal Aunt 48       melanoma     Social History   Socioeconomic History   Marital status: Legally Separated    Spouse name: Not on file   Number of children: Not on file   Years of education: Not on file   Highest education level: Not on file  Occupational History   Not on file  Tobacco Use   Smoking status: Former     Current packs/day: 0.00    Types: Cigarettes    Start date: 58    Quit date: 1995    Years since quitting: 30.7   Smokeless tobacco: Never   Tobacco comments:    Smoked 1 or 2 cigarettes per day as a teenager.  Vaping Use   Vaping status: Never Used  Substance and Sexual Activity   Alcohol use: Yes    Alcohol/week: 1.0 standard drink of alcohol    Types: 1 Glasses of wine per week    Comment: about one drink of wine or liquor per week   Drug use: No   Sexual activity: Yes    Birth control/protection: None  Other Topics Concern   Not on file  Social History Narrative   Not on file   Social Drivers of Health   Financial Resource Strain: Not on file  Food Insecurity: Not on file  Transportation Needs: Not on file  Physical Activity: Not on file  Stress: Not on file  Social Connections: Not on file  Intimate Partner Violence: Not on file    Allergies  Allergen Reactions   Povidone-Iodine Swelling    Only in EYE, tolerates on skin   Latanoprost Other (See Comments)    Burns and stings, severe tearing   Other Nausea And Vomiting    Some antibiotics cause nausea. Patient can take if she uses Zofran  along with antibiotic.   Oxycodone  Nausea And Vomiting    Can take if she uses Zofran  and eats a little something.    Current Outpatient Medications on File Prior to Visit  Medication Sig Dispense Refill   carboxymethylcellulose (REFRESH PLUS) 0.5 % SOLN Apply to eye.     cholecalciferol (VITAMIN D ) 1000 UNITS tablet Take 2,000 Units by mouth daily.     fluocinonide  (LIDEX ) 0.05 % external solution Apply a small amount to skin twice a day as needed x1-2 weeks, then taper to use as needed for flares. 60 mL 1   levocetirizine (XYZAL) 5 MG tablet Take 5 mg by mouth every evening.     levothyroxine  (SYNTHROID ) 112 MCG tablet Take 1 tablet (112 mcg total) by mouth in the morning on an empty stomach.. 30 tablet 5   MELATONIN PO Take 1 tablet by mouth as needed.     Polyethyl  Glycol-Propyl Glycol (SYSTANE) 0.4-0.3 % SOLN Apply to eye.     rosuvastatin  (CRESTOR ) 5 MG tablet Take 1 tablet (5 mg total) by mouth daily. 90 tablet 1   No current facility-administered medications on file prior to visit.       Objective:    Vitals:   06/13/24 1257  BP: 134/82  Pulse: 79  SpO2: 99%  Weight: 146 lb (66.2 kg)  Height: 5' 3 (1.6 m)  Physical Exam Gen: Pleasant, well-nourished, in no distress,  normal affect  ENT: No lesions,  mouth clear,  oropharynx clear, no postnasal drip  Neck: No JVD, no stridor  Lungs: No use of accessory muscles, no crackles or wheezing on normal respiration, no wheeze on forced expiration  Cardiovascular: RRR, heart sounds normal, no murmur or gallops, no peripheral edema  Musculoskeletal: No deformities, no cyanosis or clubbing  Neuro: alert, awake, non focal  Skin: Warm, no lesions or rashes       Assessment & Plan:   Assessment & Plan   Assessment and Plan Assessment & Plan Indeterminate ground glass pulmonary nodule, left lower lobe Minimal change over ten years, slow growth atypical for malignancy, low likelihood of malignancy. - Continue CT chest surveillance on July 17, 2024. - Obtain [facility] imaging access for review. - Consider bronchoscopy for biopsy if significant changes occur, 2% pneumothorax risk. - Discuss PET scan if nodule size increases. - Ensure release of information form is signed for image access.  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 - Continue chest CT and liver MRI every three months.   Return in about 7 weeks (around 07/29/2024) for With Dr. Shelah, To review CT scan of the chest.   Lamar Shelah, MD, PhD 06/13/2024, 1:37 PM [facility] [phone] or if no answer before 7:00PM call [phone] For any issues after 7:00PM please call eLink  [phone]

## 2024-06-14 DIAGNOSIS — E890 Postprocedural hypothyroidism: Secondary | ICD-10-CM | POA: Diagnosis not present

## 2024-06-14 DIAGNOSIS — E782 Mixed hyperlipidemia: Secondary | ICD-10-CM | POA: Diagnosis not present

## 2024-06-14 DIAGNOSIS — R7301 Impaired fasting glucose: Secondary | ICD-10-CM | POA: Diagnosis not present

## 2024-06-18 ENCOUNTER — Other Ambulatory Visit (HOSPITAL_COMMUNITY): Payer: Self-pay

## 2024-06-18 DIAGNOSIS — Z9109 Other allergy status, other than to drugs and biological substances: Secondary | ICD-10-CM | POA: Diagnosis not present

## 2024-06-18 DIAGNOSIS — R911 Solitary pulmonary nodule: Secondary | ICD-10-CM | POA: Diagnosis not present

## 2024-06-18 DIAGNOSIS — E890 Postprocedural hypothyroidism: Secondary | ICD-10-CM | POA: Diagnosis not present

## 2024-06-18 DIAGNOSIS — E782 Mixed hyperlipidemia: Secondary | ICD-10-CM | POA: Diagnosis not present

## 2024-06-18 MED ORDER — SYNTHROID 112 MCG PO TABS
112.0000 ug | ORAL_TABLET | Freq: Every morning | ORAL | 0 refills | Status: AC
  Filled 2024-06-18 – 2024-06-25 (×6): qty 90, 90d supply, fill #0

## 2024-06-18 MED ORDER — ROSUVASTATIN CALCIUM 5 MG PO TABS
5.0000 mg | ORAL_TABLET | Freq: Every day | ORAL | 0 refills | Status: AC
  Filled 2024-06-18: qty 90, 90d supply, fill #0

## 2024-06-18 MED ORDER — LEVOCETIRIZINE DIHYDROCHLORIDE 5 MG PO TABS
5.0000 mg | ORAL_TABLET | Freq: Every evening | ORAL | 1 refills | Status: AC
  Filled 2024-06-18: qty 90, 90d supply, fill #0

## 2024-06-18 MED ORDER — AZELASTINE HCL 0.1 % NA SOLN
1.0000 | Freq: Two times a day (BID) | NASAL | 1 refills | Status: AC
  Filled 2024-06-18: qty 30, 50d supply, fill #0

## 2024-06-25 ENCOUNTER — Other Ambulatory Visit: Payer: Self-pay

## 2024-06-25 ENCOUNTER — Other Ambulatory Visit (HOSPITAL_COMMUNITY): Payer: Self-pay

## 2024-07-03 ENCOUNTER — Other Ambulatory Visit: Payer: Self-pay

## 2024-07-15 ENCOUNTER — Inpatient Hospital Stay
Admission: RE | Admit: 2024-07-15 | Discharge: 2024-07-15 | Disposition: A | Payer: Self-pay | Source: Ambulatory Visit | Attending: Emergency Medicine | Admitting: Emergency Medicine

## 2024-07-15 ENCOUNTER — Telehealth: Payer: Self-pay

## 2024-07-15 DIAGNOSIS — R911 Solitary pulmonary nodule: Secondary | ICD-10-CM

## 2024-07-15 NOTE — Telephone Encounter (Signed)
 I have requested Images from [facility] through [facility].

## 2024-07-16 DIAGNOSIS — K7689 Other specified diseases of liver: Secondary | ICD-10-CM | POA: Diagnosis not present

## 2024-07-16 DIAGNOSIS — C6932 Malignant neoplasm of left choroid: Secondary | ICD-10-CM | POA: Diagnosis not present

## 2024-07-19 NOTE — Telephone Encounter (Signed)
 Called and spoke with [facility]. Images from [facility] are going to be uploaded into Outside CT order.

## 2024-07-22 NOTE — Telephone Encounter (Signed)
 CT has been uploaded into pts chart.

## 2024-07-24 ENCOUNTER — Encounter: Payer: 59 | Admitting: Nurse Practitioner

## 2024-07-24 NOTE — Telephone Encounter (Signed)
Thank you I will review

## 2024-08-05 DIAGNOSIS — R911 Solitary pulmonary nodule: Secondary | ICD-10-CM | POA: Diagnosis not present

## 2024-08-05 DIAGNOSIS — C6932 Malignant neoplasm of left choroid: Secondary | ICD-10-CM | POA: Diagnosis not present

## 2024-08-15 ENCOUNTER — Encounter: Payer: Self-pay | Admitting: Emergency Medicine

## 2024-08-15 ENCOUNTER — Ambulatory Visit: Admitting: Emergency Medicine

## 2024-08-15 VITALS — BP 136/82 | HR 77 | Temp 97.6°F | Ht 63.0 in | Wt 195.4 lb

## 2024-08-15 DIAGNOSIS — R911 Solitary pulmonary nodule: Secondary | ICD-10-CM | POA: Diagnosis not present

## 2024-08-15 NOTE — Patient Instructions (Signed)
 We reviewed his CT scan of the chest from [facility].  This is stable compared to priors. Get your surveillance imaging as per [facility] plans and we will follow-up in August 2026 to review.

## 2024-08-15 NOTE — Progress Notes (Signed)
 Subjective:    Patient ID: Tracy Kidd, female    DOB: 07/04/1971, 53 y.o.   MRN: [id number]  HPI  History of Present Illness   ROV 08/15/2024 --53 year old woman with a history of Papulary choroidal melanoma (2014, recurrence 2021) she has undergone frequent surveillance imaging that has included MRI and CT chest and has a ground glass left lower lobe pulmonary nodule that is 11 mm, possibly slightly larger than 2023 and 2017.  She underwent repeat CT chest 07/17/2024, also her MRI surveillance.  Overall she has been doing well without any respiratory issues or symptoms.  CT chest 07/16/2024 ([facility]) shows stable mixed density left lower lobe pulmonary nodule approximately 10-11 mm.  No new pulmonary nodule seen    Review of Systems As per HPI  Past Medical History:  Diagnosis Date   Aortic atherosclerosis    Follows w/ PCP, Lonell Liverpool, NP @ [facility].   Arthritis    hands, feet, knees   Cancer (HCC) 2014   ocular melanoma, peripapillary melanoma of left eye   Heart murmur    no problems   History of eye enucleation 09/2019   left eye, s/p lateral cathal resuspension LLL and periorbital fat grafting / Follows w/ Dr. Emil Shields Materin @ [facility].   Hyperlipidemia    Follows w/ PCP.   Hypothyroidism    Follows w/ PCP.   PONV (postoperative nausea and vomiting)    past surgery Scop patch and propofol  helped   Pulmonary nodules    see 01/26/23 CT scan in Epic   Radiation retinopathy 2014   left eye, s/p PSTK on 09/13/13 with decrease in SRF, s/p Avastin multiple injections and Eylea    Wears contact lenses    Wears glasses     Family History  Problem Relation Age of Onset   Varicose Veins Mother    Arthritis Mother    Cancer Mother        Melanoma   Nephrolithiasis Father    Hyperlipidemia Father    Autoimmune disease Father        atrophic gastritis/B12 def   Cancer Maternal Grandmother        Lung   Heart  disease Maternal Grandfather        CHF   Cancer Maternal Aunt 3       melanoma     Social History   Socioeconomic History   Marital status: Legally Separated    Spouse name: Not on file   Number of children: Not on file   Years of education: Not on file   Highest education level: Not on file  Occupational History   Not on file  Tobacco Use   Smoking status: Former    Current packs/day: 0.00    Types: Cigarettes    Start date: 2    Quit date: 1995    Years since quitting: 30.9   Smokeless tobacco: Never   Tobacco comments:    Smoked 1 or 2 cigarettes per day as a teenager.  Vaping Use   Vaping status: Never Used  Substance and Sexual Activity   Alcohol use: Yes    Alcohol/week: 1.0 standard drink of alcohol    Types: 1 Glasses of wine per week    Comment: about one drink of wine or liquor per week   Drug use: No   Sexual activity: Yes    Birth control/protection: None  Other Topics Concern   Not on file  Social History Narrative   Not on file   Social Drivers of Health   Financial Resource Strain: Not on file  Food Insecurity: Not on file  Transportation Needs: Not on file  Physical Activity: Not on file  Stress: Not on file  Social Connections: Not on file  Intimate Partner Violence: Not on file    Allergies  Allergen Reactions   Povidone-Iodine Swelling    Only in EYE, tolerates on skin   Latanoprost Other (See Comments)    Burns and stings, severe tearing   Other Nausea And Vomiting    Some antibiotics cause nausea. Patient can take if she uses Zofran  along with antibiotic.   Oxycodone  Nausea And Vomiting    Can take if she uses Zofran  and eats a little something.    Current Outpatient Medications on File Prior to Visit  Medication Sig Dispense Refill   azelastine  (ASTELIN ) 0.1 % nasal spray Place 1 spray into both nostrils 2 (two) times daily. 30 mL 1   carboxymethylcellulose (REFRESH PLUS) 0.5 % SOLN Apply to eye.     cholecalciferol  (VITAMIN D ) 1000 UNITS tablet Take 2,000 Units by mouth daily.     fluocinonide  (LIDEX ) 0.05 % external solution Apply a small amount to skin twice a day as needed x1-2 weeks, then taper to use as needed for flares. 60 mL 1   levocetirizine (XYZAL ) 5 MG tablet Take 5 mg by mouth every evening.     levocetirizine (XYZAL ) 5 MG tablet Take 1 tablet (5 mg total) by mouth every evening. 90 tablet 1   levothyroxine  (SYNTHROID ) 112 MCG tablet Take 1 tablet (112 mcg total) by mouth in the morning on an empty stomach.. 30 tablet 5   MELATONIN PO Take 1 tablet by mouth as needed.     Polyethyl Glycol-Propyl Glycol (SYSTANE) 0.4-0.3 % SOLN Apply to eye.     rosuvastatin  (CRESTOR ) 5 MG tablet Take 1 tablet (5 mg total) by mouth daily. 90 tablet 1   rosuvastatin  (CRESTOR ) 5 MG tablet Take 1 tablet (5 mg total) by mouth daily. 90 tablet 0   SYNTHROID  112 MCG tablet Take 1 tablet (112 mcg total) by mouth every morning on an empty stomach. 90 tablet 0   No current facility-administered medications on file prior to visit.       Objective:    Vitals:   08/15/24 1332  BP: 136/82  Pulse: 77  Temp: 97.6 F (36.4 C)  SpO2: 98%  Weight: 195 lb 6.4 oz (88.6 kg)  Height: 5' 3 (1.6 m)   Physical Exam Gen: Pleasant, well-nourished, in no distress,  normal affect  ENT: No lesions,  mouth clear,  oropharynx clear, no postnasal drip  Neck: No JVD, no stridor  Lungs: No use of accessory muscles, no crackles or wheezing on normal respiration, no wheeze on forced expiration  Cardiovascular: RRR, heart sounds normal, no murmur or gallops, no peripheral edema  Musculoskeletal: No deformities, no cyanosis or clubbing  Neuro: alert, awake, non focal  Skin: Warm, no lesions or rashes       Assessment & Plan:   Assessment & Plan Pulmonary nodule I have compared the reports from her many serial CT scans the chest going back to 2017.  Also have most recent images available for review.  Her left lower lobe  ground glass pulmonary nodule appears to have been stable based on most of the radiology interpretations.  There may have been some slight increase in size over this time,  but minimal.  Highly differentiated adenocarcinoma of the lung is still possible.  I think the most appropriate plan is to continue her surveillance.  She really does not need a repeat CT chest with regard to the ground glass pulmonary nodule for 1 year although she is going to have more frequent imaging to ensure no evidence of metastatic melanoma.  We will follow her serial imaging.  I will see her in August to review.  If there is an interval change in size then we will talk about possible surgical resection versus other options for diagnostics.  I personally spent a total of 33 minutes in the care of the patient today including preparing to see the patient, getting/reviewing separately obtained history, performing a medically appropriate exam/evaluation, counseling and educating, placing orders, documenting clinical information in the EHR, and independently interpreting results.    Lamar Chris, MD, PhD 08/15/2024, 1:54 PM [facility] [phone] or if no answer before 7:00PM call [phone] For any issues after 7:00PM please call eLink [phone]

## 2024-08-15 NOTE — Assessment & Plan Note (Addendum)
 I have compared the reports from her many serial CT scans the chest going back to 2017.  Also have most recent images available for review.  Her left lower lobe ground glass pulmonary nodule appears to have been stable based on most of the radiology interpretations.  There may have been some slight increase in size over this time, but minimal.  Highly differentiated adenocarcinoma of the lung is still possible.  I think the most appropriate plan is to continue her surveillance.  She really does not need a repeat CT chest with regard to the ground glass pulmonary nodule for 1 year although she is going to have more frequent imaging to ensure no evidence of metastatic melanoma.  We will follow her serial imaging.  I will see her in August to review.  If there is an interval change in size then we will talk about possible surgical resection versus other options for diagnostics.

## 2024-08-20 ENCOUNTER — Encounter: Payer: 59 | Admitting: Nurse Practitioner

## 2024-08-22 LAB — COLOGUARD

## 2024-09-10 DIAGNOSIS — E890 Postprocedural hypothyroidism: Secondary | ICD-10-CM | POA: Diagnosis not present

## 2024-09-10 DIAGNOSIS — E782 Mixed hyperlipidemia: Secondary | ICD-10-CM | POA: Diagnosis not present

## 2024-09-17 ENCOUNTER — Other Ambulatory Visit (HOSPITAL_COMMUNITY): Payer: Self-pay

## 2024-09-17 MED ORDER — TRAZODONE HCL 50 MG PO TABS
50.0000 mg | ORAL_TABLET | Freq: Every evening | ORAL | 1 refills | Status: AC
  Filled 2024-09-17: qty 60, 30d supply, fill #0

## 2024-09-20 ENCOUNTER — Other Ambulatory Visit (HOSPITAL_COMMUNITY): Payer: Self-pay

## 2024-09-20 ENCOUNTER — Other Ambulatory Visit: Payer: Self-pay

## 2024-09-20 MED ORDER — ROSUVASTATIN CALCIUM 5 MG PO TABS
5.0000 mg | ORAL_TABLET | Freq: Every day | ORAL | 0 refills | Status: AC
  Filled 2024-09-20: qty 90, 90d supply, fill #0

## 2024-09-20 MED ORDER — SYNTHROID 112 MCG PO TABS
112.0000 ug | ORAL_TABLET | Freq: Every morning | ORAL | 0 refills | Status: AC
  Filled 2024-09-20: qty 90, 90d supply, fill #0

## 2024-09-20 MED ORDER — MONTELUKAST SODIUM 10 MG PO TABS
10.0000 mg | ORAL_TABLET | Freq: Every day | ORAL | 1 refills | Status: AC
  Filled 2024-09-20: qty 90, 90d supply, fill #0

## 2024-10-09 ENCOUNTER — Telehealth: Admitting: Emergency Medicine

## 2024-10-09 ENCOUNTER — Other Ambulatory Visit (HOSPITAL_COMMUNITY): Payer: Self-pay

## 2024-10-09 DIAGNOSIS — J019 Acute sinusitis, unspecified: Secondary | ICD-10-CM

## 2024-10-09 DIAGNOSIS — B9689 Other specified bacterial agents as the cause of diseases classified elsewhere: Secondary | ICD-10-CM | POA: Diagnosis not present

## 2024-10-09 MED ORDER — DOXYCYCLINE HYCLATE 100 MG PO TABS
100.0000 mg | ORAL_TABLET | Freq: Two times a day (BID) | ORAL | 0 refills | Status: AC
  Filled 2024-10-09: qty 14, 7d supply, fill #0

## 2024-10-09 NOTE — Progress Notes (Signed)
 "    E-Visit for Sinus Problems  We are sorry that you are not feeling well.  Here is how we plan to help!  Based on what you have shared with me it looks like you have sinusitis.  Sinusitis is inflammation and infection in the sinus cavities of the head.  Based on your presentation I believe you most likely have Acute Bacterial Sinusitis.  This is an infection caused by bacteria and is treated with antibiotics. I have prescribed Doxycycline  100mg  by mouth twice a day for 7 days.   You may use an oral decongestant such as Mucinex D or if you have glaucoma or high blood pressure use plain Mucinex.   Saline nasal spray help and can safely be used as often as needed for congestion.  Try using saline irrigation, such as with a neti pot, several times a day while you are sick. Many neti pots come with salt packets premeasured to use to make saline. If you use your own salt, make sure it is kosher salt or sea salt (don't use table salt as it has iodine in it and you don't need that in your nose). Use distilled water to make saline. If you mix your own saline using your own salt, the recipe is 1/4 teaspoon salt in 1 cup warm water. Using saline irrigation can help prevent and treat sinus infections.  If you develop worsening sinus pain, fever or notice severe headache and vision changes, or if symptoms are not better after completion of antibiotic, please schedule an appointment with a health care provider.    Sinus infections are not as easily transmitted as other respiratory infection, however we still recommend that you avoid close contact with loved ones, especially the very young and elderly.  Remember to wash your hands thoroughly throughout the day as this is the number one way to prevent the spread of infection!  Home Care: Only take medications as instructed by your medical team. Complete the entire course of an antibiotic. Do not take these medications with alcohol. A steam or ultrasonic  humidifier can help congestion.  You can place a towel over your head and breathe in the steam from hot water coming from a faucet. Avoid close contacts especially the very young and the elderly. Cover your mouth when you cough or sneeze. Always remember to wash your hands.  Get Help Right Away If: You develop worsening fever or sinus pain. You develop a severe head ache or visual changes. Your symptoms persist after you have completed your treatment plan.  Make sure you Understand these instructions. Will watch your condition. Will get help right away if you are not doing well or get worse.  Your e-visit answers were reviewed by a board certified advanced clinical practitioner to complete your personal care plan.  Depending on the condition, your plan could have included both over the counter or prescription medications.  If there is a problem please reply  once you have received a response from your provider.  Your safety is important to us .  If you have drug allergies check your prescription carefully.    You can use MyChart to ask questions about todays visit, request a non-urgent call back, or ask for a work or school excuse for 24 hours related to this e-Visit. If it has been greater than 24 hours you will need to follow up with your provider, or enter a new e-Visit to address those concerns.  You will get an e-mail in  the next two days asking about your experience.  I hope that your e-visit has been valuable and will speed your recovery. Thank you for using e-visits.  I have spent 5 minutes in review of e-visit questionnaire, review and updating patient chart, medical decision making and response to patient.   Jon Belt, PhD, FNP-BC      "
